# Patient Record
Sex: Male | Born: 1975 | Race: White | Hispanic: Yes | Marital: Married | Smoking: Never smoker
Health system: Southern US, Community
[De-identification: ages and names within clinical notes are randomized; demographics above are authoritative.]

## PROBLEM LIST (undated history)

## (undated) DIAGNOSIS — T8859XA Other complications of anesthesia, initial encounter: Secondary | ICD-10-CM

## (undated) DIAGNOSIS — T4145XA Adverse effect of unspecified anesthetic, initial encounter: Secondary | ICD-10-CM

## (undated) DIAGNOSIS — I10 Essential (primary) hypertension: Secondary | ICD-10-CM

## (undated) DIAGNOSIS — R7303 Prediabetes: Secondary | ICD-10-CM

## (undated) DIAGNOSIS — E785 Hyperlipidemia, unspecified: Secondary | ICD-10-CM

## (undated) DIAGNOSIS — K4090 Unilateral inguinal hernia, without obstruction or gangrene, not specified as recurrent: Secondary | ICD-10-CM

## (undated) HISTORY — PX: HERNIA REPAIR: SHX51

## (undated) HISTORY — DX: Essential (primary) hypertension: I10

---
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
## 2003-11-02 HISTORY — PX: INGUINAL HERNIA REPAIR: SHX194

## 2004-09-28 ENCOUNTER — Ambulatory Visit (HOSPITAL_BASED_OUTPATIENT_CLINIC_OR_DEPARTMENT_OTHER): Admission: RE | Admit: 2004-09-28 | Discharge: 2004-09-28 | Payer: Self-pay | Admitting: Surgery

## 2009-12-22 ENCOUNTER — Ambulatory Visit: Payer: Self-pay | Admitting: Physician Assistant

## 2009-12-22 DIAGNOSIS — B351 Tinea unguium: Secondary | ICD-10-CM | POA: Insufficient documentation

## 2009-12-22 DIAGNOSIS — L290 Pruritus ani: Secondary | ICD-10-CM

## 2010-01-02 ENCOUNTER — Encounter: Payer: Self-pay | Admitting: Physician Assistant

## 2010-01-02 LAB — CONVERTED CEMR LAB
Albumin: 4.5 g/dL (ref 3.5–5.2)
Barbiturate Quant, Ur: NEGATIVE
Basophils Relative: 0 % (ref 0–1)
CO2: 27 meq/L (ref 19–32)
Calcium: 9 mg/dL (ref 8.4–10.5)
Chloride: 102 meq/L (ref 96–112)
Creatinine,U: 92.1 mg/dL
Glucose, Bld: 88 mg/dL (ref 70–99)
Hemoglobin: 15.4 g/dL (ref 13.0–17.0)
Lymphocytes Relative: 37 % (ref 12–46)
MCHC: 33.8 g/dL (ref 30.0–36.0)
Methadone: NEGATIVE
Monocytes Absolute: 0.5 10*3/uL (ref 0.1–1.0)
Monocytes Relative: 7 % (ref 3–12)
Neutro Abs: 3.7 10*3/uL (ref 1.7–7.7)
Potassium: 4.1 meq/L (ref 3.5–5.3)
RBC: 5.22 M/uL (ref 4.22–5.81)
Sodium: 140 meq/L (ref 135–145)
TSH: 1.723 microintl units/mL (ref 0.350–4.500)
Total Protein: 6.9 g/dL (ref 6.0–8.3)

## 2010-02-03 ENCOUNTER — Ambulatory Visit: Payer: Self-pay | Admitting: Physician Assistant

## 2010-02-04 ENCOUNTER — Encounter: Payer: Self-pay | Admitting: Physician Assistant

## 2010-02-04 LAB — CONVERTED CEMR LAB
Cholesterol, target level: 200 mg/dL
Cholesterol: 212 mg/dL — ABNORMAL HIGH (ref 0–200)
HDL goal, serum: 40 mg/dL
LDL Goal: 160 mg/dL
VLDL: 39 mg/dL (ref 0–40)

## 2010-12-01 NOTE — Assessment & Plan Note (Signed)
Summary: NEW PT/ RECTUM PAIN/ GK   Vital Signs:  Patient profile:   35 year old male Height:      64 inches Weight:      172 pounds BMI:     29.63 Temp:     98.5 degrees F oral Pulse rate:   71 / minute Pulse rhythm:   regular Resp:     18 per minute BP sitting:   130 / 72  (left arm) Cuff size:   regular  Vitals Entered By: Arm[place] Shannon (December 22, 2009 4:28 PM) CC: NP...Marland KitchenMarland Kitchen pt is here for rectal pain.... pt says he has not seen any bleeding in his rectum...Marland KitchenMarland Kitchen pt says when he uses the bathroom he feels burning... pt says he has pain for two -three days...Marland Kitchen pt says he feels irration.... Is Patient Diabetic? No Pain Assessment Patient in pain? no       Does patient need assistance? Functional Status Self care Ambulation Normal   CC:  NP...Marland KitchenMarland Kitchen pt is here for rectal pain.... pt says he has not seen any bleeding in his rectum...Marland KitchenMarland Kitchen pt says when he uses the bathroom he feels burning... pt says he has pain for two -three days...Marland Kitchen pt says he feels irration.....  History of Present Illness: New patient. No prior medical care.  Here for anal itching and burning. Has noticed 4 x since Aug.   Did notice a "speck" of blood on his stool and on the tissue once. He denies feeling anything on his rectum.  Notices increased burning when he eats spicy foods.   Denies unintended weight loss. Denies night sweats or fevers. Does note some allergy symptoms.  Describes a cough when he lies down at times and notes increased drainage and sneezing.   Habits & Providers  Alcohol-Tobacco-Diet     Alcohol drinks/day: <1     Alcohol type: beer     Tobacco Status: quit     Year Quit: 2006     Pack years: < 1  Exercise-Depression-Behavior     Drug Use: never  Allergies (verified): No Known Drug Allergies  Past History:  Past Medical History: unremarkable  Past Surgical History: Right Inguinal Hernia repair  Family History: Family History Diabetes 1st degree relative - mom GM -  CAD no colon or prostate cancer  Social History: Occupation: Holiday representative Married 3 kids Former Smoker Occupation:  employed Smoking Status:  quit Drug Use:  never  Review of Systems  The patient denies fever, chest pain, syncope, dyspnea on exertion, prolonged cough, headaches, hemoptysis, melena, severe indigestion/heartburn, and hematuria.         Rest of ROS neg.  Physical Exam  General:  alert, well-developed, and well-nourished.   Head:  normocephalic and atraumatic.   Eyes:  pupils equal, pupils round, and pupils reactive to light.   pterygium noted bilat  Mouth:  pharynx pink and moist.   Neck:  supple and no cervical lymphadenopathy.   Lungs:  normal breath sounds, no crackles, and no wheezes.   Heart:  normal rate and regular rhythm.   Abdomen:  soft, non-tender, normal bowel sounds, and no hepatomegaly.   Rectal:  small hemorrhoid noted at 9 o'clock and 6 o'clock non inflammed no bleeding noted  Genitalia:  uncircumcised, no hydrocele, no varicocele, no scrotal masses, no testicular masses or atrophy, and no urethral discharge.   Prostate:  no gland enlargement, no nodules, and no asymmetry.   Msk:  onychomycosis noted right great toe Extremities:  no edema Neurologic:  alert & oriented X3 and cranial nerves II-XII intact.   Psych:  normally interactive.     Impression & Recommendations:  Problem # 1:  PRURITUS ANI (ICD-698.0)  suspect related to hemorrhoids very small on exam heme neg suggest increased fiber sitz baths as needed proctosol HC supp two times a day as needed   Orders: T-CBC w/Diff (09811-91478)  Problem # 2:  PREVENTIVE HEALTH CARE (ICD-V70.0)  Orders: T-CBC w/Diff ([id number]) T-Comprehensive Metabolic Panel (57846-96295) T-TSH [phone]) T-Drug Screen-Urine, (single) ([id number]) T-HIV Antibody  (Reflex) ([id number]) T-Syphilis Test (RPR) (38756-43329)  Complete Medication List: 1)  Anusol-hc 25 Mg Supp  (Hydrocortisone acetate) .Marland Kitchen.. 1 per rectum up to two times a day as needed  Patient Instructions: 1)  Increase fiber in diet. 2)  See handout. 3)  Use sitz bath as needed for burning or itching (sit in warm water in tub for 10-15 mins). 4)  Use suppositories as needed. 5)  Take antacid (pepcid or zantac) with spicy foods. 6)  Follow up sooner if you see more bleeding. 7)  Please schedule a follow-up appointment in 6 weeks with Scott.  8)  Come fasting to your next appointment for labs. Prescriptions: ANUSOL-HC 25 MG SUPP (HYDROCORTISONE ACETATE) 1 per rectum up to two times a day as needed  #1 box x 3   Entered and Authorized by:   Tereso Newcomer PA-C   Signed by:   Tereso Newcomer PA-C on 12/22/2009   Method used:   Print then Give to Patient   RxID:   [id number]   Prevention & Chronic Care Immunizations   Influenza vaccine: Not documented    Tetanus booster: Not documented    Pneumococcal vaccine: Not documented  Other Screening   Smoking status: quit  (12/22/2009)  Lipids   Total Cholesterol: Not documented   LDL: Not documented   LDL Direct: Not documented   HDL: Not documented   Triglycerides: Not documented

## 2010-12-01 NOTE — Letter (Signed)
Summary: Handout Printed  Printed Handout:  - Diet - High-Fiber 

## 2010-12-01 NOTE — Letter (Signed)
Summary: *HSN Results Follow up  [facility]  [address] [place], [place] [address]   Phone: [phone]  Fax: [phone]      01/02/2010   Austin Rubio [address] [place], [place]  [address]   Dear  Austin Rubio,                            ____S.Drinkard,FNP   ____D. Gore,FNP       ____B. McPherson,MD   ____V. Rankins,MD    ____E. Mulberry,MD    ____N. Daphine Deutscher, FNP  ____D. Reche Dixon, MD    ____K. Philipp Deputy, MD    __x__S. Alben Spittle, PA-C     This letter is to inform you that your recent test(s):  _______Pap Smear    ___x____Lab Test     _______X-ray    ___x____ is within acceptable limits  _______ requires a medication change  _______ requires a follow-up lab visit  _______ requires a follow-up visit with your provider   Comments:       _________________________________________________________ If you have any questions, please contact our office                     Sincerely,  Tereso Newcomer PA-C [facility]

## 2010-12-01 NOTE — Letter (Signed)
Summary: Lipid Letter  [facility]  [address] [place], [place] [address]   Phone: [phone]  Fax: [phone]    02/04/2010  Austin Rubio [address], [place]  [address]  Dear Austin Rubio:  We have carefully reviewed your last lipid profile from 02/03/2010 and the results are noted below with a summary of recommendations for lipid management.    Cholesterol:       212     Goal: <200   HDL "good" Cholesterol:   42     Goal: >40   LDL "bad" Cholesterol:   131     Goal: <160   Triglycerides:       196     Goal: <150    TLC Diet (Therapeutic Lifestyle Change): Saturated Fats & Transfatty acids should be kept < 7% of total calories ***Reduce Saturated Fats Polyunstaurated Fat can be up to 10% of total calories Monounsaturated Fat Fat can be up to 20% of total calories Total Fat should be no greater than 25-35% of total calories Carbohydrates should be 50-60% of total calories Protein should be approximately 15% of total calories Fiber should be at least 20-30 grams a day ***Increased fiber may help lower LDL Total Cholesterol should be < 200mg /day Consider adding plant stanol/sterols to diet (example: Benacol spread) ***A higher intake of unsaturated fat may reduce Triglycerides and Increase HDL    Adjunctive Measures (may lower LIPIDS and reduce risk of Heart Attack) include: Aerobic Exercise (20-30 minutes 3-4 times a week) Limit Alcohol Consumption Weight Reduction Dietary Fiber 20-30 grams a day by mouth    Current Medications: 1)    Anusol-hc 25 Mg Supp (Hydrocortisone acetate) .Marland Kitchen.. 1 per rectum up to two times a day as needed 2)    Lamisil 250 Mg Tabs (Terbinafine hcl) .... Take 1 tablet by mouth once a day for nail fungus (please write in spanish)  Please start taking Fish Oil 1000 mg daily and repeat labs in 6 months.  If you have any questions, please call.   Sincerely,    Tereso Newcomer, PA-C

## 2010-12-01 NOTE — Assessment & Plan Note (Signed)
Summary: 6 week f/u fasting/tmm   Vital Signs:  Patient profile:   35 year old male Height:      64 inches Weight:      173 pounds Temp:     97.7 degrees F oral Pulse rate:   62 / minute Pulse rhythm:   regular Resp:     18 per minute BP sitting:   125 / 78  (left arm) Cuff size:   regular  Vitals Entered By: Arm[place] Shannon (February 03, 2010 9:48 AM) CC: six week f/u.... Is Patient Diabetic? No Pain Assessment Patient in pain? no       Does patient need assistance? Functional Status Self care Ambulation Normal   Primary Care Provider:  Tereso Newcomer, PA-C  CC:  six week f/u.....  History of Present Illness: Here for f/u.  Pruritus ani:  Resolved.  Increased his fiber.  No bleeding.  Has not had to use suppository.  Onychomycosis: Right great toenail.  Interested in taking medicine.  Allergies:  Seasonal.  Taking OTC zyrtec with relief.    Habits & Providers  Alcohol-Tobacco-Diet     Alcohol drinks/day: 4-5 drinks on weekend     Alcohol type: beer  Problems Prior to Update: 1)  Onychomycosis  (ICD-110.1) 2)  Preventive Health Care  (ICD-V70.0) 3)  Pruritus Ani  (ICD-698.0) 4)  Family History Diabetes 1st Degree Relative  (ICD-V18.0)  Allergies (verified): No Known Drug Allergies  Physical Exam  General:  alert, well-developed, and well-nourished.   Head:  normocephalic and atraumatic.   Lungs:  normal breath sounds.   Heart:  normal rate and regular rhythm.   Extremities:  right foot: right great toenail and fifth toenail thickened and yellow c/w onychomycosis Neurologic:  alert & oriented X3 and cranial nerves II-XII intact.   Psych:  normally interactive.     Impression & Recommendations:  Problem # 1:  ONYCHOMYCOSIS (ICD-110.1)  start lamisil check LFTs in 6-8 weeks  His updated medication list for this problem includes:    Lamisil 250 Mg Tabs (Terbinafine hcl) .Marland Kitchen... Take 1 tablet by mouth once a day for nail fungus (please write in  spanish)  Problem # 2:  PRURITUS ANI (ICD-698.0) resolved  Problem # 3:  PREVENTIVE HEALTH CARE (ICD-V70.0)  get FLP today  Orders: T-Lipid Profile ([id number])  Complete Medication List: 1)  Anusol-hc 25 Mg Supp (Hydrocortisone acetate) .Marland Kitchen.. 1 per rectum up to two times a day as needed 2)  Lamisil 250 Mg Tabs (Terbinafine hcl) .... Take 1 tablet by mouth once a day for nail fungus (please write in spanish)  Patient Instructions: 1)  Td shot today. 2)  Return to the lab in 8 weeks for LFTs (Dx 110.1). 3)  Do not drink more than one beer a day while taking this medication.  It is best to not have a drink every day. Prescriptions: LAMISIL 250 MG TABS (TERBINAFINE HCL) Take 1 tablet by mouth once a day for nail fungus (please write in Spanish)  #30 x 2   Entered and Authorized by:   Tereso Newcomer PA-C   Signed by:   Tereso Newcomer PA-C on 02/03/2010   Method used:   Print then Give to Patient   RxID:   [id number]

## 2011-03-19 NOTE — Op Note (Signed)
NAME:  Austin Rubio, Austin Rubio                    ACCOUNT NO.:  [id number]   MEDICAL RECORD NO.:  [id number]          PATIENT TYPE:  AMB   LOCATION:  [facility]                          FACILITY:  [facility]   PHYSICIAN:  Thornton Park. Daphine Deutscher, MD  DATE OF BIRTH:  Sep 06, 1976   DATE OF PROCEDURE:  09/28/2004  DATE OF DISCHARGE:                                 OPERATIVE REPORT   PREOPERATIVE DIAGNOSIS:  Right inguinal hernia.   POSTOPERATIVE DIAGNOSIS:  Right direct inguinal hernia related to lifting  mortar bucket at work.   PROCEDURE:  Right inguinal herniorrhaphy with mesh.   SURGEON:  Thornton Park. Daphine Deutscher, MD   ANESTHESIA:  General.   DESCRIPTION OF PROCEDURE:  This is a 35 year old Hispanic male who sustained  the above-mentioned injury.  Informed consent was obtained was obtained  regarding repair of his inguinal hernia through a translator.  After  prepping the right inguinal region with Betadine and draping sterilely, an  oblique incision was made and carried down to the external oblique.  This  was cleaned down to the ring when opened.  The cord was mobilized with a  Penrose drain placed around it.  A very large direct defect was present,  occupying the entire floor.  This was closed by imbricating the  transversalis fascia with a running 2-0 Prolene.  Once this was completed, a  piece of mesh was cut to fit and sutured along the inguinal ligament with a  running 2-0 Prolene and medially to the internal oblique with running 2-0  Prolene.  It was cut and brought around the cord and anchored to itself with  a single horizontal mattress suture of 2-0 Prolene.  This was tucked beneath  the external oblique, which was then closed with a running 2-0 Vicryl.  The  area was then infiltrated with lidocaine and was closed with 4-0 Vicryl and  with a running subcuticular 5-0 Vicryl.  The patient seemed to tolerate the  procedure well.  He will be given Tylox to take for pain.  He will be  followed up in the office  in about three weeks.      Matt   MBM/MEDQ  D:  09/28/2004  T:  09/28/2004  Job:  [id number]   cc:   Dr. Jill Alexanders, [facility]

## 2011-04-06 ENCOUNTER — Inpatient Hospital Stay (INDEPENDENT_AMBULATORY_CARE_PROVIDER_SITE_OTHER)
Admission: RE | Admit: 2011-04-06 | Discharge: 2011-04-06 | Disposition: A | Discharge status: Home / Self Care | Payer: Self-pay | Source: Ambulatory Visit | Attending: Family Medicine | Admitting: Family Medicine

## 2011-04-06 DIAGNOSIS — F411 Generalized anxiety disorder: Secondary | ICD-10-CM

## 2011-04-06 DIAGNOSIS — F4321 Adjustment disorder with depressed mood: Secondary | ICD-10-CM

## 2011-09-30 ENCOUNTER — Encounter: Payer: Self-pay | Admitting: Emergency Medicine

## 2011-09-30 ENCOUNTER — Emergency Department (INDEPENDENT_AMBULATORY_CARE_PROVIDER_SITE_OTHER)
Admission: EM | Admit: 2011-09-30 | Discharge: 2011-09-30 | Disposition: A | Discharge status: Home / Self Care | Payer: Self-pay | Source: Home / Self Care | Attending: Family Medicine | Admitting: Family Medicine

## 2011-09-30 DIAGNOSIS — F419 Anxiety disorder, unspecified: Secondary | ICD-10-CM

## 2011-09-30 DIAGNOSIS — F411 Generalized anxiety disorder: Secondary | ICD-10-CM

## 2011-09-30 DIAGNOSIS — F329 Major depressive disorder, single episode, unspecified: Secondary | ICD-10-CM

## 2011-09-30 MED ORDER — ALPRAZOLAM 1 MG PO TABS: 1.0000 mg | ORAL_TABLET | Freq: Two times a day (BID) | ORAL | Status: DC | PRN

## 2011-09-30 NOTE — ED Notes (Signed)
Pt here with c/o weakness,fatigue and dizziness and inability to sleep for the past week and dry mouth,sweaty palms and neck pain.pt was seen here for same sx and prescribed xanax but states it helps me sleep but sx restarts again.

## 2011-09-30 NOTE — ED Provider Notes (Signed)
History     CSN: [id number] Arrival date & time: 09/30/2011  6:36 PM   First MD Initiated Contact with Patient 09/30/11 1655      Chief Complaint  Patient presents with  . Dizziness  . Fatigue  . New Evaluation    (Consider location/radiation/quality/duration/timing/severity/associated sxs/prior treatment) HPI Comments: Austin Rubio presents with his family for evaluation of intermittent dizziness, sweaty palms, decreased sleep, fatigue. He reports onset of symptoms in June when he lost his father, was given Xanax, which helped some. His daughter is interpreting. She reports that he also complains of crying spells, especially when the family leaves the house. He is afraid of being alone. He also gets nervous when there are too many people around him.   Patient is a 35 y.o. male presenting with neurologic complaint. The history is provided by the patient and a relative. The history is limited by a language barrier. A language interpreter was used.  Neurologic Problem The primary symptoms include dizziness. The symptoms are unchanged. The symptoms occurred after standing up (depression since the death of his father).  He describes the dizziness as faintness. The dizziness began more than 1 week ago. The dizziness has been unchanged since its onset. The dizziness is associated with emotional stress. Dizziness also occurs with blurred vision and diaphoresis.  Additional symptoms include anxiety and dysphoric mood.    History reviewed. No pertinent past medical history.  History reviewed. No pertinent past surgical history.  History reviewed. No pertinent family history.  History  Substance Use Topics  . Smoking status: Never Smoker   . Smokeless tobacco: Not on file  . Alcohol Use: No      Review of Systems  Constitutional: Positive for diaphoresis.  HENT: Negative.   Eyes: Positive for blurred vision.  Respiratory: Negative.   Cardiovascular: Negative.   Gastrointestinal:  Negative.   Genitourinary: Negative for dysuria, urgency and frequency.  Musculoskeletal: Negative.   Neurological: Positive for dizziness.  Psychiatric/Behavioral: Positive for sleep disturbance, dysphoric mood, decreased concentration and agitation. The patient is nervous/anxious.     Allergies  Review of patient's allergies indicates no known allergies.  Home Medications   Current Outpatient Rx  Name Route Sig Dispense Refill  . ALPRAZOLAM 1 MG PO TABS Oral Take 1 mg by mouth at bedtime as needed.        BP 172/87  Pulse 77  Temp(Src) 98.2 F (36.8 C) (Oral)  Resp 18  SpO2 100%  Physical Exam  Nursing note and vitals reviewed. Constitutional: He is oriented to person, place, and time. He appears well-developed and well-nourished.  HENT:  Head: Normocephalic and atraumatic.  Right Ear: Tympanic membrane and external ear normal.  Left Ear: Tympanic membrane and external ear normal.  Mouth/Throat: Uvula is midline, oropharynx is clear and moist and mucous membranes are normal.  Eyes: EOM are normal. Pupils are equal, round, and reactive to light.  Neck: Normal range of motion.  Cardiovascular: Normal rate and regular rhythm.   Pulmonary/Chest: Effort normal and breath sounds normal.  Abdominal: Soft.  Musculoskeletal: Normal range of motion.  Neurological: He is alert and oriented to person, place, and time. He displays a negative Romberg sign. Gait normal.  Skin: Skin is warm and dry.  Psychiatric: He has a normal mood and affect. His behavior is normal. Judgment and thought content normal.    ED Course  Procedures (including critical care time)   Labs Reviewed  POCT CBG MONITORING   No results found.   No  diagnosis found.    MDM          Richardo Priest, MD 09/30/11 [phone]

## 2012-01-30 ENCOUNTER — Emergency Department (HOSPITAL_COMMUNITY)
Admission: EM | Admit: 2012-01-30 | Discharge: 2012-01-30 | Disposition: A | Discharge status: Home / Self Care | Payer: Self-pay | Source: Home / Self Care

## 2012-01-30 ENCOUNTER — Encounter (HOSPITAL_COMMUNITY): Payer: Self-pay

## 2012-01-30 DIAGNOSIS — G5600 Carpal tunnel syndrome, unspecified upper limb: Secondary | ICD-10-CM

## 2012-01-30 DIAGNOSIS — J309 Allergic rhinitis, unspecified: Secondary | ICD-10-CM

## 2012-01-30 DIAGNOSIS — L723 Sebaceous cyst: Secondary | ICD-10-CM

## 2012-01-30 LAB — POCT RAPID STREP A: Streptococcus, Group A Screen (Direct): NEGATIVE

## 2012-01-30 NOTE — ED Notes (Signed)
Pt has sorethroat for one month with headache.  He also has tender dime size spot on lt chest for 6 months.

## 2012-01-30 NOTE — ED Provider Notes (Signed)
Medical screening examination/treatment/procedure(s) were performed by non-physician practitioner and as supervising physician I was immediately available for consultation/collaboration.  Raynald Blend, MD 01/30/12 2025

## 2012-01-30 NOTE — Discharge Instructions (Signed)
Use warm compresses to chest several times a day to help heal the cyst.    Use over the counter CLARITIN (or LORATIDINE) one pill every day to help relieve your sore throat, seasonal congestion.    Use the wrist splint when working and sleeping and whenever your arm feels numb.

## 2012-01-30 NOTE — ED Provider Notes (Signed)
History     CSN: [id number]  Arrival date & time 01/30/12  1707   None     Chief Complaint  Patient presents with  . Sore Throat    (Consider location/radiation/quality/duration/timing/severity/associated sxs/prior treatment) HPI Comments: Pt with multiple complaints.  First, has sore throat for a month, similar sx have happened in the past, associated with weather season changes.  Worst at night, not bad during day.  Also c/o numbness in L hand/forearm from time to time.  Sometimes when working, sometimes at night when sleeping, sometimes at other times of day.  Feels like arm/hand is "dormido" or asleep.  Also complains of lesion on chest for 6 months, unchanged, doesn't hurt unless it's pushed on.    Patient is a 36 y.o. male presenting with pharyngitis.  Sore Throat This is a new problem. Episode onset: a month ago. The problem occurs daily. The problem has not changed since onset.Pertinent negatives include no chest pain and no shortness of breath. The symptoms are aggravated by nothing. The symptoms are relieved by nothing. He has tried nothing for the symptoms.    History reviewed. No pertinent past medical history.  Past Surgical History  Procedure Date  . Hernia repair     History reviewed. No pertinent family history.  History  Substance Use Topics  . Smoking status: Never Smoker   . Smokeless tobacco: Not on file  . Alcohol Use: No      Review of Systems  Constitutional: Negative for fever, chills and appetite change.  HENT: Positive for congestion, sore throat, rhinorrhea, postnasal drip and sinus pressure. Negative for ear pain.   Respiratory: Negative for cough and shortness of breath.   Cardiovascular: Negative for chest pain.  Musculoskeletal: Negative for myalgias and joint swelling.       No joint pain  Skin: Negative for rash and wound.       Spot on chest  Neurological: Positive for numbness.       Wrist/hand/forearm "asleep feeling" sometimes.       Allergies  Review of patient's allergies indicates no known allergies.  Home Medications   Current Outpatient Rx  Name Route Sig Dispense Refill  . ALPRAZOLAM 1 MG PO TABS Oral Take 1 tablet (1 mg total) by mouth 2 (two) times daily as needed for sleep or anxiety. 30 tablet 0    BP 142/86  Pulse 69  Temp(Src) 98.4 F (36.9 C) (Oral)  Resp 18  SpO2 96%  Physical Exam  Constitutional: He appears well-developed and well-nourished. No distress.  HENT:  Head: Macrocephalic.  Right Ear: Tympanic membrane, external ear and ear canal normal.  Left Ear: Tympanic membrane, external ear and ear canal normal.  Nose: Mucosal edema and rhinorrhea present.  Mouth/Throat: Oropharynx is clear and moist.  Cardiovascular: Normal rate and regular rhythm.   Pulmonary/Chest: Effort normal and breath sounds normal. He exhibits mass. He exhibits no tenderness.       See skin exam  Lymphadenopathy:       Head (right side): No submental, no submandibular and no tonsillar adenopathy present.       Head (left side): No submental, no submandibular and no tonsillar adenopathy present.    He has no cervical adenopathy.  Neurological:       postive tinnel's sign L hand/forearm, c/w carpal tunnel  Skin: Skin is warm and dry.       Small 4-2mm nodule chest wall, nontender, no redness or warmth, firm to palp, c/w sebaceous cyst.  ED Course  Procedures (including critical care time)   Labs Reviewed  POCT RAPID STREP A ([facility] CARE ONLY)   No results found.   1. Carpal tunnel syndrome   2. Sebaceous cyst   3. Allergic rhinitis       MDM          Cathlyn Parsons, NP 01/30/12 2025

## 2013-05-17 ENCOUNTER — Ambulatory Visit: Payer: No Typology Code available for payment source | Attending: Family Medicine | Admitting: Family Medicine

## 2013-05-17 VITALS — BP 184/116 | HR 67 | Temp 98.8°F | Resp 16 | Ht 65.0 in | Wt 197.0 lb

## 2013-05-17 DIAGNOSIS — K5909 Other constipation: Secondary | ICD-10-CM

## 2013-05-17 DIAGNOSIS — K649 Unspecified hemorrhoids: Secondary | ICD-10-CM

## 2013-05-17 DIAGNOSIS — K5900 Constipation, unspecified: Secondary | ICD-10-CM

## 2013-05-17 DIAGNOSIS — I10 Essential (primary) hypertension: Secondary | ICD-10-CM | POA: Insufficient documentation

## 2013-05-17 DIAGNOSIS — K648 Other hemorrhoids: Secondary | ICD-10-CM | POA: Insufficient documentation

## 2013-05-17 HISTORY — DX: Essential (primary) hypertension: I10

## 2013-05-17 MED ORDER — HYDROCORTISONE ACETATE 25 MG RE SUPP: 25.0000 mg | Freq: Two times a day (BID) | RECTAL | Status: DC | PRN

## 2013-05-17 MED ORDER — HYDROCHLOROTHIAZIDE 12.5 MG PO TABS: 12.5000 mg | ORAL_TABLET | Freq: Every day | ORAL | Status: DC

## 2013-05-17 MED ORDER — POLYETHYLENE GLYCOL 3350 17 GM/SCOOP PO POWD: 17.0000 g | Freq: Two times a day (BID) | ORAL | Status: DC | PRN

## 2013-05-17 NOTE — Progress Notes (Signed)
Patient presents to establish care with complaints of hemorrhoidal pain for the past 2 years; pain has gotten worse and patient states he has been taking his anti anxiety medication, Xanax, to cope with the pain. States he was given a suppository when at his last clinic over a year ago but has run out.

## 2013-05-17 NOTE — Patient Instructions (Addendum)
Constipacin - Adulto  (Constipation, Adult)  Constipacin significa que una persona tiene menos de 3 evacuaciones en una semana, hay dificultad para evacuar el intestino, o las heces son secas, duras, o ms grandes que lo normal. A medida que envejecemos el estreimiento es ms comn. Si intenta curar el estreimiento con medicamentos que producen la evacuacin intestinal (laxantes), el problema puede empeorar. El uso prolongado de laxantes puede hacer que los msculos del colon se debiliten. Una dieta baja en fibra, no tomar suficientes lquidos y el uso de ciertos medicamentos pueden Agricultural engineer.  CAUSAS   Ciertos medicamentos, como los antidepresivos, analgsicos, suplementos de hierro, anticidos y diurticos.   Algunas enfermedades, como la diabetes, el sndrome del colon irritable (SII), enfermedad de la tiroides, o depresin.   No beber suficiente agua.   No consumir suficientes alimentos ricos en fibra.   Situaciones de estrs o viajes.  Falta de actividad fsica o de ejercicio.  No ir al bao cuando siente la necesidad.  Ignorar la necesidad sbita de Film/video editor intestino.  Uso en exceso de laxantes. SNTOMAS   Evacuar el intestino menos de 3 veces a la semana.   Dificultad para mover el [place] y duras, o ms grandes que las normales.   Sensacin de estar lleno o distendido.   Dolor en la parte baja del abdomen  No se siente alivio despus de evacuar el intestino. DIAGNSTICO  El mdico le har una historia clnica y le har un examen fsico. Pueden hacerle exmenes adicionales para el estreimiento grave. Algunas pruebas son:   Un radiografa con enema de bario para examinar el recto, el colon y en algunos casos el intestino delgado.  Una sigmoidoscopia para examinar el colon inferior.  Una colonoscopia para examinar todo el colon. TRATAMIENTO  El tratamiento depender de la gravedad de la constipacin y de la  causa. Algunos tratamientos dietticos son beber ms lquidos y comer ms alimentos ricos en fibra. El cambio en el estilo de vida incluye hacer ejercicios de [place] regular. Si estas recomendaciones para Animator dieta y en el estilo de vida no ayudan, el mdico le puede indicar el uso de laxantes de venta libre para Industrial/product designer movimiento intestinal. Los medicamentos con Statistician se pueden prescribir si los medicamentos de venta libre no lo mejoran.  INSTRUCCIONES PARA EL CUIDADO EN EL HOGAR   Aumente el consumo de alimentos con [place], como frutas, verduras, granos enteros y frijoles. Limite los azcares ricos en grasas y procesados   en su dieta, tales como papas fritas, hamburguesas, galletas, dulces y refrescos.   Puede agregar un suplemento de fibra a su dieta si no obtiene lo suficiente de los alimentos.   Debe ingerir gran cantidad de lquido para mantener la orina de tono claro o color amarillo plido.   Haga ejercicios regularmente o segn las indicaciones de su mdico.   Vaya al bao cuando sienta la necesidad de ir. No espere.  Tome slo la medicacin que le indic el profesional.  No tome otros medicamentos para la constipacin sin Teacher, adult education a su mdico. SOLICITE ATENCIN MDICA DE INMEDIATO SI:   Observa sangre brillante en las heces.   La constipacin dura ms de 4 das o empeora.   Siente dolor abdominal o rectal.   Las heces son delgadas como un lpiz.  Pierde peso de [place] inexplicable. ASEGRESE DE QUE:   Comprende estas instrucciones.  Controlar su enfermedad.  Solicitar ayuda de inmediato si  no mejora o empeora. Document Released: 11/07/2007 Document Revised: 04/18/2012 [facility] Patient Information 2014 [place], [place].  Hemorroides  (Hemorrhoids) Las hemorroides son venas inflamadas alrededor del recto o del ano. Hay dos tipos de hemorroides:   Hemorroides internas. Aparecen en las venas del interior del recto. Pueden abultarse hacia  el exterior e irritarse y Cabin crew.  Hemorroides externas. Se producen en las venas externas al ano y pueden sentirse como un bulto o zona hinchada dura y dolorosa cerca del ano. CAUSAS   Embarazo.   Obesidad.   Constipacin o diarrea.   Dificultad para mover el intestino.   Permanecer sentado durante largos perodos en el inodoro.  Levantar pesas u otras actividades que impliquen esfuerzo.  Sexo anal. SNTOMAS   Dolor.   Picazn o irritacin anal.   Sangrado rectal.   Prdida fecal.   Hinchazn anal.   Uno o ms bultos en la zona anal.  DIAGNSTICO  El mdico puede diagnosticar las hemorroides mediante un examen visual. Otros estudios o anlisis que se pueden realizar son:   Examen de la zona rectal con [place]om[place] Dear mano enguantada (examen digital rectal).   Examen de canal anal usando un pequeo tubo (endoscopio).   Anlisis de sangre si ha perdido [place] cantidad significativa de [place].  Estudio para observar el interior del colon (sigmoidoscopa o colonoscopa). TRATAMIENTO  La mayora de las hemorroides pueden tratarse en casa. Sin embargo, si los sntomas no mejoran o tiene Runner, broadcasting/film/video, el mdico puede Education officer, environmental un procedimiento para disminuir las hemorroides o extirparlas completamente. Los tratamientos posibles son:   Colocacin de una banda de goma en la base de la hemorroide para cortar la circulacin (ligadura con Curator).   Inyeccin de una sustancia qumica para reducir la hemorroide (escleroterapia).   Utilizacin de un instrumento para quemar la hemorroide (terapia con luz infrarroja).   Extirpacin quirrgica de las hemorroides (hemorroidectoma).   Colocacin de grapas en la hemorroide para bloquear el flujo de sangre a los tejidos (engrapado de hemorroides).  INSTRUCCIONES PARA EL CUIDADO EN EL HOGAR   Consuma alimentos con fibra, como cereales integrales, legumbres, frutos secos, frutas y verduras. Pregntele a su mdico acerca  de tomar productos con fibra aadida en ellos (suplementos defibra).  Aumente la ingesta de lquidos. Beba gran cantidad de lquido para mantener la orina de tono claro o color amarillo plido.   Haga ejercicios regularmente.   Vaya al bao cuando sienta la necesidad de mover el intestino. No espere.   Evite hacer fuerza al mover el intestino.   Mantenga la zona anal limpia y seca. Use papel higinico hmedo o toallitas humedecidas despus de mover el intestino.   Puede usar o Contractor segn las indicaciones algunas cremas especiales y supositorios.   Tome slo medicamentos de venta libre o recetados, segn las indicaciones del mdico.   Tome baos de asiento tibios durante 15 a 20 minutos, 3 a 4 veces por da para Primary school teacher y las [place].   Coloque una bolsa de hielo sobre las hemorroides si le duelen o se hinchan. Usar las compresas de [employer] baos de asiento puede ser [place].   Ponga el hielo en una bolsa plstica.   Colquese una toalla entre la piel y la bolsa de hielo.   Deje el hielo durante 15 a 20 minutos y aplquelo 3 a 4 veces por da.   No utilice una almohada en forma de aro ni se siente en el inodoro durante perodos prolongados. Esto aumenta la afluencia  de sangre y Chief Technology Officer.  SOLICITE ATENCIN MDICA SI:   Aumenta el dolor y la hinchazn y no puede controlarlo con la medicacin o con Pharmacist, community.  Tiene un sangrado que no puede parar.  No puede mover el intestino.  Siente dolor o tiene inflamacin fuera de la zona de las hemorroides. ASEGRESE DE QUE:   Comprende estas instrucciones.  Controlar su enfermedad.  Solicitar ayuda de inmediato si no mejora o si empeora. Document Released: 10/18/2005 Document Revised: 10/04/2012 [facility] Patient Information 2014 [place], [place].  Bao de Asiento   Un bao de asiento es un bao de agua caliente tomado en posicin de sentado. El agua debe cubrir las caderas y las nalgas.  Puede utilizarse para propsitos de higiene o curacin. Los baos de asiento a menudo se Insurance risk surveyor para Engineer, materials, picazn o espasmos musculares. El agua podr contener [employer]. El calor hmedo lo ayudar a curarse y relajarse.  CUIDADOS EN EL HOGAR  Tome de 3 a 4 baos de Insurance claims handler.  1. Llene la baadera hasta la mitad con agua caliente. 2. Sintese en el agua y abra un poco el desage. 3. [place] agua caliente para mantener la baadera llena hasta la mitad. Deje el agua corriendo de Pandora constante. 4. Sumrjase en el agua por 15 a 20 minutos. 5. Luego del bao de asiento, seque primero la parte afectada. SOLICITE AYUDA DE INMEDIATO SI:  Empeora en lugar de mejorar. Suspenda los baos de asiento si ve que [place].  ASEGRESE DE QUE:   Comprende estas instrucciones.  Controlar su enfermedad.  Solicitar ayuda de inmediato si no mejora o si empeora. Document Released: 03/22/2011 Document Revised: 07/12/2012 [facility] Patient Information 2014 [place], [place].

## 2013-05-17 NOTE — Progress Notes (Signed)
Patient ID: Austin Rubio, male   DOB: May 18, 1976, 37 y.o.   MRN: [id number]  CC: establish  Interpreter used to speak with patient today  HPI: Pt having hemorrhoid problem for several years and constipation.  Occasional blood with a BM.   No Known Allergies Past Medical History  Diagnosis Date  . Unspecified essential hypertension 05/17/2013   Current Outpatient Prescriptions on File Prior to Visit  Medication Sig Dispense Refill  . ALPRAZolam (XANAX) 1 MG tablet Take 1 tablet (1 mg total) by mouth 2 (two) times daily as needed for sleep or anxiety.  30 tablet  0   No current facility-administered medications on file prior to visit.   Family History  Problem Relation Age of Onset  . Diabetes Mother   . Heart disease Father    History   Social History  . Marital Status: Married    Spouse Name: N/A    Number of Children: N/A  . Years of Education: N/A   Occupational History  . Not on file.   Social History Main Topics  . Smoking status: Never Smoker   . Smokeless tobacco: Not on file  . Alcohol Use: No  . Drug Use: No  . Sexually Active: Not on file   Other Topics Concern  . Not on file   Social History Narrative  . No narrative on file    Review of Systems  Constitutional: Negative for fever, chills, diaphoresis, activity change, appetite change and fatigue.  HENT: Negative for ear pain, nosebleeds, congestion, facial swelling, rhinorrhea, neck pain, neck stiffness and ear discharge.   Eyes: Negative for pain, discharge, redness, itching and visual disturbance.  Respiratory: Negative for cough, choking, chest tightness, shortness of breath, wheezing and stridor.   Cardiovascular: Negative for chest pain, palpitations and leg swelling.  Gastrointestinal: Negative for abdominal distention.  Genitourinary: Negative for dysuria, urgency, frequency, hematuria, flank pain, decreased urine volume, difficulty urinating and dyspareunia.  Musculoskeletal: Negative for  back pain, joint swelling, arthralgias and gait problem.  Neurological: Negative for dizziness, tremors, seizures, syncope, facial asymmetry, speech difficulty, weakness, light-headedness, numbness and headaches.  Hematological: Negative for adenopathy. Does not bruise/bleed easily.  Psychiatric/Behavioral: Negative for hallucinations, behavioral problems, confusion, dysphoric mood, decreased concentration and agitation.    Objective:   Filed Vitals:   05/17/13 1609  BP: 182/100  Pulse: 67  Temp: 98.8 F (37.1 C)  Resp: 16    Physical Exam  Constitutional: Appears well-developed and well-nourished. No distress.  HENT: Normocephalic. External right and left ear normal. Oropharynx is clear and moist.  Eyes: Conjunctivae and EOM are normal. PERRLA, no scleral icterus.  Neck: Normal ROM. Neck supple. No JVD. No tracheal deviation. No thyromegaly.  CVS: RRR, S1/S2 +, no murmurs, no gallops, no carotid bruit.  Pulmonary: Effort and breath sounds normal, no stridor, rhonchi, wheezes, rales.  Abdominal: Soft. BS +,  no distension, tenderness, rebound or guarding. Rectal: no external hemorrhoids seen, palpated painful swollen internal hemorrhoid, nonthrombosed  Musculoskeletal: Normal range of motion. No edema and no tenderness.  Lymphadenopathy: No lymphadenopathy noted, cervical, inguinal. Neuro: Alert. Normal reflexes, muscle tone coordination. No cranial nerve deficit. Skin: Skin is warm and dry. No rash noted. Not diaphoretic. No erythema. No pallor.  Psychiatric: Normal mood and affect. Behavior, judgment, thought content normal.   Lab Results  Component Value Date   WBC 7.0 12/22/2009   HGB 15.4 12/22/2009   HCT 45.5 12/22/2009   MCV 87.2 12/22/2009   PLT 276 12/22/2009  Lab Results  Component Value Date   CREATININE 1.13 12/22/2009   BUN 20 12/22/2009   NA 140 12/22/2009   K 4.1 12/22/2009   CL 102 12/22/2009   CO2 27 12/22/2009    No results found for this basename: HGBA1C    Lipid Panel     Component Value Date/Time   CHOL 212* 02/03/2010 2239   TRIG 196* 02/03/2010 2239   HDL 42 02/03/2010 2239   CHOLHDL 5.0 Ratio 02/03/2010 2239   VLDL 39 02/03/2010 2239   LDLCALC 131* 02/03/2010 2239       Assessment and plan:   Patient Active Problem List   Diagnosis Date Noted  . Unspecified essential hypertension 05/17/2013  . Chronic constipation 05/17/2013  . Hemorrhoids 05/17/2013  . ONYCHOMYCOSIS 12/22/2009  . PRURITUS ANI 12/22/2009   Internal hemorrhoid, nonthrombosed Recommend sitz baths, Anusol HC suppositories  Chronic constipation  miralax daily  Unspecified essential hypertension - Plan: COMPLETE METABOLIC PANEL WITH GFR, HgB A1c, Lipid panel  Chronic constipation - Plan: COMPLETE METABOLIC PANEL WITH GFR, HgB A1c, Lipid panel  Hemorrhoids - Plan: COMPLETE METABOLIC PANEL WITH GFR, HgB A1c, Lipid panel  RTC in 1 month  The patient was given clear instructions to go to ER or return to medical center if symptoms don't improve, worsen or new problems develop.  The patient verbalized understanding.  The patient was told to call to get any lab results if not heard anything in the next week.    Rodney Langton, MD, CDE, FAAFP [facility] [place], [place]

## 2013-05-18 ENCOUNTER — Telehealth: Payer: Self-pay | Admitting: *Deleted

## 2013-05-18 LAB — COMPLETE METABOLIC PANEL WITH GFR
ALT: 58 U/L — ABNORMAL HIGH (ref 0–53)
Alkaline Phosphatase: 79 U/L (ref 39–117)
GFR, Est Non African American: >89 mL/min
Sodium: 137 mEq/L (ref 135–145)
Total Bilirubin: 0.5 mg/dL (ref 0.3–1.2)
Total Protein: 7.4 g/dL (ref 6.0–8.3)

## 2013-05-18 LAB — LIPID PANEL
HDL: 39 mg/dL — ABNORMAL LOW (ref >39)
LDL Cholesterol: 116 mg/dL — ABNORMAL HIGH (ref 0–99)
Triglycerides: 275 mg/dL — ABNORMAL HIGH (ref <150)
VLDL: 55 mg/dL — ABNORMAL HIGH (ref 0–40)

## 2013-05-18 NOTE — Progress Notes (Signed)
Quick Note:  Please inform patient that his labs reveal that his cholesterol levels are elevated. I recommend a low fat low cholesterol diet and exercise 5 times per week. Also, his liver enzyme was mildly elevated. REcommend rechecking labs again in 4 months.   Rodney Langton, MD, CDE, FAAFP [facility] [place], [place]   ______

## 2013-05-18 NOTE — Telephone Encounter (Signed)
05/18/13 Patient made aware of lab results cholesterol level elevated. Recommend low fat low cholesterol diet. Patient made aware to rxercise 5 times per week. Liver enzymes mildly elevated. Recommend recheck in 4 months. P.Chanice Brenton,RN BSN [facility]

## 2013-06-22 ENCOUNTER — Ambulatory Visit: Payer: No Typology Code available for payment source | Attending: Family Medicine | Admitting: Internal Medicine

## 2013-06-22 ENCOUNTER — Encounter: Payer: Self-pay | Admitting: Internal Medicine

## 2013-06-22 VITALS — BP 152/79 | HR 76 | Temp 98.2°F | Resp 18 | Ht 67.0 in | Wt 187.0 lb

## 2013-06-22 DIAGNOSIS — I10 Essential (primary) hypertension: Secondary | ICD-10-CM | POA: Insufficient documentation

## 2013-06-22 DIAGNOSIS — E785 Hyperlipidemia, unspecified: Secondary | ICD-10-CM | POA: Insufficient documentation

## 2013-06-22 NOTE — Progress Notes (Signed)
Patient ID: Austin Rubio, male   DOB: Jun 09, 1976, 37 y.o.   MRN: [id number]   CC: Followup  HPI: Patient is 37 years old man here for followup. He also wants to know the results of his lab. Tests. No complaints today. He claims has been doing vigorous exercise, and because of that not taking his medication. He thinks he will use the exercise to control his blood pressure. His lab results were reviewed with him today.  No Known Allergies Past Medical History  Diagnosis Date  . Unspecified essential hypertension 05/17/2013   Current Outpatient Prescriptions on File Prior to Visit  Medication Sig Dispense Refill  . ALPRAZolam (XANAX) 1 MG tablet Take 1 tablet (1 mg total) by mouth 2 (two) times daily as needed for sleep or anxiety.  30 tablet  0  . hydrochlorothiazide (HYDRODIURIL) 12.5 MG tablet Take 1 tablet (12.5 mg total) by mouth daily.  30 tablet  3  . hydrocortisone (ANUSOL-HC) 25 MG suppository Place 1 suppository (25 mg total) rectally 2 (two) times daily as needed for hemorrhoids.  12 suppository  1  . polyethylene glycol powder (GLYCOLAX/MIRALAX) powder Take 17 g by mouth 2 (two) times daily as needed (constipation).  3350 g  1   No current facility-administered medications on file prior to visit.   Family History  Problem Relation Age of Onset  . Diabetes Mother   . Heart disease Father    History   Social History  . Marital Status: Married    Spouse Name: N/A    Number of Children: N/A  . Years of Education: N/A   Occupational History  . Not on file.   Social History Main Topics  . Smoking status: Never Smoker   . Smokeless tobacco: Not on file  . Alcohol Use: No  . Drug Use: No  . Sexual Activity: Not on file   Other Topics Concern  . Not on file   Social History Narrative  . No narrative on file    Review of Systems: Constitutional: Negative for fever, chills, diaphoresis, activity change, appetite change and fatigue. HENT: Negative for ear pain,  nosebleeds, congestion, facial swelling, rhinorrhea, neck pain, neck stiffness and ear discharge.  Eyes: Negative for pain, discharge, redness, itching and visual disturbance. Respiratory: Negative for cough, choking, chest tightness, shortness of breath, wheezing and stridor.  Cardiovascular: Negative for chest pain, palpitations and leg swelling. Gastrointestinal: Negative for abdominal distention. Genitourinary: Negative for dysuria, urgency, frequency, hematuria, flank pain, decreased urine volume, difficulty urinating and dyspareunia.  Musculoskeletal: Negative for back pain, joint swelling, arthralgias and gait problem. Neurological: Negative for dizziness, tremors, seizures, syncope, facial asymmetry, speech difficulty, weakness, light-headedness, numbness and headaches.  Hematological: Negative for adenopathy. Does not bruise/bleed easily. Psychiatric/Behavioral: Negative for hallucinations, behavioral problems, confusion, dysphoric mood, decreased concentration and agitation.    Objective:   Filed Vitals:   06/22/13 1617  BP: 152/79  Pulse: 76  Temp: 98.2 F (36.8 C)  Resp: 18    Physical Exam: Constitutional: Patient appears well-developed and well-nourished. No distress. HENT: Normocephalic, atraumatic, External right and left ear normal. Oropharynx is clear and moist.  Eyes: Conjunctivae and EOM are normal. PERRLA, no scleral icterus. Neck: Normal ROM. Neck supple. No JVD. No tracheal deviation. No thyromegaly. CVS: RRR, S1/S2 +, no murmurs, no gallops, no carotid bruit.  Pulmonary: Effort and breath sounds normal, no stridor, rhonchi, wheezes, rales.  Abdominal: Soft. BS +,  no distension, tenderness, rebound or guarding.  Musculoskeletal: Normal range of  motion. No edema and no tenderness.  Lymphadenopathy: No lymphadenopathy noted, cervical, inguinal or axillary Neuro: Alert. Normal reflexes, muscle tone coordination. No cranial nerve deficit. Skin: Skin is warm and  dry. No rash noted. Not diaphoretic. No erythema. No pallor. Psychiatric: Normal mood and affect. Behavior, judgment, thought content normal.  Lab Results  Component Value Date   WBC 7.0 12/22/2009   HGB 15.4 12/22/2009   HCT 45.5 12/22/2009   MCV 87.2 12/22/2009   PLT 276 12/22/2009   Lab Results  Component Value Date   CREATININE 1.02 05/17/2013   BUN 17 05/17/2013   NA 137 05/17/2013   K 3.9 05/17/2013   CL 100 05/17/2013   CO2 27 05/17/2013    Lab Results  Component Value Date   HGBA1C 5.5% 05/17/2013   Lipid Panel     Component Value Date/Time   CHOL 210* 05/17/2013 1653   TRIG 275* 05/17/2013 1653   HDL 39* 05/17/2013 1653   CHOLHDL 5.4 05/17/2013 1653   VLDL 55* 05/17/2013 1653   LDLCALC 116* 05/17/2013 1653       Assessment and plan:   Patient Active Problem List   Diagnosis Date Noted  . Dyslipidemia 06/22/2013  . Unspecified essential hypertension 05/17/2013  . Chronic constipation 05/17/2013  . Hemorrhoids 05/17/2013  . ONYCHOMYCOSIS 12/22/2009  . PRURITUS ANI 12/22/2009   Encouraged to continue exercise as part of the plan to control blood pressure and cholesterol Was encouraged to start this medication hydrochlorothiazide 12.5 mg tablet daily because blood pressure is still high Labs were reviewed with patient. He verbalized understanding of continue nutritional control and exercise Return to clinic in 4 weeks for blood pressure check  Javontae Marlette was given clear instructions to go to ER or return to the clinic if symptoms don't improve, worsen or new problems develop.  Servando Salina verbalized understanding.  Daisean Brodhead was told to call to get lab results if hasn't heard anything in the next week.        Jeanann Lewandowsky, MD [facility] [place], [place] [phone]   06/22/2013, 4:43 PM

## 2013-06-22 NOTE — Progress Notes (Signed)
HERE TO REVIEW LAB RESULTS-CHOLESTEROL,KIDNEY FUNCTION NOT TAKING PRESCRIBED BP MEDS-STATES HE IS EXERCISING

## 2013-08-22 ENCOUNTER — Ambulatory Visit: Payer: No Typology Code available for payment source

## 2013-09-04 ENCOUNTER — Encounter: Payer: Self-pay | Admitting: Internal Medicine

## 2013-09-04 ENCOUNTER — Telehealth: Payer: Self-pay | Admitting: Internal Medicine

## 2013-09-04 ENCOUNTER — Ambulatory Visit: Payer: No Typology Code available for payment source | Attending: Internal Medicine | Admitting: Internal Medicine

## 2013-09-04 VITALS — BP 165/93 | HR 64 | Temp 97.6°F | Resp 16 | Ht 64.0 in | Wt 177.0 lb

## 2013-09-04 DIAGNOSIS — Z91199 Patient's noncompliance with other medical treatment and regimen due to unspecified reason: Secondary | ICD-10-CM | POA: Insufficient documentation

## 2013-09-04 DIAGNOSIS — K029 Dental caries, unspecified: Secondary | ICD-10-CM

## 2013-09-04 DIAGNOSIS — Z9119 Patient's noncompliance with other medical treatment and regimen: Secondary | ICD-10-CM | POA: Insufficient documentation

## 2013-09-04 DIAGNOSIS — I10 Essential (primary) hypertension: Secondary | ICD-10-CM | POA: Insufficient documentation

## 2013-09-04 MED ORDER — AMLODIPINE BESYLATE 5 MG PO TABS: 5.0000 mg | ORAL_TABLET | Freq: Every day | ORAL | Status: DC

## 2013-09-04 NOTE — Progress Notes (Signed)
Pt is here today for a f/u visit. Pt has no C.C. today

## 2013-09-04 NOTE — Progress Notes (Signed)
Patient Demographics  Austin Rubio, is a 37 y.o. male  ZOX:[id number]  [id number]  DOB - [birth year]  Chief Complaint  Patient presents with  . Follow-up        Subjective:   Austin Rubio today is here for a follow up visit. Patient is a 37 year old Hispanic male with a past medical history of hypertension-he got out of his blood pressure pills on month ago and has not been on any other antihypertensive medications. He claims he was unaware that he needs to take them indefinitely. He has no complaints.  Patient has No headache, No chest pain, No abdominal pain - No Nausea, No new weakness tingling or numbness, No Cough - SOB.   Objective:    Filed Vitals:   09/04/13 1725  BP: 165/93  Pulse: 64  Temp: 97.6 F (36.4 C)  TempSrc: Oral  Resp: 16  Height: 5\' 4"  (1.626 m)  Weight: 177 lb (80.287 kg)  SpO2: 99%     ALLERGIES:  No Known Allergies  PAST MEDICAL HISTORY: Past Medical History  Diagnosis Date  . Unspecified essential hypertension 05/17/2013    MEDICATIONS AT HOME: Prior to Admission medications   Medication Sig Start Date End Date Taking? Authorizing Provider  amLODipine (NORVASC) 5 MG tablet Take 1 tablet (5 mg total) by mouth daily. 09/04/13   Rudie Rikard Levora Dredge, MD     Exam  General appearance :Awake, alert, not in any distress. Speech Clear. Not toxic Looking HEENT: Atraumatic and Normocephalic, pupils equally reactive to light and accomodation Neck: supple, no JVD. No cervical lymphadenopathy.  Chest:Good air entry bilaterally, no added sounds  CVS: S1 S2 regular, no murmurs.  Abdomen: Bowel sounds present, Non tender and not distended with no gaurding, rigidity or rebound. Extremities: B/L Lower Ext shows no edema, both legs are warm to touch Neurology: Awake alert, and oriented X 3, CN II-XII intact, Non focal Skin:No Rash Wounds:N/A    Data Review   CBC No results found for this basename: WBC, HGB, HCT, PLT, MCV, MCH, MCHC,  RDW, NEUTRABS, LYMPHSABS, MONOABS, EOSABS, BASOSABS, BANDABS, BANDSABD,  in the last 168 hours  Chemistries   No results found for this basename: NA, K, CL, CO2, GLUCOSE, BUN, CREATININE, GFRCGP, CALCIUM, MG, AST, ALT, ALKPHOS, BILITOT,  in the last 168 hours ------------------------------------------------------------------------------------------------------------------ No results found for this basename: HGBA1C,  in the last 72 hours ------------------------------------------------------------------------------------------------------------------ No results found for this basename: CHOL, HDL, LDLCALC, TRIG, CHOLHDL, LDLDIRECT,  in the last 72 hours ------------------------------------------------------------------------------------------------------------------ No results found for this basename: TSH, T4TOTAL, FREET3, T3FREE, THYROIDAB,  in the last 72 hours ------------------------------------------------------------------------------------------------------------------ No results found for this basename: VITAMINB12, FOLATE, FERRITIN, TIBC, IRON, RETICCTPCT,  in the last 72 hours  Coagulation profile  No results found for this basename: INR, PROTIME,  in the last 168 hours    Assessment & Plan   Hypertension - Uncontrolled- secondary to noncompliance - Will switch patient over to amlodipine 5 mg daily - Counseled him extensively about the need to be compliant with his medications, I also explained to him that previous to have been provided.  We will see him back in 2 months to reassess antihypertensive control   The patient was given clear instructions to go to ER or return to medical center if symptoms don't improve, worsen or new problems develop. The patient verbalized understanding. The patient was told to call to get lab results if they haven't heard anything in the next week.

## 2013-09-04 NOTE — Telephone Encounter (Signed)
Pt says he told Dr he needed referral to dentist.

## 2013-09-05 NOTE — Telephone Encounter (Signed)
Dental referral placed. Will call pt with update

## 2013-09-05 NOTE — Telephone Encounter (Signed)
Pt given referral information.

## 2013-09-19 ENCOUNTER — Emergency Department (HOSPITAL_COMMUNITY)
Admission: EM | Admit: 2013-09-19 | Discharge: 2013-09-19 | Disposition: A | Discharge status: Home / Self Care | Payer: No Typology Code available for payment source | Source: Home / Self Care | Attending: Family Medicine | Admitting: Family Medicine

## 2013-09-19 ENCOUNTER — Encounter (HOSPITAL_COMMUNITY): Payer: Self-pay | Admitting: Emergency Medicine

## 2013-09-19 DIAGNOSIS — R109 Unspecified abdominal pain: Secondary | ICD-10-CM

## 2013-09-19 LAB — POCT URINALYSIS DIP (DEVICE)
Protein, ur: NEGATIVE mg/dL
Specific Gravity, Urine: 1.02 (ref 1.005–1.030)
Urobilinogen, UA: 0.2 mg/dL (ref 0.0–1.0)

## 2013-09-19 LAB — COMPREHENSIVE METABOLIC PANEL
AST: 22 U/L (ref 0–37)
Albumin: 4 g/dL (ref 3.5–5.2)
Calcium: 9.1 mg/dL (ref 8.4–10.5)
Chloride: 102 mEq/L (ref 96–112)
Creatinine, Ser: 0.73 mg/dL (ref 0.50–1.35)
Total Bilirubin: 0.7 mg/dL (ref 0.3–1.2)
Total Protein: 7.4 g/dL (ref 6.0–8.3)

## 2013-09-19 LAB — CBC
MCHC: 36.1 g/dL — ABNORMAL HIGH (ref 30.0–36.0)
MCV: 84.2 fL (ref 78.0–100.0)
Platelets: 264 10*3/uL (ref 150–400)
RDW: 12.8 % (ref 11.5–15.5)
WBC: 6.3 10*3/uL (ref 4.0–10.5)

## 2013-09-19 MED ORDER — MELOXICAM 15 MG PO TABS: 15.0000 mg | ORAL_TABLET | Freq: Every day | ORAL | Status: DC | PRN

## 2013-09-19 NOTE — ED Provider Notes (Signed)
Austin Rubio is a 37 y.o. male who presents to Urgent Care today for intermittent colicky right lower quadrant abdominal pain since Monday. Patient denies any nausea vomiting or diarrhea. He denies any blood in the stool or urine. He has not tried any medications yet. The pain is moderate. He currently does not have much pain. No fevers or chills trouble breathing chest pains or palpitations.    Past Medical History  Diagnosis Date  . Unspecified essential hypertension 05/17/2013   History  Substance Use Topics  . Smoking status: Never Smoker   . Smokeless tobacco: Not on file  . Alcohol Use: No   ROS as above Medications reviewed. No current facility-administered medications for this encounter.   Current Outpatient Prescriptions  Medication Sig Dispense Refill  . amLODipine (NORVASC) 5 MG tablet Take 1 tablet (5 mg total) by mouth daily.  90 tablet  3  . meloxicam (MOBIC) 15 MG tablet Take 1 tablet (15 mg total) by mouth daily as needed for pain. spanish  30 tablet  0    Exam:  BP 150/88  Pulse 73  Temp(Src) 97.8 F (36.6 C) (Oral)  Resp 20  SpO2 100% Gen: Well NAD HEENT: EOMI,  MMM Lungs: CTABL Nl WOB Heart: RRR no MRG Abd: NABS, soft NT, ND, no rebound or guarding. Patient has pain in the right mid abdominal wall muscles with flexion. His pain is worse with abdominal muscle flexion and better with his abdomen relax. Exts: Non edematous BL  LE, warm and well perfused.   Results for orders placed during the hospital encounter of 09/19/13 (from the past 24 hour(s))  POCT URINALYSIS DIP (DEVICE)     Status: None   Collection Time    09/19/13 10:27 AM      Result Value Range   Glucose, UA NEGATIVE  NEGATIVE mg/dL   Bilirubin Urine NEGATIVE  NEGATIVE   Ketones, ur NEGATIVE  NEGATIVE mg/dL   Specific Gravity, Urine 1.020  1.005 - 1.030   Hgb urine dipstick NEGATIVE  NEGATIVE   pH 6.5  5.0 - 8.0   Protein, ur NEGATIVE  NEGATIVE mg/dL   Urobilinogen, UA 0.2  0.0 - 1.0  mg/dL   Nitrite NEGATIVE  NEGATIVE   Leukocytes, UA NEGATIVE  NEGATIVE   No results found.  Assessment and Plan: 37 y.o. male with abdominal muscle pain. Plan to obtain CBC and CMP. We'll treat with meloxicam. Followup with primary care provider. Discussed warning signs or symptoms. Please see discharge instructions. Patient expresses understanding.      Rodolph Bong, MD 09/19/13 [phone]

## 2013-09-19 NOTE — ED Notes (Signed)
RLQ abdominal pain since Monday, no n/v/d

## 2013-11-12 ENCOUNTER — Encounter: Payer: Self-pay | Admitting: Internal Medicine

## 2013-11-12 ENCOUNTER — Ambulatory Visit: Payer: No Typology Code available for payment source | Attending: Internal Medicine | Admitting: Internal Medicine

## 2013-11-12 VITALS — BP 173/82 | HR 72 | Temp 97.8°F | Resp 16 | Ht 65.0 in | Wt 179.0 lb

## 2013-11-12 DIAGNOSIS — K029 Dental caries, unspecified: Secondary | ICD-10-CM

## 2013-11-12 DIAGNOSIS — E785 Hyperlipidemia, unspecified: Secondary | ICD-10-CM

## 2013-11-12 DIAGNOSIS — I10 Essential (primary) hypertension: Secondary | ICD-10-CM

## 2013-11-12 MED ORDER — HYDROCHLOROTHIAZIDE 25 MG PO TABS: 25.0000 mg | ORAL_TABLET | Freq: Every day | ORAL | Status: DC

## 2013-11-12 NOTE — Progress Notes (Signed)
Pt is here following up on his HTN Pt is requesting lab work today.

## 2013-11-13 LAB — LIPID PANEL
CHOLESTEROL: 183 mg/dL (ref 0–200)
HDL: 42 mg/dL (ref >39)
LDL Cholesterol: 105 mg/dL — ABNORMAL HIGH (ref 0–99)
Total CHOL/HDL Ratio: 4.4 Ratio
Triglycerides: 181 mg/dL — ABNORMAL HIGH (ref <150)
VLDL: 36 mg/dL (ref 0–40)

## 2013-11-14 NOTE — Progress Notes (Signed)
Patient ID: Austin Rubio, male   DOB: 1976/01/08, 38 y.o.   MRN: [id number] Patient Demographics  Austin Rubio, is a 38 y.o. male  [id number]  [id number]  DOB - 05-20-1976  Chief Complaint  Patient presents with  . Follow-up        Subjective:   Austin Rubio is a 38 y.o. male here today for a follow up visit. Patient is here for followup for blood pressure, he is taking amlodipine 5 mg tablet by mouth daily. He has no complaint today except that he needs referral to dentist. Patient has not been taking his blood pressure medication. Patient has No headache, No chest pain, No abdominal pain - No Nausea, No new weakness tingling or numbness, No Cough - SOB.  ALLERGIES: No Known Allergies  PAST MEDICAL HISTORY: Past Medical History  Diagnosis Date  . Unspecified essential hypertension 05/17/2013    MEDICATIONS AT HOME: Prior to Admission medications   Medication Sig Start Date End Date Taking? Authorizing Provider  amLODipine (NORVASC) 5 MG tablet Take 1 tablet (5 mg total) by mouth daily. 09/04/13   Shanker Kristeen Mans, MD  hydrochlorothiazide (HYDRODIURIL) 25 MG tablet Take 1 tablet (25 mg total) by mouth daily. 11/12/13   Angelica Chessman, MD  meloxicam (MOBIC) 15 MG tablet Take 1 tablet (15 mg total) by mouth daily as needed for pain. spanish 09/19/13   Gregor Hams, MD     Objective:   Filed Vitals:   11/12/13 1741  BP: 173/82  Pulse: 72  Temp: 97.8 F (36.6 C)  TempSrc: Oral  Resp: 16  Height: 5\' 5"  (1.651 m)  Weight: 179 lb (81.194 kg)  SpO2: 99%    Exam General appearance : Awake, alert, not in any distress. Speech Clear. Not toxic looking HEENT: Atraumatic and Normocephalic, pupils equally reactive to light and accomodation Neck: supple, no JVD. No cervical lymphadenopathy.  Chest:Good air entry bilaterally, no added sounds  CVS: S1 S2 regular, no murmurs.  Abdomen: Bowel sounds present, Non tender and not distended with no gaurding,  rigidity or rebound. Extremities: B/L Lower Ext shows no edema, both legs are warm to touch Neurology: Awake alert, and oriented X 3, CN II-XII intact, Non focal Skin:No Rash Wounds:N/A   Data Review   CBC No results found for this basename: WBC, HGB, HCT, PLT, MCV, MCH, MCHC, RDW, NEUTRABS, LYMPHSABS, MONOABS, EOSABS, BASOSABS, BANDABS, BANDSABD,  in the last 168 hours  Chemistries   No results found for this basename: NA, K, CL, CO2, GLUCOSE, BUN, CREATININE, GFRCGP, CALCIUM, MG, AST, ALT, ALKPHOS, BILITOT,  in the last 168 hours ------------------------------------------------------------------------------------------------------------------ No results found for this basename: HGBA1C,  in the last 72 hours ------------------------------------------------------------------------------------------------------------------  Recent Labs  11/12/13 1805  CHOL 183  HDL 42  LDLCALC 105*  TRIG 181*  CHOLHDL 4.4   ------------------------------------------------------------------------------------------------------------------ No results found for this basename: TSH, T4TOTAL, FREET3, T3FREE, THYROIDAB,  in the last 72 hours ------------------------------------------------------------------------------------------------------------------ No results found for this basename: VITAMINB12, FOLATE, FERRITIN, TIBC, IRON, RETICCTPCT,  in the last 72 hours  Coagulation profile  No results found for this basename: INR, PROTIME,  in the last 168 hours    Assessment & Plan   1. Dental caries  - Ambulatory referral to Dentistry  2. Unspecified essential hypertension Patient was educated on hypertension, blood pressure goals were discussed Add to amlodipine - hydrochlorothiazide (HYDRODIURIL) 25 MG tablet; Take 1 tablet (25 mg total) by mouth daily.  Dispense: 90 tablet; Refill: 3  3. Dyslipidemia  - Lipid Panel Patient was extensively counseled on nutrition and exercise   Follow up  in 3 months or when necessary  Interpreter was used to communicate directly with patient for the entire encounter including providing detailed patient instructions.   The patient was given clear instructions to go to ER or return to medical center if symptoms don't improve, worsen or new problems develop. The patient verbalized understanding. The patient was told to call to get lab results if they haven't heard anything in the next week.    Angelica Chessman, MD, [place], [place], [place] and [place], [place]   11/14/2013, 4:25 PM

## 2013-11-20 ENCOUNTER — Telehealth: Payer: Self-pay | Admitting: *Deleted

## 2013-11-20 NOTE — Telephone Encounter (Signed)
Message copied by Joan Mayans on Tue Nov 20, 2013  5:29 PM ------      Message from: Angelica Chessman E      Created: Fri Nov 16, 2013  4:34 PM       Please inform patient that his cholesterol level is slightly better than before, will encourage him to continue dietary control and physical exercise ------

## 2013-11-20 NOTE — Telephone Encounter (Signed)
I spoke to the pt and told him about his lab results. I encouraged a great diet and exercise.

## 2013-12-04 ENCOUNTER — Ambulatory Visit: Payer: Self-pay

## 2014-02-06 ENCOUNTER — Emergency Department (HOSPITAL_COMMUNITY)
Admission: EM | Admit: 2014-02-06 | Discharge: 2014-02-06 | Disposition: A | Discharge status: Home / Self Care | Payer: No Typology Code available for payment source | Source: Home / Self Care | Attending: Family Medicine | Admitting: Family Medicine

## 2014-02-06 ENCOUNTER — Encounter (HOSPITAL_COMMUNITY): Payer: Self-pay | Admitting: Emergency Medicine

## 2014-02-06 DIAGNOSIS — B9789 Other viral agents as the cause of diseases classified elsewhere: Secondary | ICD-10-CM

## 2014-02-06 DIAGNOSIS — B349 Viral infection, unspecified: Secondary | ICD-10-CM

## 2014-02-06 LAB — POCT URINALYSIS DIP (DEVICE)
BILIRUBIN URINE: NEGATIVE
GLUCOSE, UA: NEGATIVE mg/dL
HGB URINE DIPSTICK: NEGATIVE
Leukocytes, UA: NEGATIVE
Nitrite: NEGATIVE
Protein, ur: NEGATIVE mg/dL
SPECIFIC GRAVITY, URINE: 1.01 (ref 1.005–1.030)
Urobilinogen, UA: 0.2 mg/dL (ref 0.0–1.0)
pH: 6 (ref 5.0–8.0)

## 2014-02-06 MED ORDER — AMOXICILLIN 500 MG PO CAPS: 500.0000 mg | ORAL_CAPSULE | Freq: Three times a day (TID) | ORAL | Status: DC

## 2014-02-06 NOTE — ED Provider Notes (Signed)
CSN: [id number]     Arrival date & time 02/06/14  1838 History   First MD Initiated Contact with Patient 02/06/14 2004     Chief Complaint  Patient presents with  . URI   (Consider location/radiation/quality/duration/timing/severity/associated sxs/prior Treatment) HPI  History obtained with Shonna Chock as interpreter.  Patient is here for headache and fevers.  He states that this started about 3 days ago.  He reports an intermittent frontal headache that can be severe.  Also with fever and chills.  Reports fatigue and myalgias.  Had some diarrhea on day 1 and some dysuria. No abdominal pain, neck pain/stiffness, cough, rhinorrhea, further diarrhea, nausea, vomiting, rash.  Wife denies any confusion or strange behavior.  He states that a coworker was sick with similar symptoms last week.  Past Medical History  Diagnosis Date  . Unspecified essential hypertension 05/17/2013   Past Surgical History  Procedure Laterality Date  . Hernia repair     Family History  Problem Relation Age of Onset  . Diabetes Mother   . Heart disease Father    History  Substance Use Topics  . Smoking status: Never Smoker   . Smokeless tobacco: Not on file  . Alcohol Use: No    Review of Systems  Constitutional: Positive for fever, chills and diaphoresis. Negative for activity change and appetite change.  Eyes: Negative.   Respiratory: Negative.   Cardiovascular: Negative.   Gastrointestinal: Positive for diarrhea. Negative for nausea, vomiting and abdominal pain.  Genitourinary: Positive for dysuria.  Musculoskeletal: Positive for myalgias. Negative for neck pain and neck stiffness.  Skin: Negative for rash.  Neurological: Positive for headaches. Negative for dizziness and weakness.  Psychiatric/Behavioral: Negative for confusion and agitation.    Allergies  Review of patient's allergies indicates no known allergies.  Home Medications   Current Outpatient Rx  Name  Route  Sig  Dispense  Refill  .  amLODipine (NORVASC) 5 MG tablet   Oral   Take 1 tablet (5 mg total) by mouth daily.   90 tablet   3   . amoxicillin (AMOXIL) 500 MG capsule   Oral   Take 1 capsule (500 mg total) by mouth 3 (three) times daily.   21 capsule   0   . hydrochlorothiazide (HYDRODIURIL) 25 MG tablet   Oral   Take 1 tablet (25 mg total) by mouth daily.   90 tablet   3   . meloxicam (MOBIC) 15 MG tablet   Oral   Take 1 tablet (15 mg total) by mouth daily as needed for pain. spanish   30 tablet   0    BP 142/86  Pulse 109  Temp(Src) 102.3 F (39.1 C) (Oral)  Resp 19  SpO2 97% Physical Exam  Constitutional: He is oriented to person, place, and time. He appears well-developed and well-nourished. No distress.  Well appearing  HENT:  Head: Normocephalic and atraumatic.  Right Ear: External ear normal.  Left Ear: External ear normal.  Mouth/Throat: Oropharynx is clear and moist. No oropharyngeal exudate.  Eyes: Conjunctivae are normal. Right eye exhibits no discharge. Left eye exhibits no discharge.  Neck: Normal range of motion. Neck supple.  No rigidity or pain with movement.  Cardiovascular: Regular rhythm and normal heart sounds.   No murmur heard. Mild tachycardia  Pulmonary/Chest: Effort normal and breath sounds normal. No respiratory distress. He has no wheezes. He has no rales.  Abdominal: Soft. Bowel sounds are normal. He exhibits no distension. There is no tenderness. There  is no rebound and no guarding.  Musculoskeletal: He exhibits no edema.  Lymphadenopathy:    He has no cervical adenopathy.  Neurological: He is alert and oriented to person, place, and time. He exhibits normal muscle tone.  Skin: Skin is warm and dry. No rash noted. He is not diaphoretic.  Psychiatric: His behavior is normal. Judgment and thought content normal.    ED Course  Procedures (including critical care time) Labs Review Labs Reviewed  POCT URINALYSIS DIP (DEVICE) - Abnormal; Notable for the  following:    Ketones, ur TRACE (*)    All other components within normal limits   Imaging Review No results found.   MDM   1. Viral illness    Overall very well appearing. Meningitis is unlikely given lack of neck stiffness, well appearing and duration of illness. UA unlikely given negative UA. Discussed importance of fluids. Tylenol or motrin for fever, body aches, and headache. As he has started some amoxicillin, will continue for 7 days. Strict return precautions given as in AVS.  F/u with PCP on Friday.    Melony Overly, MD 02/06/14 2116

## 2014-02-06 NOTE — ED Provider Notes (Signed)
Medical screening examination/treatment/procedure(s) were performed by resident physician or non-physician practitioner and as supervising physician I was immediately available for consultation/collaboration.   Pauline Good MD.   Billy Fischer, MD 02/06/14 2120

## 2014-02-06 NOTE — Discharge Instructions (Signed)
Creo que tienes una enfermedad similar a la gripe. Por favor, tomar muchos lquidos. Tomar Tylenol y el ibuprofeno, segn sea necesario para la fiebre, dolor de [place] y dolores en el cuerpo.  Si usted desarrolla una erupcin, su cuello se pone muy dura y dolorosa, o su esposa se da cuenta de que usted est actuando extrao, tienes que ir a la sala de Mult[employer] programmer.  Por favor, el seguimiento con su mdico de cabecera el viernes. No ir al [facility] que haya visto a su mdico habitual.  I think you have a flu like illness. Please drink plenty of fluids. Take tylenol and ibuprofen as needed for fever, headache and body aches.  If you develop a rash, your neck gets really stiff and painful, or your wife notices you are acting strange, you need to go to the emergency room.  Please follow up with your regular doctor on Friday. Do not go to work until you have seen your regular doctor.

## 2014-02-06 NOTE — ED Notes (Signed)
Pt c/o cold sxs onset yest  Sxs include diarrhea that has subsided, fevers, HA,BA, dysuria,  Reports he started to take penicillin 500mg ?? From [place] x 2 days Denies v/n Alert w/no sigsn of acute distress.

## 2014-02-11 ENCOUNTER — Encounter: Payer: Self-pay | Admitting: Internal Medicine

## 2014-02-11 ENCOUNTER — Ambulatory Visit: Payer: No Typology Code available for payment source | Attending: Internal Medicine | Admitting: Internal Medicine

## 2014-02-11 VITALS — BP 151/91 | HR 73 | Temp 98.3°F | Resp 16 | Ht 65.0 in | Wt 178.0 lb

## 2014-02-11 DIAGNOSIS — I10 Essential (primary) hypertension: Secondary | ICD-10-CM

## 2014-02-11 DIAGNOSIS — E785 Hyperlipidemia, unspecified: Secondary | ICD-10-CM

## 2014-02-11 MED ORDER — HYDROCHLOROTHIAZIDE 25 MG PO TABS: 25.0000 mg | ORAL_TABLET | Freq: Every day | ORAL | Status: DC

## 2014-02-11 MED ORDER — AMLODIPINE BESYLATE 10 MG PO TABS: 10.0000 mg | ORAL_TABLET | Freq: Every day | ORAL | Status: DC

## 2014-02-11 NOTE — Progress Notes (Signed)
Patient ID: Austin Rubio, male   DOB: 1976/08/02, 38 y.o.   MRN: [id number]   Austin Rubio, is a 38 y.o. male  [id number]  [id number]  DOB - [birth year]  Chief Complaint  Patient presents with  . Follow-up        Subjective:   Austin Rubio is a 38 y.o. male here today for a follow up visit. Patient is known to have hypertension on Norvasc 5 mg tablet by mouth daily and hydrochlorothiazide 25 mg tablet by mouth daily. Blood pressure is not at goal. Patient has been refills on his medications. He has no complaint today. He would like to control cholesterol without medication but with diet and exercise. He does not smoke cigarette, he does not drink alcohol. Patient has No headache, No chest pain, No abdominal pain - No Nausea, No new weakness tingling or numbness, No Cough - SOB.  No problems updated.  ALLERGIES: No Known Allergies  PAST MEDICAL HISTORY: Past Medical History  Diagnosis Date  . Unspecified essential hypertension 05/17/2013    MEDICATIONS AT HOME: Prior to Admission medications   Medication Sig Start Date End Date Taking? Authorizing Provider  amLODipine (NORVASC) 10 MG tablet Take 1 tablet (10 mg total) by mouth daily. 02/11/14  Yes Angelica Chessman, MD  amoxicillin (AMOXIL) 500 MG capsule Take 1 capsule (500 mg total) by mouth 3 (three) times daily. 02/06/14  Yes Melony Overly, MD  hydrochlorothiazide (HYDRODIURIL) 25 MG tablet Take 1 tablet (25 mg total) by mouth daily. 02/11/14  Yes Angelica Chessman, MD  meloxicam (MOBIC) 15 MG tablet Take 1 tablet (15 mg total) by mouth daily as needed for pain. spanish 09/19/13   Gregor Hams, MD     Objective:   Filed Vitals:   02/11/14 1645  BP: 151/91  Pulse: 73  Temp: 98.3 F (36.8 C)  TempSrc: Oral  Resp: 16  Height: 5\' 5"  (1.651 m)  Weight: 178 lb (80.74 kg)  SpO2: 98%    Exam General appearance : Awake, alert, not in any distress. Speech Clear. Not toxic looking HEENT: Atraumatic and  Normocephalic, pupils equally reactive to light and accomodation Neck: supple, no JVD. No cervical lymphadenopathy.  Chest:Good air entry bilaterally, no added sounds  CVS: S1 S2 regular, no murmurs.  Abdomen: Bowel sounds present, Non tender and not distended with no gaurding, rigidity or rebound. Extremities: B/L Lower Ext shows no edema, both legs are warm to touch Neurology: Awake alert, and oriented X 3, CN II-XII intact, Non focal Skin:No Rash Wounds:N/A  Data Review Lab Results  Component Value Date   HGBA1C 5.5% 05/17/2013     Assessment & Plan   1. Unspecified essential hypertension We will increase Norvasc to 10 mg tablet by mouth daily - hydrochlorothiazide (HYDRODIURIL) 25 MG tablet; Take 1 tablet (25 mg total) by mouth daily.  Dispense: 90 tablet; Refill: 3 - amLODipine (NORVASC) 10 MG tablet; Take 1 tablet (10 mg total) by mouth daily.  Dispense: 90 tablet; Refill: 3  2. Dyslipidemia Patient was extensively counseled on nutrition and exercise  We discussed blood pressure goal, patient is to adhere strictly with DASH diet as instructed  Return in about 3 months (around 05/13/2014), or if symptoms worsen or fail to improve, for Follow up HTN.  The patient was given clear instructions to go to ER or return to medical center if symptoms don't improve, worsen or new problems develop. The patient verbalized understanding. The patient was told to call to  get lab results if they haven't heard anything in the next week.   This note has been created with Surveyor, quantity. Any transcriptional errors are unintentional.    Angelica Chessman, MD, [place], [place], [place] and [facility] [place], [place]   02/11/2014, 4:56 PM

## 2014-02-11 NOTE — Progress Notes (Signed)
Pt is here following up on his HTN. Pt has an interpretor.

## 2014-02-11 NOTE — Patient Instructions (Signed)
Hipertensión  (Hypertension)  Cuando el corazón late bombea la sangre a través de las arterias. La fuerza que se origina es la presión arterial. Si la presión es demasiado elevada, se denomina hipertensión. El peligro radica en que puede sufrirla y no saberlo. Hipertensión puede significar que su corazón debe trabajar más intensamente para bombear sangre. Las arterias pueden estar estrechas o rígidas. El trabajo extra aumenta el riesgo de enfermedades cardíacas, ictus y otros problemas.   La presión arterial está formada por dos números: el número mayor sobre el número menor, por ejemplo110/70. Se señala "110/72". Los valores ideales son por debajo de 120 para el número más alto (sistólica) y por debajo de 80 para el más bajo (diastólica). Anote su presión sanguínea hoy.  Debe prestar mucha atención a su presión arterial si sufre alguna otra enfermedad como:  · Insuficiencia cardíaca  · Ataques cardiacos previos  · Diabetes  · Enfermedad renal crónica  · Ictus previo  · Múltiples factores de riesgo para enfermedades cardíacas  Para diagnosticar si usted sufre hipertensión arterial, debe medirse la presión mientras encuentra sentado con el brazo elevado a la altura del nivel del corazón. Debe medirse al menos 2 veces. Una única lectura de presión arterial elevada (especialmente en el servicio de emergencias) no significa que necesita tratamiento. Hay enfermedades en las que la presión arterial es diferente en ambos brazos. Es importante que consulte rápidamente con su médico para un control.  La mayoría de las personas sufren hipertensión esencial, lo que significa que no tiene una causa específica. Este tipo de hipertensión puede bajarse modificando algunos factores en el estilo de vida como:  · Estrés.  · El consumo de cigarrillos.  · La falta de actividad física.  · Peso excesivo  · Consumo de drogas y alcohol.  · Consumiendo menos sal  La mayoría de las personas no tienen síntomas hasta que la hipertensión  ocasiona un daño en el organismo. El tratamiento efectivo puede evitar, demorar o reducir ese daño.  TRATAMIENTO:  El tratamiento para la hipertensión, cuando se ha identificado una causa, está dirigido a la misma Hay un gran número en medicamentos para tratarla. Se agrupan en diferentes categorías y el médico seleccionará los medicamentos indicados para usted. Muchos medicamentos disponibles tienen efectos secundarios. Debe comentar los efectos secundarios con su médico.  Si la presión arterial permanece elevada después de modificar su estilo de vida o comenzar a tomar medicamentos:  · Los medicamentos deben ser reemplazados  · Puede ser necesario evaluar otros problemas.  · Debe estar seguro que comprende las indicaciones, que sabe cómo y cuándo tomar los medicamentos.  · Asegúrese de realizar un control con su médico dentro del tiempo indicado (generalmente dentro de las dos semanas) para volver a evaluar la presión arterial y revisar los medicamentos prescritos.  · Si está tomando más de un medicamento para la presión arterial, asegúrese que sabe cómo y en qué momentos debe tomarlos. Tomar los medicamentos al mismo tiempo puede dar como resultado un gran descenso en la presión arterial.  SOLICITE ATENCIÓN MÉDICA DE INMEDIATO SI PRESENTA:  · Dolor de cabeza intenso, visión borrosa o cambios en la visión, o confusión.  · Debilidad o adormecimientos inusuales o sensación de desmayo.  · Dolor de pecho o abdominal intenso, vómitos o problemas para respirar.  ASEGÚRESE QUE:   · Comprende estas instrucciones.  · Controlará su enfermedad.  · Solicitará ayuda inmediatamente si no mejora o si empeora.  Document Released: 10/18/2005 Document Revised: 01/10/2012  [employer]® 

## 2014-05-13 ENCOUNTER — Ambulatory Visit: Payer: No Typology Code available for payment source | Admitting: Internal Medicine

## 2014-08-09 ENCOUNTER — Ambulatory Visit: Payer: Self-pay | Attending: Internal Medicine

## 2015-10-21 ENCOUNTER — Ambulatory Visit: Payer: No Typology Code available for payment source | Attending: Internal Medicine

## 2016-04-24 ENCOUNTER — Ambulatory Visit (HOSPITAL_COMMUNITY)
Admission: EM | Admit: 2016-04-24 | Discharge: 2016-04-24 | Disposition: A | Discharge status: Home / Self Care | Payer: No Typology Code available for payment source | Attending: Emergency Medicine | Admitting: Emergency Medicine

## 2016-04-24 ENCOUNTER — Encounter (HOSPITAL_COMMUNITY): Payer: Self-pay | Admitting: Emergency Medicine

## 2016-04-24 DIAGNOSIS — L02416 Cutaneous abscess of left lower limb: Secondary | ICD-10-CM

## 2016-04-24 MED ORDER — LIDOCAINE HCL (PF) 2 % IJ SOLN
INTRAMUSCULAR | Status: AC
  Filled 2016-04-24: qty 2

## 2016-04-24 MED ORDER — CEPHALEXIN 250 MG PO CAPS: 250.0000 mg | ORAL_CAPSULE | Freq: Four times a day (QID) | ORAL | Status: DC

## 2016-04-24 MED ORDER — SULFAMETHOXAZOLE-TRIMETHOPRIM 800-160 MG PO TABS: 1.0000 | ORAL_TABLET | Freq: Two times a day (BID) | ORAL | Status: AC

## 2016-04-24 NOTE — ED Provider Notes (Signed)
CSN: [id number]     Arrival date & time 04/24/16  1346 History   First MD Initiated Contact with Patient 04/24/16 1510     Chief Complaint  Patient presents with  . Abscess   (Consider location/radiation/quality/duration/timing/severity/associated sxs/prior Treatment) HPI He is a 40 year old man here for evaluation of left leg abscess. His daughter is present and access interpreter.  He states this started 5 or 6 days ago with a small pimple just above the left knee. It has gradually gotten larger and more painful. He states he was able to get pus out of it yesterday. Pain is worse with flexion and extension of the knee. There is some surrounding redness that has been spreading. No fevers.  Past Medical History  Diagnosis Date  . Unspecified essential hypertension 05/17/2013   Past Surgical History  Procedure Laterality Date  . Hernia repair     Family History  Problem Relation Age of Onset  . Diabetes Mother   . Heart disease Father    Social History  Substance Use Topics  . Smoking status: Never Smoker   . Smokeless tobacco: None  . Alcohol Use: No    Review of Systems As in history of present illness Allergies  Review of patient's allergies indicates no known allergies.  Home Medications   Prior to Admission medications   Medication Sig Start Date End Date Taking? Authorizing Provider  amLODipine (NORVASC) 10 MG tablet Take 1 tablet (10 mg total) by mouth daily. 02/11/14   Tresa Garter, MD  cephALEXin (KEFLEX) 250 MG capsule Take 1 capsule (250 mg total) by mouth 4 (four) times daily. 04/24/16   Melony Overly, MD  hydrochlorothiazide (HYDRODIURIL) 25 MG tablet Take 1 tablet (25 mg total) by mouth daily. 02/11/14   Tresa Garter, MD  meloxicam (MOBIC) 15 MG tablet Take 1 tablet (15 mg total) by mouth daily as needed for pain. spanish 09/19/13   Gregor Hams, MD  sulfamethoxazole-trimethoprim (BACTRIM DS,SEPTRA DS) 800-160 MG tablet Take 1 tablet by mouth 2 (two)  times daily. 04/24/16 05/01/16  Melony Overly, MD   Meds Ordered and Administered this Visit  Medications - No data to display  BP 163/86 mmHg  Pulse 68  Temp(Src) 98.2 F (36.8 C) (Oral)  Resp 16  SpO2 100% No data found.   Physical Exam  Constitutional: He is oriented to person, place, and time. He appears well-developed and well-nourished. No distress.  Cardiovascular: Normal rate.   Pulmonary/Chest: Effort normal.  Neurological: He is alert and oriented to person, place, and time.  Skin:  He has a 2 x 3 cm indurated area just above the left knee with 1-2 cm of surrounding erythema.  There is a central punctum.    ED Course  .Marland KitchenIncision and Drainage Date/Time: 04/24/2016 4:24 PM Performed by: Melony Overly Authorized by: Melony Overly Consent: Verbal consent obtained. Risks and benefits: risks, benefits and alternatives were discussed Consent given by: patient Patient understanding: patient states understanding of the procedure being performed Patient identity confirmed: verbally with patient Time out: Immediately prior to procedure a "time out" was called to verify the correct patient, procedure, equipment, support staff and site/side marked as required. Type: abscess Body area: lower extremity Location details: left leg Anesthesia: local infiltration Local anesthetic: lidocaine 2% without epinephrine Anesthetic total: 2 ml Scalpel size: 11 Incision type: single straight Incision depth: dermal Complexity: simple Drainage: bloody Drainage amount: scant Wound treatment: wound left open Patient tolerance: Patient tolerated the  procedure well with no immediate complications   (including critical care time)  Labs Review Labs Reviewed - No data to display  Imaging Review No results found.   MDM   1. Abscess of leg, left    Unable to express additional pus today for culture. Will treat with Keflex and Bactrim for 1 week. Wound care discussed. Follow-up as  needed.    Melony Overly, MD 04/24/16 [phone]

## 2016-04-24 NOTE — ED Notes (Signed)
Applied bacitracin, 4x4 gauze and 4 in coban per VO from Dr. Bridgett Larsson.

## 2016-04-24 NOTE — Discharge Instructions (Signed)
Absceso (Abscess)  Un absceso (granoo fornculo) es una zona infectada sobre la piel o debajo de la misma. Esta zona se llena de un lquido blanco amarillento (pus) y Psychiatric nurse (residuos).  CUIDADOS EN EL HOGAR  Tome slo la medicacin que le indic el mdico.  Si le han recetado antibiticos, tmelos segn las indicaciones. Finalice el [place], aunque comience a Sports administrator.  Si le aplicaron una gasa, siga las indicaciones del mdico para [place].  Para evitar la propagacin de la infeccin:  Mantenga el absceso cubierto con el vendaje.  Lvese bien las manos.  No comparta artculos de cuidado personal, toallas o jacuzzis con otros.  Evite el contacto con la piel de Producer, television/film/video.  Mantenga la piel y la ropa limpia alrededor del absceso.  Cumpla con los controles mdicos segn las indicaciones. SOLICITE AYUDA DE INMEDIATO SI:  Aumenta el dolor, el enrojecimiento o la Estate agent de la herida.  Observa lquido o sangre que proviene del sitio de la herida.  Tiene fiebre, escalofros o se siente enfermo.  Tiene fiebre. ASEGRESE DE QUE:   Comprende esas instrucciones para el alta mdica.  Controlar su enfermedad.  Solicitar ayuda de inmediato si no mejora o empeora.   Esta informacin no tiene Marine scientist el consejo del mdico. Asegrese de hacerle al mdico cualquier pregunta que tenga.   Document Released: 01/14/2009 Document Revised: 04/18/2012 Elsevier Interactive Patient Education [employer].

## 2016-04-24 NOTE — ED Notes (Signed)
Pt reports abscess to LLE onset 1 week associated w/swelling, redness and tenderness Reports he squeezed it yest and it began to drain pus He is A&O x4... No acute distress.

## 2017-01-24 ENCOUNTER — Ambulatory Visit: Payer: Self-pay | Attending: Internal Medicine

## 2017-03-18 ENCOUNTER — Encounter: Payer: Self-pay | Admitting: Internal Medicine

## 2017-03-18 ENCOUNTER — Ambulatory Visit: Payer: Self-pay | Attending: Internal Medicine | Admitting: Internal Medicine

## 2017-03-18 VITALS — BP 159/76 | HR 67 | Temp 98.1°F | Resp 16 | Ht 65.5 in | Wt 196.4 lb

## 2017-03-18 DIAGNOSIS — I10 Essential (primary) hypertension: Secondary | ICD-10-CM | POA: Insufficient documentation

## 2017-03-18 DIAGNOSIS — E785 Hyperlipidemia, unspecified: Secondary | ICD-10-CM | POA: Insufficient documentation

## 2017-03-18 DIAGNOSIS — R319 Hematuria, unspecified: Secondary | ICD-10-CM | POA: Insufficient documentation

## 2017-03-18 LAB — POCT URINALYSIS DIP (MANUAL ENTRY)
Bilirubin, UA: NEGATIVE
GLUCOSE UA: NEGATIVE mg/dL
Ketones, POC UA: NEGATIVE mg/dL
Leukocytes, UA: NEGATIVE
NITRITE UA: NEGATIVE
PROTEIN UA: NEGATIVE mg/dL
RBC UA: NEGATIVE
Spec Grav, UA: 1.015 (ref 1.010–1.025)
UROBILINOGEN UA: 0.2 U/dL
pH, UA: 7 (ref 5.0–8.0)

## 2017-03-18 MED ORDER — AMLODIPINE BESYLATE 10 MG PO TABS: 10.0000 mg | ORAL_TABLET | Freq: Every day | ORAL | 3 refills | Status: DC

## 2017-03-18 NOTE — Progress Notes (Signed)
Patient ID: Austin Rubio, male    DOB: Apr 19, 1976  MRN: [id number]  CC: Establish Care (Reestablish); Hypertension; and Hematuria (x 1 wk)   Subjective: Hernandez Losasso is a 41 y.o. male who presents for UC visit and to become re-establish His concerns today include:  1.  Hematuria: reports 2 episodes of noticing blood at tip of penis.  1st episode occurred 1 wk ago.  He notice a drop of blood after urinating immediately post intercourse. -2nd episode occur the following day.  Notice a few drops of blood after urinating. -no dysuria or penile dischg.  Sexually active with only his spouse.  -no blood in toilet on either occasion.  -has problem maintaining erection during intercourse x several mths.  2.  HTN:  -he stopped taking meds because they were causing abdominal pain.  Also his orange card had expired  Patient Active Problem List   Diagnosis Date Noted  . Dyslipidemia 06/22/2013  . Unspecified essential hypertension 05/17/2013  . Chronic constipation 05/17/2013  . Hemorrhoids 05/17/2013  . ONYCHOMYCOSIS 12/22/2009  . PRURITUS ANI 12/22/2009     No current outpatient prescriptions on file prior to visit.   No current facility-administered medications on file prior to visit.     No Known Allergies  Social History   Social History  . Marital status: Married    Spouse name: N/A  . Number of children: N/A  . Years of education: N/A   Occupational History  . Not on file.   Social History Main Topics  . Smoking status: Never Smoker  . Smokeless tobacco: Never Used  . Alcohol use No     Comment: socially  . Drug use: No  . Sexual activity: Yes    Partners: Female    Birth control/ protection: None   Other Topics Concern  . Not on file   Social History Narrative  . No narrative on file    Family History  Problem Relation Age of Onset  . Diabetes Mother   . Heart disease Father     Past Surgical History:  Procedure Laterality Date  . HERNIA  REPAIR      ROS: Review of Systems  Cardiovascular: Negative for chest pain.  Gastrointestinal: Negative for abdominal pain.  Genitourinary: Positive for hematuria. Negative for discharge and dysuria.  Neurological: Negative for light-headedness and headaches.    PHYSICAL EXAM: BP (!) 159/76 (BP Location: Left Arm, Patient Position: Sitting, Cuff Size: Large)   Pulse 67   Temp 98.1 F (36.7 C) (Oral)   Resp 16   Ht 5' 5.5" (1.664 m)   Wt 196 lb 6.4 oz (89.1 kg)   SpO2 98%   BMI 32.19 kg/m   Physical Exam  Constitutional: He appears well-developed and well-nourished.  Neck: No thyromegaly present.  Cardiovascular: Normal rate and regular rhythm.   No murmur heard. Pulmonary/Chest: Effort normal and breath sounds normal.  Abdominal: Soft. Bowel sounds are normal. There is no tenderness.  Musculoskeletal: He exhibits no edema.  Lymphadenopathy:    He has no cervical adenopathy.    Results for orders placed or performed in visit on 03/18/17  POCT urinalysis dipstick  Result Value Ref Range   Color, UA light yellow (A) yellow   Clarity, UA clear clear   Glucose, UA negative negative mg/dL   Bilirubin, UA negative negative   Ketones, POC UA negative negative mg/dL   Spec Grav, UA 1.015 1.010 - 1.025   Blood, UA negative negative  pH, UA 7.0 5.0 - 8.0   Protein Ur, POC negative negative mg/dL   Urobilinogen, UA 0.2 0.2 or 1.0 E.U./dL   Nitrite, UA Negative Negative   Leukocytes, UA Negative Negative    ASSESSMENT AND PLAN: 1. Hematuria, unspecified type - POCT urinalysis dipstick - No blood in the urine today and no UTI. - HIV antibody - Ambulatory referral to Urology  2. Essential hypertension -DASH diet discussed. Encouraged compliance with medications. We will restart just the Norvasc at this time. - CBC - Comprehensive metabolic panel - Lipid panel - amLODipine (NORVASC) 10 MG tablet; Take 1 tablet (10 mg total) by mouth daily.  Dispense: 90 tablet; Refill:  3  Patient was given the opportunity to ask questions.  Patient verbalized understanding of the plan and was able to repeat key elements of the plan.  [employer] interpreter used during this encounter.  Orders Placed This Encounter  Procedures  . CBC  . Comprehensive metabolic panel  . Lipid panel  . HIV antibody  . Ambulatory referral to Urology  . POCT urinalysis dipstick     Requested Prescriptions   Signed Prescriptions Disp Refills  . amLODipine (NORVASC) 10 MG tablet 90 tablet 3    Sig: Take 1 tablet (10 mg total) by mouth daily.    Return in about 3 months (around 06/18/2017).  Karle Plumber, MD, FACP

## 2017-03-18 NOTE — Patient Instructions (Signed)
Hematuria - Adultos  (Hematuria, Adult)  La hematuria es la presencia de sangre en la orina. La causa puede ser una infección en la vejiga, en los riñones, en la próstata, cálculos renales o cáncer de las vías urinarias. Normalmente las infecciones pueden tratarse con medicamentos, y los cálculos renales, por lo general, se eliminarán a través de la orina. Si ninguna de estas es la causa de su hematuria, quizás se necesiten más estudios para descubrir el motivo.  Es muy importante que le informe a su médico si ve sangre en la orina, aunque el sangrado se detenga sin tratamiento o no cause dolor. La sangre que aparece en la orina y luego se detiene y vuelve a aparecer nuevamente puede ser un síntoma de una enfermedad muy grave. Además, el dolor no es un síntoma en las etapas iniciales de muchos tipos de cáncer urinarios.  INSTRUCCIONES PARA EL CUIDADO EN EL HOGAR  · Beba mucho líquido, entre 3 a 4 litros por día. Si le han diagnosticado una infección, se recomienda especialmente el jugo de arándanos rojos, además de grandes cantidades de agua.  · Evite la cafeína, el té y las bebidas con gas porque suelen irritar la vejiga.  · Evite el alcohol ya que puede irritar la próstata.  · Tome todos los medicamentos como se lo haya indicado el médico.  · Si le recetaron antibióticos, asegúrese de terminarlos, incluso si comienza a sentirse mejor.  · Si le han diagnosticado cálculos renales, siga las instrucciones de su médico con respecto a filtrar la orina para rescatar el cálculo.  · Vacíe la vejiga con frecuencia. Evite retener la orina durante largos períodos.  · Después de defecar, las mujeres deben higienizarse desde adelante hacia atrás. Use solo un papel por vez.  · Si es una mujer, vacíe la vejiga antes y después de tener relaciones sexuales.    SOLICITE ATENCIÓN MÉDICA SI:  · Siente dolor en la espalda.  · Tiene fiebre.  · Tiene sensación de malestar estomacal (náuseas) o vómitos.   · Los síntomas no mejoran después de 3 días. Si la condición empeora, visite al médico antes de lo previsto.    SOLICITE ATENCIÓN MÉDICA DE INMEDIATO SI:  · Vomita con frecuencia e intensamente y no puede tolerar los medicamentos.  · Siente un dolor intenso en la espalda o el abdomen incluso tomando los medicamentos.  · Elimina coágulos grandes o sangre con la orina.  · Se siente extremadamente débil, se desmaya o pierde el conocimiento.    ASEGÚRESE DE QUE:  · Comprende estas instrucciones.  · Controlará su afección.  · Recibirá ayuda de inmediato si no mejora o si empeora.    Esta información no tiene como fin reemplazar el consejo del médico. Asegúrese de hacerle al médico cualquier pregunta que tenga.  Document Released: 10/18/2005 Document Revised: 11/08/2014 Document Reviewed: 06/18/2013  Elsevier Interactive Patient Education © 2017 Elsevier Inc.

## 2017-03-22 ENCOUNTER — Ambulatory Visit: Payer: Self-pay | Attending: Internal Medicine

## 2017-03-22 DIAGNOSIS — I10 Essential (primary) hypertension: Secondary | ICD-10-CM | POA: Insufficient documentation

## 2017-03-22 NOTE — Progress Notes (Signed)
Patient here for lab visit only 

## 2017-03-23 LAB — COMPREHENSIVE METABOLIC PANEL
ALBUMIN: 4.1 g/dL (ref 3.5–5.5)
ALK PHOS: 80 IU/L (ref 39–117)
ALT: 28 IU/L (ref 0–44)
AST: 23 IU/L (ref 0–40)
Albumin/Globulin Ratio: 1.6 (ref 1.2–2.2)
BILIRUBIN TOTAL: 0.6 mg/dL (ref 0.0–1.2)
BUN / CREAT RATIO: 17 (ref 9–20)
BUN: 16 mg/dL (ref 6–24)
CHLORIDE: 104 mmol/L (ref 96–106)
CO2: 26 mmol/L (ref 18–29)
CREATININE: 0.92 mg/dL (ref 0.76–1.27)
Calcium: 8.8 mg/dL (ref 8.7–10.2)
GFR calc Af Amer: 118 mL/min/{1.73_m2} (ref >59)
GFR calc non Af Amer: 102 mL/min/{1.73_m2} (ref >59)
GLUCOSE: 84 mg/dL (ref 65–99)
Globulin, Total: 2.5 g/dL (ref 1.5–4.5)
Potassium: 4.2 mmol/L (ref 3.5–5.2)
Sodium: 141 mmol/L (ref 134–144)
Total Protein: 6.6 g/dL (ref 6.0–8.5)

## 2017-03-23 LAB — CBC
HEMOGLOBIN: 16.5 g/dL (ref 13.0–17.7)
Hematocrit: 48.9 % (ref 37.5–51.0)
MCH: 29.9 pg (ref 26.6–33.0)
MCHC: 33.7 g/dL (ref 31.5–35.7)
MCV: 89 fL (ref 79–97)
PLATELETS: 253 10*3/uL (ref 150–379)
RBC: 5.51 x10E6/uL (ref 4.14–5.80)
RDW: 13.8 % (ref 12.3–15.4)
WBC: 7 10*3/uL (ref 3.4–10.8)

## 2017-03-23 LAB — LIPID PANEL
CHOLESTEROL TOTAL: 189 mg/dL (ref 100–199)
Chol/HDL Ratio: 4.6 ratio (ref 0.0–5.0)
HDL: 41 mg/dL (ref >39)
LDL CALC: 128 mg/dL — AB (ref 0–99)
TRIGLYCERIDES: 99 mg/dL (ref 0–149)
VLDL CHOLESTEROL CAL: 20 mg/dL (ref 5–40)

## 2017-03-23 LAB — HIV ANTIBODY (ROUTINE TESTING W REFLEX): HIV Screen 4th Generation wRfx: NONREACTIVE

## 2017-06-20 ENCOUNTER — Ambulatory Visit: Payer: Self-pay | Admitting: Internal Medicine

## 2017-06-28 ENCOUNTER — Ambulatory Visit: Payer: Self-pay | Admitting: Urology

## 2017-07-08 ENCOUNTER — Encounter: Payer: Self-pay | Admitting: Internal Medicine

## 2017-07-08 ENCOUNTER — Ambulatory Visit: Payer: Self-pay | Attending: Internal Medicine | Admitting: Internal Medicine

## 2017-07-08 VITALS — BP 149/84 | HR 51 | Temp 98.3°F | Resp 16 | Wt 199.0 lb

## 2017-07-08 DIAGNOSIS — R319 Hematuria, unspecified: Secondary | ICD-10-CM

## 2017-07-08 DIAGNOSIS — E785 Hyperlipidemia, unspecified: Secondary | ICD-10-CM

## 2017-07-08 DIAGNOSIS — Z23 Encounter for immunization: Secondary | ICD-10-CM

## 2017-07-08 DIAGNOSIS — N50811 Right testicular pain: Secondary | ICD-10-CM

## 2017-07-08 DIAGNOSIS — I10 Essential (primary) hypertension: Secondary | ICD-10-CM

## 2017-07-08 DIAGNOSIS — N529 Male erectile dysfunction, unspecified: Secondary | ICD-10-CM

## 2017-07-08 MED ORDER — AMLODIPINE BESYLATE 5 MG PO TABS: 5.0000 mg | ORAL_TABLET | Freq: Every day | ORAL | 3 refills | Status: DC

## 2017-07-08 MED ORDER — SILDENAFIL CITRATE 100 MG PO TABS: ORAL_TABLET | ORAL | 2 refills | Status: DC

## 2017-07-08 MED FILL — AMLODIPINE BESYLATE 5 MG TA: 5 | 30 days supply | Qty: 30 | Fill #0

## 2017-07-08 MED FILL — !VIAGRA 100MG TABLET: 100 | 30 days supply | Qty: 3 | Fill #0

## 2017-07-08 NOTE — Patient Instructions (Signed)
Influenza Virus Vaccine injection (Fluarix) Qu es este medicamento? La VACUNA ANTIGRIPAL ayuda a disminuir el riesgo de contraer la influenza, tambin conocida como la gripe. La vacuna solo ayuda a protegerle contra algunas cepas de influenza. Esta vacuna no ayuda a reducir el riesgo de contraer influenza pandmica H1N1. Este medicamento puede ser utilizado para otros usos; si tiene alguna pregunta consulte con su proveedor de atencin mdica o con su farmacutico. MARCAS COMUNES: Fluarix, Fluzone Qu le debo informar a mi profesional de la salud antes de tomar este medicamento? Necesita saber si usted presenta alguno de los siguientes problemas o situaciones: -trastorno de sangrado como hemofilia -fiebre o infeccin -sndrome de Guillain-Barre u otros problemas neurolgicos -problemas del sistema inmunolgico -infeccin por el virus de la inmunodeficiencia humana (VIH) o SIDA -niveles bajos de plaquetas en la sangre -esclerosis mltiple -una reaccin alrgica o inusual a las vacunas antigripales, a los huevos, protenas de pollo, al ltex, a la gentamicina, a otros medicamentos, alimentos, colorantes o conservantes -si est embarazada o buscando quedar embarazada -si est amamantando a un beb Cmo debo utilizar este medicamento? Esta vacuna se administra mediante inyeccin por va intramuscular. Lo administra un profesional de la salud. Recibir una copia de informacin escrita sobre la vacuna antes de cada vacuna. Asegrese de leer este folleto cada vez cuidadosamente. Este folleto puede cambiar con frecuencia. Hable con su pediatra para informarse acerca del uso de este medicamento en nios. Puede requerir atencin especial. Sobredosis: Pngase en contacto inmediatamente con un centro toxicolgico o una sala de urgencia si usted cree que haya tomado demasiado medicamento. ATENCIN: Este medicamento es solo para usted. No comparta este medicamento con nadie. Qu sucede si me olvido de  una dosis? No se aplica en este caso. Qu puede interactuar con este medicamento? -quimioterapia o radioterapia -medicamentos que suprimen el sistema inmunolgico, tales como etanercept, anakinra, infliximab y adalimumab -medicamentos que tratan o previenen cogulos sanguneos, como warfarina -fenitona -medicamentos esteroideos, como la prednisona o la cortisona -teofilina -vacunas Puede ser que esta lista no menciona todas las posibles interacciones. Informe a su profesional de la salud de todos los productos a base de hierbas, medicamentos de venta libre o suplementos nutritivos que est tomando. Si usted fuma, consume bebidas alcohlicas o si utiliza drogas ilegales, indqueselo tambin a su profesional de la salud. Algunas sustancias pueden interactuar con su medicamento. A qu debo estar atento al usar este medicamento? Informe a su mdico o a su profesional de la salud sobre todos los efectos secundarios que persistan despus de 3 das. Llame a su proveedor de atencin mdica si se presentan sntomas inusuales dentro de las 6 semanas posteriores a la vacunacin. Es posible que todava pueda contraer la gripe, pero la enfermedad no ser tan fuerte como normalmente. No puede contraer la gripe de esta vacuna. La vacuna antigripal no le protege contra resfros u otras enfermedades que pueden causar fiebre. Debe vacunarse cada ao. Qu efectos secundarios puedo tener al utilizar este medicamento? Efectos secundarios que debe informar a su mdico o a su profesional de la salud tan pronto como sea posible: -reacciones alrgicas como erupcin cutnea, picazn o urticarias, hinchazn de la cara, labios o lengua Efectos secundarios que, por lo general, no requieren atencin mdica (debe informarlos a su mdico o a su profesional de la salud si persisten o si son molestos): -fiebre -dolor de cabeza -molestias y dolores musculares -dolor, sensibilidad, enrojecimiento o hinchazn en el lugar de la  inyeccin -cansancio o debilidad Puede ser que esta lista no   menciona todos los posibles efectos secundarios. Comunquese a su mdico por asesoramiento mdico sobre los efectos secundarios. Usted puede informar los efectos secundarios a la FDA por telfono al 1-800-FDA-1088. Dnde debo guardar mi medicina? Esta vacuna se administra solamente en clnicas, farmacias, consultorio mdico u otro consultorio de un profesional de la salud y no necesitar guardarlo en su domicilio. ATENCIN: Este folleto es un resumen. Puede ser que no cubra toda la posible informacin. Si usted tiene preguntas acerca de esta medicina, consulte con su mdico, su farmacutico o su profesional de la salud.  2018 Elsevier/Gold Standard (2010-04-21 15:31:40)  Vacuna Td (contra la difteria y el ttanos): Lo que debe saber (Td Vaccine [Tetanus and Diphtheria]: What You Need to Know) 1. Por qu vacunarse? El ttanos y la difteria son enfermedades muy graves. Son poco frecuentes en los [place] actualmente, pero las personas que se infectan suelen tener complicaciones graves. La vacuna Td se usa para proteger a los adolescentes y a los adultos de ambas enfermedades. Tanto el ttanos como la difteria son infecciones causadas por bacterias. La difteria se transmite de persona a persona a travs de la tos o el estornudo. La bacteria que causa el ttanos entra al cuerpo a travs de cortes, raspones o heridas. El TTANOS (trismo) provoca entumecimiento y contraccin dolorosa de los msculos, por lo general, en todo el cuerpo.  Puede causar el endurecimiento de los msculos de la cabeza y el cuello, de modo que impide abrir la boca, tragar y en algunos casos, respirar. El ttanos es causa de muerte en aproximadamente 1de cada 10personas que contraen la infeccin, incluso despus de que reciben la mejor atencin mdica. La DIFTERIA puede hacer que se forme una membrana gruesa en la parte posterior de la garganta.  Puede causar  problemas respiratorios, parlisis, insuficiencia cardaca e incluso la muerte. Antes de las vacunas, en los [place] se informaban 200000 casos de difteria y cientos de casos de ttanos cada ao. Desde que comenz la vacunacin, los informes de casos de ambas enfermedades se han reducido en un 99%. 2. Vacuna Td La vacuna Td protege a adolescentes y adultos contra el ttanos y la difteria. La vacuna Td habitualmente se aplica como dosis de refuerzo cada 10aos, pero tambin puede administrarse antes si la persona sufre una quemadura o herida sucia y grave. A veces, en lugar de la vacuna Td, se recomienda una vacuna llamada Tdap, que protege contra la tosferina, adems de proteger contra el ttanos y la difteria. El mdico o la persona que le aplique la vacuna puede darle ms informacin al respecto. La Td puede administrarse de manera segura simultneamente con otras vacunas. 3. Algunas personas no deben recibir la vacuna  Una persona que alguna vez ha tenido una reaccin alrgica potencialmente mortal a una dosis anterior de cualquier vacuna contra el ttanos o la difteria, O que tenga una alergia grave a cualquier parte de esta vacuna, no debe recibir la vacuna Td. Informe a la persona que le aplica la vacuna si usted tiene cualquier alergia grave.  Consulte con su mdico si: ? tuvo hinchazn o dolor intenso despus de recibir cualquier vacuna contra la difteria o el ttanos, ? alguna vez ha sufrido el sndrome de Guillain-Barr, ? no se siente bien el da en que se ha programado la vacuna.  4. Riesgos de una reaccin a la vacuna Con cualquier medicamento, incluyendo las vacunas, existe la posibilidad de que aparezcan efectos secundarios. Suelen ser leves y desaparecen por s solos. Tambin   son posibles las reacciones graves, pero en raras ocasiones. La mayora de las personas a las que se les aplica la vacuna Td no tienen ningn problema. Problemas leves despus de la vacuna Td: (No  interfirieron en otras actividades)  Dolor en el lugar donde se aplic la vacuna (alrededor de 8de cada 10personas)  Enrojecimiento o hinchazn en el lugar donde se aplic la vacuna (alrededor de 1de cada 4personas)  Fiebre leve (poco frecuente)  Dolor de cabeza (alrededor de 1de cada 4personas)  Cansancio (alrededor de 1de cada 4personas) Problemas moderados despus de la vacuna Td: (Interfirieron en otras actividades, pero no requirieron atencin mdica)  Fiebre superior a 102F (38,8C) (poco frecuente) Problemas graves despus de la vacuna Td: (Impidieron realizar las actividades habituales; requirieron atencin mdica)  Hinchazn, dolor intenso, sangrado o enrojecimiento en el brazo en que se aplic la vacuna (poco frecuente). Problemas que podran ocurrir despus de cualquier vacuna:  Las personas a veces se desmayan despus de un procedimiento mdico, incluida la vacunacin. Si permanece sentado o recostado durante 15 minutos puede ayudar a evitar los desmayos y las lesiones causadas por las cadas. Informe al mdico si se siente mareado, tiene cambios en la visin o zumbidos en los odos.  Algunas personas sienten un dolor intenso en el hombro y tienen dificultad para mover el brazo donde se coloc la vacuna. Esto sucede con muy poca frecuencia.  Cualquier medicamento puede causar una reaccin alrgica grave. Dichas reacciones son muy poco frecuentes con una vacuna (se calcula que menos de 1en un milln de dosis) y se producen de unos minutos a unas horas despus de la administracin. Al igual que con cualquier medicamento, existe una probabilidad muy remota de que una vacuna cause una lesin grave o la muerte. Se controla permanentemente la seguridad de las vacunas. Para obtener ms informacin, visite: www.cdc.gov/vaccinesafety/. 5. Qu pasa si hay una reaccin grave? A qu signos debo estar atento?  Observe todo lo que le preocupe, como signos de una reaccin  alrgica grave, fiebre muy alta o comportamiento fuera de lo normal. Los signos de una reaccin alrgica grave pueden incluir ronchas, hinchazn de la cara y la garganta, dificultad para respirar, latidos cardacos acelerados, mareos y debilidad. Generalmente, estos comenzaran entre unos pocos minutos y algunas horas despus de la vacunacin. Qu debo hacer?  Si usted piensa que se trata de una reaccin alrgica grave o de otra emergencia que no puede esperar, llame al 911 o dirjase al hospital ms cercano. Sino, llame a su mdico.  Despus, la reaccin debe informarse al Sistema de Informacin sobre Efectos Adversos de las Vacunas (Vaccine Adverse Event Reporting System, VAERS). Su mdico puede presentar este informe, o puede hacerlo usted mismo a travs del sitio web de VAERS, en www.vaers.hhs.gov, o llamando al 1-800-822-7967. VAERS no brinda recomendaciones mdicas. 6. Programa Nacional de Compensacin de Daos por Vacunas El Programa Nacional de Compensacin de Daos por Vacunas (National Vaccine Injury Compensation Program, VICP) es un programa federal que fue creado para compensar a las personas que puedan haber sufrido daos al recibir ciertas vacunas. Aquellas personas que consideren que han sufrido un dao como consecuencia de una vacuna y quieran saber ms acerca del programa y de cmo presentar una denuncia, pueden llamar al 1-800-338-2382 o visitar su sitio web en www.hrsa.gov/vaccinecompensation. Hay un lmite de tiempo para presentar un reclamo de compensacin. 7. Cmo puedo obtener ms informacin?  Consulte a su mdico. Este puede darle el prospecto de la vacuna o recomendarle otras fuentes de   informacin.  Comunquese con el servicio de salud de su localidad o su estado.  Comunquese con los Centros para el Control y la Prevencin de Enfermedades (Centers for Disease Control and Prevention , CDC). ? Llame al 1-800-232-4636 (1-800-CDC-INFO). ? Visite el sitio web de los CDC en  www.cdc.gov/vaccines. Declaracin de informacin sobre la vacuna contra la difteria y el ttanos (Td) de los CDC (02/10/16) Esta informacin no tiene como fin reemplazar el consejo del mdico. Asegrese de hacerle al mdico cualquier pregunta que tenga. Document Released: 02/03/2009 Document Revised: 11/08/2014 Document Reviewed: 02/10/2016 Elsevier Interactive Patient Education  2017 Elsevier Inc.   

## 2017-07-08 NOTE — Progress Notes (Signed)
Patient ID: Austin Rubio, male    DOB: 31-Jul-1976  MRN: [id number]  CC: Follow-up   Subjective: Austin Rubio is a 41 y.o. male who presents for chronic ds management. Last seen 03/2017 His concerns today include:  pt with hx of HTN and hematuria.   1. HTN: prescribed Norvasc on last visit. However, pt states he was not aware -does not check BP -not limiting salt in foods -not getting in much exercise  2. Hematuria:  Missed appt with [facility] 06/28/2017. States he did not get the address info from our referral person. -no further episode of blood at tip of penis -intermittent pain in RT testicle  Pain if he wears something tight and if he was preparing to have intercourse and does not. No dysuria -also has problems getting erection 6-7 mths. Same partner. Relationship is good  Patient Active Problem List   Diagnosis Date Noted  . Erectile dysfunction 07/08/2017  . Hyperlipidemia 07/08/2017  . Hematuria 03/18/2017  . Dyslipidemia 06/22/2013  . Unspecified essential hypertension 05/17/2013  . Chronic constipation 05/17/2013  . Hemorrhoids 05/17/2013     No current outpatient prescriptions on file prior to visit.   No current facility-administered medications on file prior to visit.     No Known Allergies  Social History   Social History  . Marital status: Married    Spouse name: N/A  . Number of children: N/A  . Years of education: N/A   Occupational History  . Not on file.   Social History Main Topics  . Smoking status: Never Smoker  . Smokeless tobacco: Never Used  . Alcohol use No     Comment: socially  . Drug use: No  . Sexual activity: Yes    Partners: Female    Birth control/ protection: None   Other Topics Concern  . Not on file   Social History Narrative  . No narrative on file    Family History  Problem Relation Age of Onset  . Diabetes Mother   . Heart disease Father     Past Surgical History:  Procedure Laterality  Date  . HERNIA REPAIR      ROS: Review of Systems Negative except as stated above PHYSICAL EXAM: BP (!) 149/84   Pulse (!) 51   Temp 98.3 F (36.8 C) (Oral)   Resp 16   Wt 199 lb (90.3 kg)   SpO2 97%   BMI 32.61 kg/m   Physical Exam General appearance - alert, well appearing, and in no distress Mental status - alert, oriented to person, place, and time, normal mood, behavior, speech, dress, motor activity, and thought processes Neck - supple, no significant adenopathy Chest - clear to auscultation, no wheezes, rales or rhonchi, symmetric air entry Heart - normal rate, regular rhythm, normal S1, S2, no murmurs, rubs, clicks or gallops GU 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 +prominent epididymus or varicocele palpated Extremities - no LE edema  Results for orders placed or performed in visit on 03/18/17  CBC  Result Value Ref Range   WBC 7.0 3.4 - 10.8 x10E3/uL   RBC 5.51 4.14 - 5.80 x10E6/uL   Hemoglobin 16.5 13.0 - 17.7 g/dL   Hematocrit 48.9 37.5 - 51.0 %   MCV 89 79 - 97 fL   MCH 29.9 26.6 - 33.0 pg   MCHC 33.7 31.5 - 35.7 g/dL   RDW 13.8 12.3 - 15.4 %   Platelets 253 150 -  379 x10E3/uL  Comprehensive metabolic panel  Result Value Ref Range   Glucose 84 65 - 99 mg/dL   BUN 16 6 - 24 mg/dL   Creatinine, Ser 0.92 0.76 - 1.27 mg/dL   GFR calc non Af Amer 102 >59 mL/min/1.73   GFR calc Af Amer 118 >59 mL/min/1.73   BUN/Creatinine Ratio 17 9 - 20   Sodium 141 134 - 144 mmol/L   Potassium 4.2 3.5 - 5.2 mmol/L   Chloride 104 96 - 106 mmol/L   CO2 26 18 - 29 mmol/L   Calcium 8.8 8.7 - 10.2 mg/dL   Total Protein 6.6 6.0 - 8.5 g/dL   Albumin 4.1 3.5 - 5.5 g/dL   Globulin, Total 2.5 1.5 - 4.5 g/dL   Albumin/Globulin Ratio 1.6 1.2 - 2.2   Bilirubin Total 0.6 0.0 - 1.2 mg/dL   Alkaline Phosphatase 80 39 - 117 IU/L   AST 23 0 - 40 IU/L   ALT 28 0 - 44 IU/L  Lipid panel  Result Value Ref Range   Cholesterol, Total 189 100 - 199 mg/dL    Triglycerides 99 0 - 149 mg/dL   HDL 41 >39 mg/dL   VLDL Cholesterol Cal 20 5 - 40 mg/dL   LDL Calculated 128 (H) 0 - 99 mg/dL   Chol/HDL Ratio 4.6 0.0 - 5.0 ratio  HIV antibody  Result Value Ref Range   HIV Screen 4th Generation wRfx Non Reactive Non Reactive  POCT urinalysis dipstick  Result Value Ref Range   Color, UA light yellow (A) yellow   Clarity, UA clear clear   Glucose, UA negative negative mg/dL   Bilirubin, UA negative negative   Ketones, POC UA negative negative mg/dL   Spec Grav, UA 1.015 1.010 - 1.025   Blood, UA negative negative   pH, UA 7.0 5.0 - 8.0   Protein Ur, POC negative negative mg/dL   Urobilinogen, UA 0.2 0.2 or 1.0 E.U./dL   Nitrite, UA Negative Negative   Leukocytes, UA Negative Negative   ASSESSMENT AND PLAN: 1. Hematuria, unspecified type Even though no further episode, I thin he still needs work up - Ambulatory referral to Urology  2. Erectile dysfunction, unspecified erectile dysfunction type - sildenafil (VIAGRA) 100 MG tablet; 1/2-1  tab 30 mins to 1 hr prior to intercourse. Limit use to 1 tab/24 hrs.  Dispense: 10 tablet; Refill: 2 Pt advised of possible side effects of this medication including prolong erection, flushing, headaches, stuff nose and sudden vision and hearing changes.  Pt told to be seen in ER if he has erection lasting longer than 3-4 hrs, or if he has sudden vision changes or hearing loss.  - [place] SCROTOM DOPPLER; Future  3. Testicular pain, right - US Scrotum; Future - Ambulatory referral to Urology  4. Essential hypertension Stat Norvasc but at 5 mg Recommend limiting salt in foods - amLODipine (NORVASC) 5 MG tablet; Take 1 tablet (5 mg total) by mouth daily.  Dispense: 30 tablet; Refill: 3  5. Need for Tdap vaccination - Tdap vaccine greater than or equal to 7yo IM  6. Need for influenza vaccination  Flu Vaccine QUAD 6+ mos PF IM (Fluarix Quad PF)  7. Hyperlipidemia, unspecified hyperlipidemia type Discussed labs  from last visit Recommend healthy eating habits and regular exercise  Patient was given the opportunity to ask questions.  Patient verbalized understanding of the plan and was able to repeat key elements of the plan.  [employer] interpreter used during this encounter.  Orders Placed This Encounter  Procedures  . US Scrotum  . [place] SCROTOM DOPPLER  . Flu Vaccine QUAD 6+ mos PF IM (Fluarix Quad PF)  . Tdap vaccine greater than or equal to 7yo IM  . Ambulatory referral to Urology     Requested Prescriptions   Signed Prescriptions Disp Refills  . amLODipine (NORVASC) 5 MG tablet 30 tablet 3    Sig: Take 1 tablet (5 mg total) by mouth daily.  . sildenafil (VIAGRA) 100 MG tablet 10 tablet 2    Sig: 1/2-1  tab 30 mins to 1 hr prior to intercourse. Limit use to 1 tab/24 hrs.    No Follow-up on file.  Karle Plumber, MD, FACP

## 2017-07-12 ENCOUNTER — Ambulatory Visit (HOSPITAL_COMMUNITY): Payer: Self-pay

## 2017-09-30 ENCOUNTER — Ambulatory Visit: Payer: Self-pay

## 2017-11-30 MED FILL — AMLODIPINE BESYLATE 10 MG T: 10 | 30 days supply | Qty: 30 | Fill #0

## 2018-01-11 ENCOUNTER — Ambulatory Visit: Payer: Self-pay | Attending: Internal Medicine | Admitting: Physician Assistant

## 2018-01-11 VITALS — BP 137/80 | HR 73 | Temp 98.7°F | Resp 18 | Ht 65.0 in | Wt 199.0 lb

## 2018-01-11 DIAGNOSIS — I10 Essential (primary) hypertension: Secondary | ICD-10-CM

## 2018-01-11 DIAGNOSIS — Z79899 Other long term (current) drug therapy: Secondary | ICD-10-CM | POA: Insufficient documentation

## 2018-01-11 DIAGNOSIS — J301 Allergic rhinitis due to pollen: Secondary | ICD-10-CM

## 2018-01-11 MED ORDER — FLUTICASONE PROPIONATE 50 MCG/ACT NA SUSP
2.0000 | Freq: Every day | NASAL | 6 refills | Status: DC
Start: 2018-01-11 — End: 2019-12-24

## 2018-01-11 MED ORDER — CETIRIZINE HCL 10 MG PO TABS: 10.0000 mg | ORAL_TABLET | Freq: Every day | ORAL | 11 refills | Status: DC

## 2018-01-11 MED FILL — ?CETIRIZINE HCL 10 MG TABLE: 10 | 30 days supply | Qty: 30 | Fill #0

## 2018-01-11 MED FILL — FLUTICASONE PROP 50 MCG SPR: 50 | 30 days supply | Qty: 16 | Fill #0

## 2018-01-11 NOTE — Progress Notes (Signed)
Patient ID: Austin Rubio, male   DOB: May 17, 1976, 42 y.o.   MRN: [id number]      Austin Rubio, is a 42 y.o. male  YPP:[id number]  WPY:[id number]  DOB - 07/09/1976  Subjective:  Chief Complaint and HPI: Austin Rubio is a 42 y.o. male here today for concern with getting allergy symptoms when allergy season starts. Allergic symptoms-itching eyes, sneezing, and PND when he gets allergies.  His wife made the appt prior to his symptoms starting so he could have prescriptions ready.  No complaints currently.  Zyrtec always helps  He has only been taking 1/2 of his 10mg  amlodipine tablets.    [employer] interpreters "[place]" interpreting.   ROS:   Constitutional:  No f/c, No night sweats, No unexplained weight loss. EENT:  No vision changes, No blurry vision, No hearing changes. No mouth, throat, or ear problems.  Respiratory: No cough, No SOB Cardiac: No CP, no palpitations GI:  No abd pain, No N/V/D. GU: No Urinary s/sx Musculoskeletal: No joint pain Neuro: No headache, no dizziness, no motor weakness.  Skin: No rash Endocrine:  No polydipsia. No polyuria.  Psych: Denies SI/HI  Problem  Essential Hypertension    ALLERGIES: No Known Allergies  PAST MEDICAL HISTORY: Past Medical History:  Diagnosis Date  . Unspecified essential hypertension 05/17/2013    MEDICATIONS AT HOME: Prior to Admission medications   Medication Sig Start Date End Date Taking? Authorizing Provider  sildenafil (VIAGRA) 100 MG tablet 1/2-1  tab 30 mins to 1 hr prior to intercourse. Limit use to 1 tab/24 hrs. 07/08/17  Yes Ladell Pier, MD  amLODipine (NORVASC) 10 MG tablet Take 10 mg by mouth 1 day or 1 dose. 11/30/17   [provider]  cetirizine (ZYRTEC) 10 MG tablet Take 1 tablet (10 mg total) by mouth daily. 01/11/18   Argentina Donovan, PA-C  fluticasone (FLONASE) 50 MCG/ACT nasal spray Place 2 sprays into both nostrils daily. 01/11/18   Argentina Donovan, PA-C     Objective:   EXAM:   Vitals:   01/11/18 1625  BP: 137/80  Pulse: 73  Resp: 18  Temp: 98.7 F (37.1 C)  TempSrc: Oral  SpO2: 98%  Weight: 199 lb (90.3 kg)  Height: 5\' 5"  (1.651 m)    General appearance : A&OX3. NAD. Non-toxic-appearing HEENT: Atraumatic and Normocephalic.  PERRLA. EOM intact.  TM clear B. Mouth-MMM, post pharynx WNL w/o erythema, No PND. Neck: supple, no JVD. No cervical lymphadenopathy. No thyromegaly Chest/Lungs:  Breathing-non-labored, Good air entry bilaterally, breath sounds normal without rales, rhonchi, or wheezing  CVS: S1 S2 regular, no murmurs, gallops, rubs  Extremities: Bilateral Lower Ext shows no edema, both legs are warm to touch with = pulse throughout Neurology:  CN II-XII grossly intact, Non focal.   Psych:  TP linear. J/I WNL. Normal speech. Appropriate eye contact and affect.  Skin:  No Rash  Data Review Lab Results  Component Value Date   HGBA1C 5.5% 05/17/2013     Assessment & Plan   1. Allergic rhinitis due to pollen, unspecified seasonality Start when allergy season hits! - cetirizine (ZYRTEC) 10 MG tablet; Take 1 tablet (10 mg total) by mouth daily.  Dispense: 30 tablet; Refill: 11 - fluticasone (FLONASE) 50 MCG/ACT nasal spray; Place 2 sprays into both nostrils daily.  Dispense: 16 g; Refill: 6  2. Essential hypertension Not at goal but not taking as currently prescribed.  He should be taking a 10 mg amlodipine but has  only been taking 1/2.  I have instructed him to take a whole tablet.    Patient have been counseled extensively about nutrition and exercise  Return in about 3 months (around 04/13/2018) for Dr Wynetta Emery; BP.  The patient was given clear instructions to go to ER or return to medical center if symptoms don't improve, worsen or new problems develop. The patient verbalized understanding. The patient was told to call to get lab results if they haven't heard anything in the next week.     Freeman Caldron, PA-C [facility] and [place], [place]   01/11/2018, 4:37 PM

## 2018-02-24 MED FILL — ?CETIRIZINE HCL 10 MG TABLE: 10 | 30 days supply | Qty: 30 | Fill #1

## 2018-02-24 MED FILL — AMLODIPINE BESYLATE 10 MG T: 10 | 30 days supply | Qty: 30 | Fill #1

## 2018-04-13 ENCOUNTER — Encounter: Payer: Self-pay | Admitting: Internal Medicine

## 2018-04-13 ENCOUNTER — Ambulatory Visit: Payer: Self-pay | Attending: Internal Medicine | Admitting: Internal Medicine

## 2018-04-13 VITALS — BP 128/85 | HR 79 | Temp 97.6°F | Resp 16 | Wt 204.2 lb

## 2018-04-13 DIAGNOSIS — E785 Hyperlipidemia, unspecified: Secondary | ICD-10-CM | POA: Insufficient documentation

## 2018-04-13 DIAGNOSIS — Z9889 Other specified postprocedural states: Secondary | ICD-10-CM | POA: Insufficient documentation

## 2018-04-13 DIAGNOSIS — E669 Obesity, unspecified: Secondary | ICD-10-CM | POA: Insufficient documentation

## 2018-04-13 DIAGNOSIS — Z833 Family history of diabetes mellitus: Secondary | ICD-10-CM | POA: Insufficient documentation

## 2018-04-13 DIAGNOSIS — Z6833 Body mass index (BMI) 33.0-33.9, adult: Secondary | ICD-10-CM | POA: Insufficient documentation

## 2018-04-13 DIAGNOSIS — K5909 Other constipation: Secondary | ICD-10-CM | POA: Insufficient documentation

## 2018-04-13 DIAGNOSIS — Z8249 Family history of ischemic heart disease and other diseases of the circulatory system: Secondary | ICD-10-CM | POA: Insufficient documentation

## 2018-04-13 DIAGNOSIS — R319 Hematuria, unspecified: Secondary | ICD-10-CM | POA: Insufficient documentation

## 2018-04-13 DIAGNOSIS — Z79899 Other long term (current) drug therapy: Secondary | ICD-10-CM | POA: Insufficient documentation

## 2018-04-13 DIAGNOSIS — N529 Male erectile dysfunction, unspecified: Secondary | ICD-10-CM | POA: Insufficient documentation

## 2018-04-13 DIAGNOSIS — I10 Essential (primary) hypertension: Secondary | ICD-10-CM | POA: Insufficient documentation

## 2018-04-13 MED ORDER — AMLODIPINE BESYLATE 10 MG PO TABS: 10.0000 mg | ORAL_TABLET | ORAL | 3 refills | Status: DC

## 2018-04-13 NOTE — Progress Notes (Signed)
Patient ID: Austin Rubio, male    DOB: 1976-04-09  MRN: [id number]  CC: Hypertension   Subjective: Austin Rubio is a 42 y.o. male who presents for chronic ds management His concerns today include:  pt with hx of HTN,  and seasonal allergies  HTN:  Compliant with Norvasc and salt restriction. No CP/SOB/LE edema/HA Active at work.  Hangs drywall  Allergies:  Did well with Zyrtec and Flonase Patient Active Problem List   Diagnosis Date Noted  . Erectile dysfunction 07/08/2017  . Hyperlipidemia 07/08/2017  . Hematuria 03/18/2017  . Dyslipidemia 06/22/2013  . Essential hypertension 05/17/2013  . Chronic constipation 05/17/2013  . Hemorrhoids 05/17/2013     Current Outpatient Medications on File Prior to Visit  Medication Sig Dispense Refill  . cetirizine (ZYRTEC) 10 MG tablet Take 1 tablet (10 mg total) by mouth daily. 30 tablet 11  . fluticasone (FLONASE) 50 MCG/ACT nasal spray Place 2 sprays into both nostrils daily. 16 g 6  . sildenafil (VIAGRA) 100 MG tablet 1/2-1  tab 30 mins to 1 hr prior to intercourse. Limit use to 1 tab/24 hrs. 10 tablet 2   No current facility-administered medications on file prior to visit.     No Known Allergies  Social History   Socioeconomic History  . Marital status: Married    Spouse name: Not on file  . Number of children: Not on file  . Years of education: Not on file  . Highest education level: Not on file  Occupational History  . Not on file  Social Needs  . Financial resource strain: Not on file  . Food insecurity:    Worry: Not on file    Inability: Not on file  . Transportation needs:    Medical: Not on file    Non-medical: Not on file  Tobacco Use  . Smoking status: Never Smoker  . Smokeless tobacco: Never Used  Substance and Sexual Activity  . Alcohol use: No    Comment: socially  . Drug use: No  . Sexual activity: Yes    Partners: Female    Birth control/protection: None  Lifestyle  . Physical  activity:    Days per week: Not on file    Minutes per session: Not on file  . Stress: Not on file  Relationships  . Social connections:    Talks on phone: Not on file    Gets together: Not on file    Attends religious service: Not on file    Active member of club or organization: Not on file    Attends meetings of clubs or organizations: Not on file    Relationship status: Not on file  . Intimate partner violence:    Fear of current or ex partner: Not on file    Emotionally abused: Not on file    Physically abused: Not on file    Forced sexual activity: Not on file  Other Topics Concern  . Not on file  Social History Narrative  . Not on file    Family History  Problem Relation Age of Onset  . Diabetes Mother   . Heart disease Father     Past Surgical History:  Procedure Laterality Date  . HERNIA REPAIR      ROS: Review of Systems Neg except as stated above PHYSICAL EXAM: BP 128/85   Pulse 79   Temp 97.6 F (36.4 C) (Oral)   Resp 16   Wt 204 lb 3.2 oz (92.6 kg)  SpO2 96%   BMI 33.98 kg/m   Wt Readings from Last 3 Encounters:  04/13/18 204 lb 3.2 oz (92.6 kg)  01/11/18 199 lb (90.3 kg)  07/08/17 199 lb (90.3 kg)    Physical Exam  General appearance - alert, well appearing, and in no distress Mental status - alert, oriented to person, place, and time, normal mood, behavior, speech, dress, motor activity, and thought processes Chest - clear to auscultation, no wheezes, rales or rhonchi, symmetric air entry Heart - normal rate, regular rhythm, normal S1, S2, no murmurs, rubs, clicks or gallops Extremities - peripheral pulses normal, no pedal edema, no clubbing or cyanosis    Chemistry      Component Value Date/Time   NA 141 03/22/2017 0840   K 4.2 03/22/2017 0840   CL 104 03/22/2017 0840   CO2 26 03/22/2017 0840   BUN 16 03/22/2017 0840   CREATININE 0.92 03/22/2017 0840   CREATININE 1.02 05/17/2013 1653      Component Value Date/Time   CALCIUM 8.8  03/22/2017 0840   ALKPHOS 80 03/22/2017 0840   AST 23 03/22/2017 0840   ALT 28 03/22/2017 0840   BILITOT 0.6 03/22/2017 0840     Lab Results  Component Value Date   CHOL 189 03/22/2017   HDL 41 03/22/2017   LDLCALC 128 (H) 03/22/2017   TRIG 99 03/22/2017   CHOLHDL 4.6 03/22/2017    ASSESSMENT AND PLAN: 1. Essential hypertension Close to goal.  continue Norvasc - CBC; Future - Comprehensive metabolic panel; Future - Lipid panel; Future - amLODipine (NORVASC) 10 MG tablet; Take 1 tablet (10 mg total) by mouth 1 day or 1 dose.  Dispense: 90 tablet; Refill: 3  2. Hyperlipidemia, unspecified hyperlipidemia type -See #3 below.  3. Class 1 obesity without serious comorbidity with body mass index (BMI) of 33.0 to 33.9 in adult, unspecified obesity type Discussed healthy eating habits with him.  Advised to cut back on white carbohydrates and sugary drinks.   Patient was given the opportunity to ask questions.  Patient verbalized understanding of the plan and was able to repeat key elements of the plan.   Orders Placed This Encounter  Procedures  . CBC  . Comprehensive metabolic panel  . Lipid panel     Requested Prescriptions   Signed Prescriptions Disp Refills  . amLODipine (NORVASC) 10 MG tablet 90 tablet 3    Sig: Take 1 tablet (10 mg total) by mouth 1 day or 1 dose.    Return in about 4 months (around 08/13/2018).  Karle Plumber, MD, FACP

## 2018-04-14 MED FILL — AMLODIPINE BESYLATE 10 MG T: 10 | 30 days supply | Qty: 30 | Fill #0

## 2018-04-19 ENCOUNTER — Ambulatory Visit: Payer: Self-pay | Attending: Internal Medicine

## 2018-05-29 MED FILL — AMLODIPINE BESYLATE 10 MG T: 10 | 30 days supply | Qty: 30 | Fill #1

## 2019-07-06 ENCOUNTER — Ambulatory Visit: Payer: Self-pay | Admitting: Family Medicine

## 2019-08-03 ENCOUNTER — Encounter: Payer: Self-pay | Admitting: Family Medicine

## 2019-08-03 ENCOUNTER — Other Ambulatory Visit: Payer: Self-pay

## 2019-08-03 ENCOUNTER — Ambulatory Visit: Payer: Self-pay | Attending: Family Medicine | Admitting: Family Medicine

## 2019-08-03 VITALS — BP 171/101 | HR 73 | Temp 98.2°F | Ht 65.0 in | Wt 211.0 lb

## 2019-08-03 DIAGNOSIS — K439 Ventral hernia without obstruction or gangrene: Secondary | ICD-10-CM

## 2019-08-03 DIAGNOSIS — I10 Essential (primary) hypertension: Secondary | ICD-10-CM

## 2019-08-03 DIAGNOSIS — Z789 Other specified health status: Secondary | ICD-10-CM

## 2019-08-03 DIAGNOSIS — Z758 Other problems related to medical facilities and other health care: Secondary | ICD-10-CM

## 2019-08-03 DIAGNOSIS — Z603 Acculturation difficulty: Secondary | ICD-10-CM

## 2019-08-03 MED ORDER — AMLODIPINE BESYLATE 10 MG PO TABS: ORAL_TABLET | ORAL | 1 refills | Status: DC

## 2019-08-03 MED FILL — AMLODIPINE BESYLATE 10 MG T: 10 | 30 days supply | Qty: 30 | Fill #0

## 2019-08-03 NOTE — Progress Notes (Signed)
Established Patient Office Visit  Subjective:  Patient ID: Austin Rubio, male    DOB: 11-15-1975  Age: 43 y.o. MRN: [id number]  CC:  Chief Complaint  Patient presents with  . Hypertension   -Due to a language barrier, patient is accompanied by a Spanish-speaking interpreter at today's visit  HPI Artemis Carpenito presents in follow-up of hypertension and hyperlipidemia for which she was last seen in the office by his primary care provider Dr. Wynetta Emery on 04/13/2018.  Per chart, his last lipid panel was 03/22/2017 and his LDL was 128 at that time. Patient with normal complete metabolic panel done [id number].      Patient reports that he has been out of his blood pressure medication for about 3 months after his financial assistance program enrollment expired.  He reports that he has made an appointment to reapply for the financial assistance program.  He has had mild dizziness on occasion since being out of blood pressure medication but no headaches.  No episodes of focal numbness or weakness, no facial drooping or speech impairment episodes.  He reports that he tolerated the most recent blood pressure medication well but in the past he was on a blood pressure medication which did cause some stomach upset.       He reports that about 6 years ago he had repair of a right inguinal hernia.  Over the past few months, he has had onset of recurrent episodes of pain in his mid abdomen just above his bellybutton.  He believes that he has a hernia in this area.  He reports that he will have pain and tightness in this area that can last for a few hours at times and then the area will become soft again and nonpainful.  He denies any issues with nausea, constipation or diarrhea.  On further review of systems, he denies any chest pain or palpitations, no shortness of breath or cough, no recent fever or chills and no other medical concerns.        Past Medical History:  Diagnosis Date  . Unspecified essential  hypertension 05/17/2013    Past Surgical History:  Procedure Laterality Date  . HERNIA REPAIR      Family History  Problem Relation Age of Onset  . Diabetes Mother   . Heart disease Father     Social History   Socioeconomic History  . Marital status: Married    Spouse name: Not on file  . Number of children: Not on file  . Years of education: Not on file  . Highest education level: Not on file  Occupational History  . Not on file  Social Needs  . Financial resource strain: Not on file  . Food insecurity    Worry: Not on file    Inability: Not on file  . Transportation needs    Medical: Not on file    Non-medical: Not on file  Tobacco Use  . Smoking status: Never Smoker  . Smokeless tobacco: Never Used  Substance and Sexual Activity  . Alcohol use: No    Comment: socially  . Drug use: No  . Sexual activity: Yes    Partners: Female    Birth control/protection: None  Lifestyle  . Physical activity    Days per week: Not on file    Minutes per session: Not on file  . Stress: Not on file  Relationships  . Social connections    Talks on phone: Not on file  Gets together: Not on file    Attends religious service: Not on file    Active member of club or organization: Not on file    Attends meetings of clubs or organizations: Not on file    Relationship status: Not on file  . Intimate partner violence    Fear of current or ex partner: Not on file    Emotionally abused: Not on file    Physically abused: Not on file    Forced sexual activity: Not on file  Other Topics Concern  . Not on file  Social History Narrative  . Not on file    Outpatient Medications Prior to Visit  Medication Sig Dispense Refill  . amLODipine (NORVASC) 10 MG tablet Take 1 tablet (10 mg total) by mouth 1 day or 1 dose. 90 tablet 3  . cetirizine (ZYRTEC) 10 MG tablet Take 1 tablet (10 mg total) by mouth daily. 30 tablet 11  . fluticasone (FLONASE) 50 MCG/ACT nasal spray Place 2 sprays  into both nostrils daily. 16 g 6  . sildenafil (VIAGRA) 100 MG tablet 1/2-1  tab 30 mins to 1 hr prior to intercourse. Limit use to 1 tab/24 hrs. 10 tablet 2   No facility-administered medications prior to visit.     No Known Allergies  ROS Review of Systems  Constitutional: Negative for chills and fever.  HENT: Negative for sore throat and trouble swallowing.   Respiratory: Negative for cough and shortness of breath.   Cardiovascular: Negative for chest pain, palpitations and leg swelling.  Gastrointestinal: Positive for abdominal pain. Negative for blood in stool, constipation, diarrhea and nausea.  Endocrine: Negative for cold intolerance, heat intolerance, polydipsia, polyphagia and polyuria.  Genitourinary: Negative for dysuria and frequency.  Musculoskeletal: Negative for arthralgias and back pain.  Neurological: Positive for dizziness. Negative for headaches.  Hematological: Negative for adenopathy. Does not bruise/bleed easily.      Objective:    Physical Exam  Constitutional: He is oriented to person, place, and time. He appears well-developed and well-nourished.  Neck: Normal range of motion. Neck supple. No JVD present.  Cardiovascular: Normal rate and regular rhythm.  Pulmonary/Chest: Effort normal and breath sounds normal.  Abdominal: Soft. He exhibits distension. There is abdominal tenderness. There is no rebound and no guarding.  Patient with truncal obesity.  Patient has a area above the umbilicus that has a slight bulge and is slightly tender to palpation but I could not appreciate an actual hernia defect on exam  Musculoskeletal:        General: No tenderness or edema.     Comments: No CVA tenderness  Lymphadenopathy:    He has no cervical adenopathy.  Neurological: He is alert and oriented to person, place, and time.  Skin: Skin is warm and dry.  Psychiatric: He has a normal mood and affect. His behavior is normal.  Nursing note and vitals reviewed.   BP  (!) 171/101   Pulse 73   Temp 98.2 F (36.8 C) (Oral)   Ht 5\' 5"  (1.651 m)   Wt 211 lb (95.7 kg)   SpO2 97%   BMI 35.11 kg/m  Wt Readings from Last 3 Encounters:  08/03/19 211 lb (95.7 kg)  04/13/18 204 lb 3.2 oz (92.6 kg)  01/11/18 199 lb (90.3 kg)     Health Maintenance Due  Topic Date Due  . INFLUENZA VACCINE  06/02/2019   Patient was offered and agrees to have influenza immunization at today's visit and this will be  done as a nurse visit while patient is here in the office   Lab Results  Component Value Date   TSH 1.723 12/22/2009   Lab Results  Component Value Date   WBC 7.0 03/22/2017   HGB 16.5 03/22/2017   HCT 48.9 03/22/2017   MCV 89 03/22/2017   PLT 253 03/22/2017   Lab Results  Component Value Date   NA 141 03/22/2017   K 4.2 03/22/2017   CO2 26 03/22/2017   GLUCOSE 84 03/22/2017   BUN 16 03/22/2017   CREATININE 0.92 03/22/2017   BILITOT 0.6 03/22/2017   ALKPHOS 80 03/22/2017   AST 23 03/22/2017   ALT 28 03/22/2017   PROT 6.6 03/22/2017   ALBUMIN 4.1 03/22/2017   CALCIUM 8.8 03/22/2017   Lab Results  Component Value Date   CHOL 189 03/22/2017   Lab Results  Component Value Date   HDL 41 03/22/2017   Lab Results  Component Value Date   LDLCALC 128 (H) 03/22/2017   Lab Results  Component Value Date   TRIG 99 03/22/2017   Lab Results  Component Value Date   CHOLHDL 4.6 03/22/2017   Lab Results  Component Value Date   HGBA1C 5.5% 05/17/2013      Assessment & Plan:  1. Essential hypertension Discussed the importance of compliance with medication and follow-up appointments with patient at today's visit.  New prescription provided for amlodipine 10 mg which patient took in the past for control of blood pressure.  Educational material given on hypertension in Spanish as part of after visit summary.  Low-sodium diet and regular exercise encouraged. - amLODipine (NORVASC) 10 MG tablet; One pill once per day to lower blood pressure   Dispense: 90 tablet; Refill: 1  2. Ventral hernia without obstruction or gangrene Patient reports some recurrent mid abdominal discomfort/pain above the umbilicus.  Patient with possible ventral hernia.  Discussed with patient that if he has severe pain/tightness in area of hernia that this may represent a medical emergency for which he needs to be seen in the emergency department.  Referral will be made to general surgery for further evaluation and treatment.  Educational material in Spanish on ventral hernias provided to the patient at today's visit.  Patient reports that he had surgery about 6 years ago for repair of right inguinal hernia and he has had no further pain at this site. - Ambulatory referral to General Surgery  3.  Language barrier Patient was accompanied by an interpreter at today's visit due to a language barrier  *Patient was offered and agreed to have influenza immunization at today's visit which will be given as nurse visit  An After Visit Summary was printed and given to the patient.  Follow-up: Return in about 6 weeks (around 09/14/2019) for follow-up hypertension.   Antony Blackbird, MD

## 2019-08-03 NOTE — Patient Instructions (Signed)
Hernia ventral Ventral Hernia  Una hernia ventral es un bulto de tejido del interior del abdomen que empuja y sale a travs de una zona dbil de los msculos que forman la pared abdominal frontal. Los tejidos dentro del abdomen se encuentran en un saco (peritoneo). Estos tejidos incluyen el intestino delgado, el intestino grueso y el tejido graso que recubre el intestino (epipln). Algunas veces, el bulto que forma una hernia contiene intestinos. Otras hernias contienen solo grasa. Las hernias ventrales no desaparecen sin tratamiento quirrgico. Hay varios tipos de hernias ventrales. Es posible que tenga lo siguiente:  Una hernia en el lugar de una incisin de una ciruga abdominal previa (hernia incisional).  Una hernia justo por encima del ombligo (hernia epigstrica), o en el ombligo (hernia umbilical). Estos tipos de hernias pueden desarrollarse por levantar objetos pesados o hacer mucha fuerza.  Una hernia que aparece y desaparece (hernia reducible). Puede ser visible solamente cuando usted levanta algo o hace fuerza. Este tipo de hernia se puede reintroducir en el abdomen (reducir).  Una hernia que aprisiona tejido abdominal (hernia encarcelada). Este tipo de hernia es irreducible.  Una hernia que interrumpe el flujo de sangre a los tejidos en su interior (hernia estrangulada). Si esto ocurre, los tejidos pueden empezar a morir. Se trata de un bulto muy doloroso e irreducible. Una hernia estrangulada es una emergencia mdica. Cules son las causas? Esta afeccin sucede cuando el tejido abdominal presiona una zona dbil de los msculos abdominales. Qu incrementa el riesgo? Los siguientes factores pueden hacer que usted sea ms propenso a tener esta afeccin:  Ser hombre.  Tener 60 aos o ms.  Tener exceso de peso u obesidad.  Haberse sometido a ciruga abdominal en el pasado, especialmente si se produjo una infeccin luego de la ciruga.  Haber sufrido una lesin en la pared  abdominal.  Haber tenido varios embarazos.  Tener una acumulacin de lquido dentro del abdomen (ascitis). Cules son los signos o sntomas? El nico sntoma de una hernia ventral puede ser un bulto indoloro en el abdomen. Una hernia reducible puede ser visible solo al hacer fuerza, toser o levantar objetos. Otros sntomas pueden incluir lo siguiente:  Dolor sordo.  Sensacin de opresin. Los signos y sntomas de una hernia estrangulada pueden incluir los siguientes:  Aumento del dolor.  Nuseas y vmitos.  Dolor al ejercer presin sobre la hernia.  La piel que recubre la hernia se torna roja o violcea.  Estreimiento.  Sangre en la materia fecal (heces). Cmo se diagnostica? Esta afeccin se puede diagnosticar en funcin de lo siguiente:  Sus sntomas.  Sus antecedentes mdicos.  Un examen fsico. Pueden pedirle que tosa o que haga fuerza mientras est de pie. Estas acciones aumentan la presin dentro del abdomen y empujan la hernia a travs de la abertura en los msculos. El mdico puede ejercer presin sobre la hernia para tratar de reducirla.  Estudios de diagnstico por imgenes, como una ecografa o una exploracin por tomografa computarizada (TC). Cmo se trata? Esta afeccin se trata con ciruga. Si la hernia est estrangulada, la ciruga se realiza lo antes posible. Si la hernia es pequea y no est encarcelada, es posible que se le pida que pierda algo de peso antes de la ciruga. Siga estas indicaciones en su casa:  Siga las indicaciones del mdico respecto de las restricciones de comidas o bebidas.  Si tiene sobrepeso, el mdico puede recomendarle que aumente su nivel de actividad y que coma una dieta saludable.  No levante   ningn objeto que pese ms de 10libras (4.5kg).  Reanude sus actividades normales segn lo indicado por el mdico. Pregntele al mdico qu actividades son seguras para usted. Tal vez deba evitar las actividades que aumentan la  presin sobre la hernia.  Tome los medicamentos de venta libre y los recetados solamente como se lo haya indicado el mdico.  Consulting civil engineer a todas las visitas de control como se lo haya indicado el mdico. Esto es importante. Comunquese con un mdico si:  La hernia se agranda.  La hernia le causa dolor. Solicite ayuda de inmediato si:  La hernia duele cada vez ms.  Tiene dolor junto con cualquiera de los siguientes sntomas: ? Cambios en el color de la piel en la zona de la hernia. ? Nuseas. ? Vmitos. ? Fiebre. Resumen  Una hernia ventral es un bulto de tejido del interior del abdomen que empuja y sale a travs de una zona dbil de los msculos que forman la pared abdominal frontal.  Esta afeccin se trata con ciruga, que puede ser de urgencia segn la hernia.  No levante nada que pese ms de 10 libras (4.5 kg), y siga las indicaciones del mdico sobre la [place]. Esta informacin no tiene Marine scientist el consejo del mdico. Asegrese de hacerle al mdico cualquier pregunta que tenga. Document Released: 02/12/2013 Document Revised: 01/30/2018 Document Reviewed: 08/19/2017 Elsevier Patient Education  [place].  Hipertensin en los adultos Hypertension, Adult El trmino hipertensin es otra forma de denominar a la presin arterial elevada. La presin arterial elevada fuerza al corazn a trabajar ms para bombear la sangre. Esto puede causar problemas con el paso del [place]. Una lectura de presin arterial est compuesta por 2 nmeros. Hay un nmero superior (sistlico) sobre un nmero inferior (diastlico). Lo ideal es tener la presin arterial por debajo de 120/80. Las elecciones saludables pueden ayudar a Engineer, materials presin arterial, o tal vez necesite medicamentos para bajarla. Cules son las causas? Se desconoce la causa de esta afeccin. Algunas afecciones pueden estar relacionadas con la presin arterial alta. Qu incrementa el riesgo?  Fumar.  Tener  diabetes mellitus tipo 2, colesterol alto, o ambos.  No hacer la cantidad suficiente de actividad fsica o ejercicio.  Tener sobrepeso.  Consumir mucha grasa, azcar, caloras o sal (sodio) en su dieta.  Beber alcohol en exceso.  Tener una enfermedad renal a largo plazo (crnica).  Tener antecedentes familiares de presin arterial alta.  Edad. Los riesgos aumentan con la edad.  [place]. El riesgo es mayor para las Retail banker.  Sexo. Antes de los 45aos, los hombres corren ms [employer]. Despus de los 65aos, las mujeres corren ms [employer].  Tener apnea obstructiva del sueo.  Estrs. Cules son los signos o los sntomas?  Es posible que la presin arterial alta puede no cause sntomas. La presin arterial muy alta (crisis hipertensiva) puede provocar: ? Dolor de [place]. ? Sensaciones de preocupacin o nerviosismo (ansiedad). ? Falta de aire. ? Hemorragia nasal. ? Sensacin de Engineer, site (nuseas). ? Vmitos. ? Cambios en la forma de ver. ? Dolor muy intenso en el pecho. ? Convulsiones. Cmo se trata?  Esta afeccin se trata haciendo cambios saludables en el estilo de vida, por ejemplo: ? Consumir alimentos saludables. ? Hacer ms ejercicio. ? Beber menos alcohol.  El mdico puede recetarle medicamentos si los cambios en el estilo de vida no son suficientes para Child psychotherapist la presin arterial y si: ? El nmero  de arriba est por encima de 130. ? El nmero de abajo est por encima de 80.  Su presin arterial personal ideal puede variar. Siga estas instrucciones en su casa: Comida y bebida   Si se lo dicen, siga el plan de alimentacin de DASH (Dietary Approaches to Stop Hypertension, Maneras de alimentarse para detener la hipertensin). Para seguir este plan: ? Llene la mitad del plato de cada comida con frutas y verduras. ? Llene un cuarto del plato de cada comida con cereales integrales. Los cereales  integrales incluyen pasta integral, arroz integral y pan integral. ? Coma y beba productos lcteos con bajo contenido de grasa, como leche descremada o yogur bajo en grasas. ? Llene un cuarto del plato de cada comida con protenas bajas en grasa (magras). Las protenas bajas en grasa incluyen pescado, pollo sin piel, huevos, frijoles y tofu. ? Evite consumir carne grasa, carne curada y procesada, o pollo con piel. ? Evite consumir alimentos prehechos o procesados.  Consuma menos de 1500 mg de sal por da.  No beba alcohol si: ? El mdico le indica que no lo haga. ? Est embarazada, puede estar embarazada o est tratando de quedar embarazada.  Si bebe alcohol: ? Limite la cantidad que bebe a lo siguiente:  De 0 a 1 medida por da para las mujeres.  De 0 a 2 medidas por da para los hombres. ? Est atento a la cantidad de alcohol que hay en las bebidas que toma. En los [place], una medida equivale a una botella de cerveza de 12oz (393ml), un vaso de vino de 5oz (136ml) o un vaso de una bebida alcohlica de alta graduacin de 1oz (44ml). Estilo de vida   Trabaje con su mdico para mantenerse en un peso saludable o para perder peso. Pregntele a su mdico cul es el peso recomendable para usted.  Haga al menos 17minutos de ejercicio la [employer] de la [place]. Estos pueden incluir caminar, nadar o andar en bicicleta.  Realice al menos 30 minutos de ejercicio que fortalezca sus msculos (ejercicios de resistencia) al menos 3 das a la [place]. Estos pueden incluir levantar pesas o hacer [place].  No consuma ningn producto que contenga nicotina o tabaco, como cigarrillos, cigarrillos electrnicos y tabaco de Higher education careers adviser. Si necesita ayuda para dejar de fumar, consulte al [employer].  Controle su presin arterial en su casa tal como le indic el mdico.  Concurra a todas las visitas de seguimiento como se lo haya indicado el mdico. Esto es importante. Medicamentos  [employer] de venta libre y los recetados solamente como se lo haya indicado el mdico. Siga cuidadosamente las indicaciones.  No omita las dosis de medicamentos para la presin arterial. Los medicamentos pierden eficacia si omite dosis. El hecho de omitir las dosis tambin [place] el riesgo de otros problemas.  Pregntele a su mdico a qu efectos secundarios o reacciones a los Careers information officer. Comunquese con un mdico si:  Piensa que tiene [place] reaccin a los medicamentos que est tomando.  Tiene dolores de cabeza frecuentes (recurrentes).  Se siente mareado.  Tiene hinchazn en los tobillos.  Tiene problemas de visin. Solicite ayuda inmediatamente si:  Siente un dolor de cabeza muy intenso.  Empieza a sentirse desorientado (confundido).  Se siente dbil o adormecido.  Siente que va a desmayarse.  Tiene un dolor muy intenso en las siguientes zonas: ? Pecho. ? Vientre (abdomen).  Vomita ms de una vez.  Tiene dificultad para respirar.  Resumen  El trmino hipertensin es otra forma de denominar a la presin arterial elevada.  La presin arterial elevada fuerza al corazn a trabajar ms para bombear la sangre.  Para la [employer], una presin arterial normal es menor que 120/80.  Las decisiones saludables pueden ayudarle a disminuir su presin arterial. Si no puede bajar su presin arterial mediante decisiones saludables, es posible que deba tomar medicamentos. Esta informacin no tiene Marine scientist el consejo del mdico. Asegrese de hacerle al mdico cualquier pregunta que tenga. Document Released: 04/07/2010 Document Revised: 08/03/2018 Document Reviewed: 08/03/2018 Elsevier Patient Education  2020 [employer].

## 2019-08-13 ENCOUNTER — Ambulatory Visit: Payer: Self-pay | Attending: Family Medicine

## 2019-08-13 ENCOUNTER — Other Ambulatory Visit: Payer: Self-pay

## 2019-08-18 ENCOUNTER — Encounter (HOSPITAL_COMMUNITY): Payer: Self-pay | Admitting: Emergency Medicine

## 2019-08-18 ENCOUNTER — Other Ambulatory Visit: Payer: Self-pay

## 2019-08-18 ENCOUNTER — Emergency Department (HOSPITAL_COMMUNITY)
Admission: EM | Admit: 2019-08-18 | Discharge: 2019-08-19 | Disposition: A | Discharge status: Home / Self Care | Payer: Self-pay | Attending: Emergency Medicine | Admitting: Emergency Medicine

## 2019-08-18 DIAGNOSIS — Z5321 Procedure and treatment not carried out due to patient leaving prior to being seen by health care provider: Secondary | ICD-10-CM | POA: Insufficient documentation

## 2019-08-18 DIAGNOSIS — R109 Unspecified abdominal pain: Secondary | ICD-10-CM | POA: Insufficient documentation

## 2019-08-18 LAB — CBC
HCT: 47.7 % (ref 39.0–52.0)
Hemoglobin: 16.9 g/dL (ref 13.0–17.0)
MCH: 30.5 pg (ref 26.0–34.0)
MCHC: 35.4 g/dL (ref 30.0–36.0)
MCV: 86.1 fL (ref 80.0–100.0)
Platelets: 300 10*3/uL (ref 150–400)
RBC: 5.54 MIL/uL (ref 4.22–5.81)
RDW: 12.3 % (ref 11.5–15.5)
WBC: 8.6 10*3/uL (ref 4.0–10.5)
nRBC: 0 % (ref 0.0–0.2)

## 2019-08-18 LAB — COMPREHENSIVE METABOLIC PANEL
ALT: 70 U/L — ABNORMAL HIGH (ref 0–44)
AST: 40 U/L (ref 15–41)
Albumin: 4.5 g/dL (ref 3.5–5.0)
Alkaline Phosphatase: 79 U/L (ref 38–126)
Anion gap: 10 (ref 5–15)
BUN: 15 mg/dL (ref 6–20)
CO2: 24 mmol/L (ref 22–32)
Calcium: 9 mg/dL (ref 8.9–10.3)
Chloride: 103 mmol/L (ref 98–111)
Creatinine, Ser: 0.89 mg/dL (ref 0.61–1.24)
GFR calc Af Amer: >60 mL/min (ref >60)
GFR calc non Af Amer: >60 mL/min (ref >60)
Glucose, Bld: 154 mg/dL — ABNORMAL HIGH (ref 70–99)
Potassium: 3.5 mmol/L (ref 3.5–5.1)
Sodium: 137 mmol/L (ref 135–145)
Total Bilirubin: 0.5 mg/dL (ref 0.3–1.2)
Total Protein: 7.6 g/dL (ref 6.5–8.1)

## 2019-08-18 LAB — LIPASE, BLOOD: Lipase: 40 U/L (ref 11–51)

## 2019-08-18 LAB — URINALYSIS, ROUTINE W REFLEX MICROSCOPIC
Bilirubin Urine: NEGATIVE
Glucose, UA: 50 mg/dL — AB
Hgb urine dipstick: NEGATIVE
Ketones, ur: NEGATIVE mg/dL
Leukocytes,Ua: NEGATIVE
Nitrite: NEGATIVE
Protein, ur: NEGATIVE mg/dL
Specific Gravity, Urine: 1.014 (ref 1.005–1.030)
pH: 7 (ref 5.0–8.0)

## 2019-08-18 NOTE — ED Triage Notes (Signed)
Patient reports persistent generalized abdominal pain onset this afternoon , denies emesis or diarrhea , no fever or chills .

## 2019-08-19 NOTE — ED Notes (Signed)
Pt stated that he was leaving.  Pt says that his pain has subsided.

## 2019-09-07 MED FILL — AMLODIPINE BESYLATE 10 MG T: 10 | 30 days supply | Qty: 30 | Fill #1

## 2019-09-13 ENCOUNTER — Telehealth: Payer: Self-pay | Admitting: Family Medicine

## 2019-09-13 NOTE — Telephone Encounter (Signed)
Patient called requesting to know the status of his CAFA. Please f/u

## 2019-09-13 NOTE — Telephone Encounter (Signed)
Pt was informed that still under review the CAFA still waiting for a repond

## 2019-09-14 ENCOUNTER — Ambulatory Visit: Payer: Self-pay | Admitting: Family Medicine

## 2019-09-14 ENCOUNTER — Ambulatory Visit: Payer: Self-pay | Admitting: Internal Medicine

## 2019-10-12 ENCOUNTER — Encounter: Payer: Self-pay | Admitting: Family Medicine

## 2019-10-12 ENCOUNTER — Ambulatory Visit: Payer: Self-pay | Attending: Family Medicine | Admitting: Family Medicine

## 2019-10-12 ENCOUNTER — Other Ambulatory Visit: Payer: Self-pay

## 2019-10-12 VITALS — BP 133/79 | HR 78 | Temp 98.0°F | Ht 65.0 in | Wt 211.2 lb

## 2019-10-12 DIAGNOSIS — Z789 Other specified health status: Secondary | ICD-10-CM

## 2019-10-12 DIAGNOSIS — Z758 Other problems related to medical facilities and other health care: Secondary | ICD-10-CM

## 2019-10-12 DIAGNOSIS — R739 Hyperglycemia, unspecified: Secondary | ICD-10-CM

## 2019-10-12 DIAGNOSIS — R7303 Prediabetes: Secondary | ICD-10-CM

## 2019-10-12 DIAGNOSIS — Z603 Acculturation difficulty: Secondary | ICD-10-CM

## 2019-10-12 DIAGNOSIS — I10 Essential (primary) hypertension: Secondary | ICD-10-CM

## 2019-10-12 DIAGNOSIS — K439 Ventral hernia without obstruction or gangrene: Secondary | ICD-10-CM

## 2019-10-12 LAB — POCT GLYCOSYLATED HEMOGLOBIN (HGB A1C): HbA1c, POC (controlled diabetic range): 6 % (ref 0.0–7.0)

## 2019-10-12 MED ORDER — METFORMIN HCL 500 MG PO TABS: ORAL_TABLET | ORAL | 1 refills | Status: DC

## 2019-10-12 NOTE — Patient Instructions (Signed)
Hernia, en adultos Hernia, Adult     Una hernia ocurre cuando tejido dentro del cuerpo protruye a travs de un punto debilitado de los msculos del vientre (pared abdominal). Esto provoca una protuberancia redondeada (bulto). Esta protuberancia puede estar:  En una cicatriz de una ciruga realizada en su vientre (hernia incisional).  Cerca de la parte inferior del vientre (hernia umbilical).  En la ingle (hernia inguinal). La ingle es TEFL teacher en el que se unen las piernas con la parte inferior del vientre (abdomen). Este tipo de hernia tambin puede aparecer en: ? En el escroto, si es varn. ? En los pliegues de la piel alrededor de la vagina, si es [place].  En la parte superior del muslo (hernia crural).  Dentro del vientre (hernia de hiato). Se produce cuando el estmago se desliza por arriba del msculo entre el vientre y el trax (diafragma). Si la hernia es pequea y no causa dolor, tal vez no sea necesario Arts administrator. Si la hernia es grande y Nurse, mental health, tal vez necesite realizarse [place]. Siga estas indicaciones en su casa: Actividad  Retail banker o Tourist information centre manager (esforzar) los msculos que estn cerca de la hernia. El esfuerzo puede ocurrir cuando: ? Levanta algo pesado. ? Defeca (hace deposiciones).  No levante ningn objeto que pese ms de 10libras (4,5kg), o el lmite que su mdico le diga, hasta que le comunique que es seguro.  Para levantar objetos pesados, use la fuerza de las piernas. No use solo los msculos de la espalda para levantarlos. Instrucciones generales  Haga estas cosas si se lo indica su mdico para no tener dificultad para defecar (estreimiento): ? Beba suficiente lquido para Theatre manager el pis (orina) de color amarillo plido. ? Coma alimentos ricos en fibra. Entre ellos, frutas y verduras frescas, cereales integrales y frijoles. ? Limite los alimentos con alto contenido de grasas y azcares procesados. Estos incluyen alimentos  fritos o dulces. ? Tome medicamentos para la dificultad para defecar.  Cuando tosa, hgalo con suavidad.  Puede empujar su hernia hacia adentro presionando muy suavemente sobre esta cuando est acostado. No intente forzar el bulto hacia adentro nuevamente si este no entra fcilmente.  Si tiene sobrepeso, trabaje con el mdico para adelgazar sin riegos.  No consuma ningn producto que contenga nicotina o tabaco. Esto incluye cigarrillos y cigarrillos electrnicos. Si necesita ayuda para dejar de fumar, consulte al [employer].  Si le programan una ciruga (reparacin de una hernia), controle la hernia para detectar cualquier cambio en la forma, tamao o color. Informe al mdico si observa algn cambio.  Tome los medicamentos de venta libre y los recetados solamente como se lo haya indicado el mdico.  Consulting civil engineer a todas las visitas de control como se lo haya indicado el mdico. Comunquese con un mdico si:  Tiene un dolor nuevo, hinchazn o enrojecimiento cerca de la hernia.  Defeca menos veces por semana que lo normal.  Tiene dificultad para defecar.  La materia fecal (heces) es ms seca que lo normal.  Tiene heces que son ms duras o grandes que lo normal. Solicite ayuda de inmediato si:  Tiene fiebre.  Siente un dolor en la zona baja del vientre que empeora.  Siente malestar estomacal (nuseas).  Vomita.  No puede empujar su hernia hacia adentro al presionar muy suavemente sobre esta cuando est [place]. No intente forzar el bulto hacia adentro nuevamente si este no entra fcilmente.  La hernia: ? Cambia de forma o de tamao. ? Cambia de  color. ? Se siente dura o le duele cuando la toca. Estos sntomas pueden representar un problema grave que constituye Engineer, maintenance (IT). No espere hasta que los sntomas desaparezcan. Solicite atencin mdica de inmediato. Comunquese con el servicio de emergencias de su localidad (911 en los [place]). Resumen  Una hernia ocurre cuando  tejido dentro del cuerpo protruye a travs de un punto debilitado de los msculos del vientre. Esto crea un bulto.  Si la hernia es pequea y no causa dolor, tal vez no sea necesario Arts administrator. Si la hernia es grande y Nurse, mental health, tal vez necesite realizarse [place].  Si le programan una ciruga, controle la hernia para detectar cualquier cambio en la forma, tamao o color. Informe al mdico si hay algn cambio. Esta informacin no tiene Marine scientist el consejo del mdico. Asegrese de hacerle al mdico cualquier pregunta que tenga. Document Released: 08/08/2013 Document Revised: 01/13/2018 Document Reviewed: 10/05/2017 Elsevier Patient Education  2020 [place] de la diabetes mellitus tipo 2 Preventing Type 2 Diabetes Mellitus La diabetes tipo 2 (diabetes mellitus tipo 2) es una enfermedad a Production designer, theatre/television/film plazo (crnica) que afecta los niveles de azcar en la sangre (glucosa). Normalmente, una hormona denominada insulina permite el ingreso de la glucosa en las clulas del cuerpo. Las clulas usan la glucosa para Dealer. En la diabetes tipo 2, puede presentarse uno de los siguientes problemas, o ambos:  El cuerpo no produce la cantidad suficiente de insulina.  El cuerpo no responde de [place] a la insulina que produce (resistencia a la insulina). La resistencia a la insulina o la falta de insulina hacen que el exceso de glucosa se acumule en la sangre, en lugar de ir a las clulas. Como resultado, se produce un nivel alto de glucemia (hiperglucemia), lo cual puede causar muchas complicaciones. Al tener sobreseo o ser obeso y tener un estilo de vida inactivo (sedentario) puede aumentar su riesgo de padecer diabetes. La diabetes tipo 2 se puede retrasar o prevenir al realizar determinados cambios en la alimentacin y el estilo de vida. Qu cambios se pueden Engineer, structural?  Consuma comidas y refrigerios saludables con regularidad.  Tenga a mano un refrigerio saludable cuando sienta [employer], tal como una fruta o un puado de nueces.  Como carnes [place] y protenas con bajo contenido de grasas saturadas como pollo, pescado, claras de huevo y frijoles. Evite las carnes procesadas.  Coma muchas frutas y verduras, y muchos granos no procesados (granos enteros). Se recomienda que coma: ? 1 a 2 tazas de [employer]. ? 2 a 3 tazas de [employer]. ? 6 a 8 onzas de granos enteros todos los das, como avena, [place], trigo [place], arroz integral, quinoa y mijo.  Coma productos lcteos de bajo contenido graso, como [place], yogur y [place].  Coma alimentos que contengan grasas saludables, como frutos secos, aguacate, aceite de [place] y aceite de canola.  Beba agua [place]. Evite los lquidos que contengan azcar agregada como gaseosas o t [place].  Siga las indicaciones de su mdico respecto de las restricciones especficas para las comidas o las bebidas.  Controle la cantidad de alimentos que come por vez (tamao de la porcin). ? Verifique las etiquetas de los alimentos para verificar los tamaos de las porciones de los alimentos. ? Use una balanza de cocina para pesar las porciones de alimentos.  Saltee o hierva al vapor los Building surveyor de  frerlos. Cocine con agua o caldo en lugar de aceites o manteca.  Limite su consumo de: ? Sal (sodio). No consuma ms de 1 cucharadita (2,400mg ) de Electrical engineer. Si tiene enfermedad cardaca o presin arterial alta, debe consumir menos de  a  cucharadita (1,500mg ) de sodio por da. ? Grasas saturadas. Se trata de grasa slida a temperatura [employer] o la grasa de la carne. Qu cambios en el estilo de vida se pueden realizar?  Actividad  Haga actividad fsica de intensidad moderada durante al menos 1minutos como mnimo 5das por semana o con la frecuencia que le indique su mdico.  Pregntele al mdico qu  actividades son seguras para usted. Una combinacin de actividades puede ser la mejor opcin, por ejemplo, caminar, practicar natacin, andar en bicicleta y hacer entrenamiento de fuerza.  Trate de agregar actividad fsica a su jornada. Por ejemplo: ? Estacione en lugares ms alejados de lo habitual para tener que caminar ms. Por ejemplo, estacione en una equina alejada del estacionamiento cuando vaya a la oficina o a la tienda de comestibles. ? Vaya a caminar durante su hora de almuerzo. ? Utilice las Clinical cytogeneticist de ascensores o escaleras mecnicas. Bajar de peso  [place] de peso segn se le indique. El mdico puede determinar cuntos kilos tiene que bajar y [place] a que adelgace de Geographical information systems officer segura.  Si tiene sobrepeso o es obeso, es posible que se le indique que pierda al menos entre el 5 y el 7% de su Engineer, site. Alcohol y tabaco   Limite el consumo de alcohol a no ms de 1 medida por da si es mujer y no est Music therapist y a 2 medidas por da si es hombre. Una medida equivale a 12onzas de cerveza, 5onzas de vino o 1onzas de bebidas alcohlicas de alta graduacin.  No consuma ningn producto que contenga tabaco, lo que incluye cigarrillos, tabaco de Higher education careers adviser y Psychologist, sport and exercise. Si necesita ayuda para dejar de fumar, consulte al mdico. Trabaje con su mdico  Contrlese la glucemia de [place] regular, segn lo indicado por su mdico.  Analice sus factores de riesgo y cmo puede reducir su riesgo de padecer diabetes.  Somtase a pruebas de Programme researcher, broadcasting/film/video segn lo indicado por su mdico. Puede realizarse pruebas de deteccin con regularidad, especialmente si tiene ciertos factores de riesgo de padecer diabetes tipo 2.  Haga una cita con un especialista en dietas y nutricin (nutricionista matriculado). Un nutricionista matriculado puede ayudarlo a crear un plan de alimentacin saludable y a comprender los tamaos de las porciones y las etiquetas de los alimentos. Por qu son  importantes estos cambios?  Es posible prevenir o Nurse, children's diabetes tipo 2 y los problemas de salud relacionados al realizar cambios en el estilo de vida y Youth worker.  Puede ser difcil reconocer los signos de la diabetes tipo 2. La mejor manera de evitar posibles daos en el cuerpo es tomar medidas para prevenir la enfermedad antes que usted desarrolle los sntomas. Qu puede suceder si no se realizan cambios?  Sus niveles de glucemia pueden continuar aumentando. Tener la glucemia alta durante mucho tiempo es peligroso. Demasiada glucosa en la sangre puede daar los vasos sanguneos, el corazn, los riones, los nervios y los ojos.  Puede desarrollar prediabetes o diabetes tipo 2. La diabetes tipo 2 puede ocasionar muchos problemas de salud crnicos y complicaciones, tales como: ? Enfermedad cardaca. ? Accidente cerebrovascular. ? Ceguera. ? Enfermedad renal. ? Depresin. ? Circulacin deficiente en los pies y  las piernas, lo cual podra ocasionar una extirpacin quirrgica (amputacin) en casos graves. Dnde encontrar 35  Pida a su mdico que le recomiende un nutricionista Public relations account executive, Radio broadcast assistant en diabetes o un programa para Sports coach de [place].  Busque grupos de apoyo para bajar de peso a nivel local o en lnea.  Asista a un gimnasio, club de acondicionamiento fsico o nase a un grupo que realice actividad fsica al aire libre como un club de caminata. Dnde encontrar ms informacin Para obtener ms informacin sobre la diabetes y la prevencin de la diabetes, visite:  Asociacin Americana de la Diabetes (American Diabetes Association, ADA): www.diabetes.[place] Diabetes y las Enfermedades Digestivas y [facility] of Diabetes and Digestive and Kidney Diseases): FindSpin.nl Para obtener ms informacin sobre [employer], visite:  Departamento de Agricultura de los EE.UU., Mi plato (U.S.  Department of Agriculture, Engineer, structural, Choose My Plate): http://wiley-williams.com/  Oficina para la Prevencin de Enfermedades y Promocin de Passenger transport manager, Games developer de Designer, multimedia (Office of Disease Prevention and Health Promotion, ODPHP, Dietary Guidelines): SurferLive.at Resumen  Puede reducir su riesgo de padecer diabetes tipo 2 al aumentar su actividad fsica, comer alimentos saludables y Sports coach de peso segn se lo indiquen.  Hable con su mdico sobre su riesgo de padecer diabetes tipo 2. Pregunte [employer]  de sangre o pruebas de deteccin que debe Dispensing optician. Esta informacin no tiene Marine scientist el consejo del mdico. Asegrese de hacerle al mdico cualquier pregunta que tenga. Document Released: 12/09/2015 Document Revised: 02/07/2017 Document Reviewed: 12/09/2015 Elsevier Patient Education  2020 [employer].

## 2019-10-12 NOTE — Progress Notes (Signed)
Established Patient Office Visit  Subjective:  Patient ID: Austin Rubio, male    DOB: [birth year]  Age: 43 y.o. MRN: [id number]  CC:  Chief Complaint  Patient presents with  . Hypertension   Due to language barrier, Stratus video interpretation system used at today's visit  HPI Austin Rubio, 43 year old male who is being seen in follow-up of hypertension and patient also with elevated blood sugar on blood work done at his last visit on 08/18/2019.  Blood sugar was 154.  He also reports recent episode of increased abdominal pain which lasted for several hours after he had bent over.  He was referred for surgical evaluation regarding his ventral hernia at his last visit but he reports that he never heard back from anyone regarding surgical appointment.  He has had no further episodes of severe abdominal pain but would like to be referred again to surgery regarding repair of his hernia.  He is taking his blood pressure medication daily and denies any headaches or dizziness related to his blood pressure.  He denies any current increased thirst or urinary frequency related to his abnormal blood work with elevated blood sugar.  Past Medical History:  Diagnosis Date  . Unspecified essential hypertension 05/17/2013    Past Surgical History:  Procedure Laterality Date  . HERNIA REPAIR      Family History  Problem Relation Age of Onset  . Diabetes Mother   . Heart disease Father     Social History   Socioeconomic History  . Marital status: Married    Spouse name: Not on file  . Number of children: Not on file  . Years of education: Not on file  . Highest education level: Not on file  Occupational History  . Not on file  Tobacco Use  . Smoking status: Never Smoker  . Smokeless tobacco: Never Used  Substance and Sexual Activity  . Alcohol use: No    Comment: socially  . Drug use: No  . Sexual activity: Yes    Partners: Female    Birth control/protection: None  Other  Topics Concern  . Not on file  Social History Narrative  . Not on file   Social Determinants of Health   Financial Resource Strain:   . Difficulty of Paying Living Expenses: Not on file  Food Insecurity:   . Worried About Charity fundraiser in the Last Year: Not on file  . Ran Out of Food in the Last Year: Not on file  Transportation Needs:   . Lack of Transportation (Medical): Not on file  . Lack of Transportation (Non-Medical): Not on file  Physical Activity:   . Days of Exercise per Week: Not on file  . Minutes of Exercise per Session: Not on file  Stress:   . Feeling of Stress : Not on file  Social Connections:   . Frequency of Communication with Friends and Family: Not on file  . Frequency of Social Gatherings with Friends and Family: Not on file  . Attends Religious Services: Not on file  . Active Member of Clubs or Organizations: Not on file  . Attends Archivist Meetings: Not on file  . Marital Status: Not on file  Intimate Partner Violence:   . Fear of Current or Ex-Partner: Not on file  . Emotionally Abused: Not on file  . Physically Abused: Not on file  . Sexually Abused: Not on file    Outpatient Medications Prior to Visit  Medication Sig  Dispense Refill  . amLODipine (NORVASC) 10 MG tablet One pill once per day to lower blood pressure 90 tablet 1  . cetirizine (ZYRTEC) 10 MG tablet Take 1 tablet (10 mg total) by mouth daily. 30 tablet 11  . fluticasone (FLONASE) 50 MCG/ACT nasal spray Place 2 sprays into both nostrils daily. 16 g 6  . sildenafil (VIAGRA) 100 MG tablet 1/2-1  tab 30 mins to 1 hr prior to intercourse. Limit use to 1 tab/24 hrs. 10 tablet 2   No facility-administered medications prior to visit.    No Known Allergies  ROS Review of Systems  Constitutional: Positive for fatigue. Negative for chills and fever.  HENT: Negative for sore throat and trouble swallowing.   Eyes: Negative for photophobia and visual disturbance.    Respiratory: Negative for cough and shortness of breath.   Cardiovascular: Negative for chest pain, palpitations and leg swelling.  Gastrointestinal: Negative for abdominal pain, blood in stool, constipation, diarrhea and nausea.       No current abdominal pain but did have episode of pain related to his hernia  Endocrine: Negative for polydipsia, polyphagia and polyuria.  Musculoskeletal: Positive for back pain (Occasional). Negative for arthralgias.  Neurological: Negative for dizziness and headaches.  Hematological: Negative for adenopathy. Does not bruise/bleed easily.      Objective:    Physical Exam  Constitutional: He is oriented to person, place, and time. He appears well-developed and well-nourished.  Neck: No JVD present.  Cardiovascular: Normal rate and regular rhythm.  Pulmonary/Chest: Effort normal and breath sounds normal.  Abdominal: Soft. There is no abdominal tenderness. There is no rebound and no guarding.  Presence of ventral hernia  Musculoskeletal:        General: No tenderness or edema.     Cervical back: Normal range of motion and neck supple.  Lymphadenopathy:    He has no cervical adenopathy.  Neurological: He is alert and oriented to person, place, and time.  Skin: Skin is warm and dry. No erythema.  Psychiatric: He has a normal mood and affect. His behavior is normal.  Nursing note and vitals reviewed.   BP 133/79   Pulse 78   Temp 98 F (36.7 C) (Oral)   Ht 5\' 5"  (1.651 m)   Wt 211 lb 3.2 oz (95.8 kg)   BMI 35.15 kg/m  Wt Readings from Last 3 Encounters:  10/12/19 211 lb 3.2 oz (95.8 kg)  08/03/19 211 lb (95.7 kg)  04/13/18 204 lb 3.2 oz (92.6 kg)    Lab Results  Component Value Date   TSH 1.723 12/22/2009   Lab Results  Component Value Date   WBC 8.6 08/18/2019   HGB 16.9 08/18/2019   HCT 47.7 08/18/2019   MCV 86.1 08/18/2019   PLT 300 08/18/2019   Lab Results  Component Value Date   NA 137 08/18/2019   K 3.5 08/18/2019   CO2  24 08/18/2019   GLUCOSE 154 (H) 08/18/2019   BUN 15 08/18/2019   CREATININE 0.89 08/18/2019   BILITOT 0.5 08/18/2019   ALKPHOS 79 08/18/2019   AST 40 08/18/2019   ALT 70 (H) 08/18/2019   PROT 7.6 08/18/2019   ALBUMIN 4.5 08/18/2019   CALCIUM 9.0 08/18/2019   ANIONGAP 10 08/18/2019   Lab Results  Component Value Date   CHOL 189 03/22/2017   Lab Results  Component Value Date   HDL 41 03/22/2017   Lab Results  Component Value Date   LDLCALC 128 (H) 03/22/2017  Lab Results  Component Value Date   TRIG 99 03/22/2017   Lab Results  Component Value Date   CHOLHDL 4.6 03/22/2017   Lab Results  Component Value Date   HGBA1C 5.5% 05/17/2013      Assessment & Plan:  1. Essential hypertension Blood pressure is well controlled on his current amlodipine which patient is to continue along with a low-sodium diet and exercise as tolerated.  2. Elevated blood sugar level Patient with elevated blood sugar level of 154 on blood work done at his October visit patient will have hemoglobin A1c at today's visit to see if he may be prediabetic or diabetic. - HgB A1c  3. Ventral hernia without obstruction or gangrene Patient reports that he has had recent episode of increased pain in his mid abdomen at the site of an abdominal hernia.  He reports past surgical repair of a right inguinal hernia.  New referral being placed to surgery for evaluation and treatment of ventral hernia.  He has been advised to avoid heavy lifting and to seek follow-up at the emergency department if he he has another episode of severe pain. - Ambulatory referral to General Surgery  4. Prediabetes Patient's hemoglobin A1c at today's visit was elevated to 6.0 consistent with prediabetes.  Patient agrees to start Metformin, 2 pills once daily after his evening meal to help with insulin resistance and control of blood sugars.  Educational material in Spanish given to the patient regarding prevention of type 2 diabetes.   Discussed low carbohydrate/no concentrated sweets diet. - metFORMIN (GLUCOPHAGE) 500 MG tablet; Take 2 pills after the evening meal (Patient taking differently: Take 1,000 mg by mouth daily with supper. )  Dispense: 180 tablet; Refill: 1  5.  Language barrier Stratus video interpretation system used at today's visit to help with language barrier  An After Visit Summary was printed and given to the patient.   Follow-up: Return in about 4 months (around 02/10/2020) for HTN/chronic issues; sooner if needed.   Antony Blackbird, MD

## 2019-10-15 MED FILL — AMLODIPINE BESYLATE 10 MG T: 10 | 30 days supply | Qty: 30 | Fill #2

## 2019-10-15 MED FILL — metFORMIN HCL 500 MG TABS: 500 | 60 days supply | Qty: 60 | Fill #0

## 2019-11-15 ENCOUNTER — Telehealth: Payer: Self-pay | Admitting: Family Medicine

## 2019-11-15 NOTE — Telephone Encounter (Signed)
Pt needs his CAFA letter to schedule his General Surgery. please follow up as application was submitted 08/28/2019 and his referral was put on hold because of this.

## 2019-11-15 NOTE — Telephone Encounter (Signed)
Pt was called back about his request, he was informed that [facility] financial review his application and  He's going to need to provide 90 days of statements or transaction history for this account, Pt was call an informed of the documents we need and the time he has to bring it

## 2019-12-12 MED FILL — metFORMIN HCL 500 MG TABS: 500 | 60 days supply | Qty: 60 | Fill #1

## 2019-12-12 MED FILL — AMLODIPINE BESYLATE 10 MG T: 10 | 30 days supply | Qty: 30 | Fill #3

## 2019-12-13 ENCOUNTER — Telehealth: Payer: Self-pay | Admitting: Family Medicine

## 2019-12-13 NOTE — Telephone Encounter (Signed)
Noted   Sent referral to [facility] ([place])   .They will contact patient to schedule an appointment .

## 2019-12-13 NOTE — Telephone Encounter (Signed)
Patients wife came in to inform referral coordinator that pt now has CAFA and would like his referral for general surgery to be scheduled. Please follow up.

## 2019-12-24 ENCOUNTER — Ambulatory Visit (INDEPENDENT_AMBULATORY_CARE_PROVIDER_SITE_OTHER): Payer: Self-pay | Admitting: Surgery

## 2019-12-24 ENCOUNTER — Other Ambulatory Visit: Payer: Self-pay

## 2019-12-24 ENCOUNTER — Encounter: Payer: Self-pay | Admitting: Surgery

## 2019-12-24 VITALS — BP 170/99 | HR 78 | Temp 97.5°F | Resp 12 | Ht 65.0 in | Wt 204.8 lb

## 2019-12-24 DIAGNOSIS — K439 Ventral hernia without obstruction or gangrene: Secondary | ICD-10-CM

## 2019-12-24 NOTE — Patient Instructions (Addendum)
Our surgery scheduler will call you within 24-48 hours to schedule your surgery. Please have the [place] surgery sheet available when speaking with her.    Reparacin laparoscpica de una hernia ventral, cuidados posteriores Laparoscopic Ventral Hernia Repair, Care After Lea esta informacin sobre cmo cuidarse despus del procedimiento. Su mdico tambin podr darle indicaciones ms especficas. Comunquese con su mdico si tiene problemas o preguntas. Qu puedo esperar despus del procedimiento? Despus del procedimiento, es comn [employer] siguientes sntomas:  Dolor, molestias o sensibilidad. Siga estas indicaciones en su casa: Cuidados de la incisin   Siga las indicaciones del mdico acerca del cuidado de la incisin. Haga lo siguiente: ? Lvese las manos con agua y jabn antes de Quarry manager las vendas (vendajes) o antes de tocarse el abdomen. Use desinfectante para manos si no dispone de [place]. ? Cambie los vendajes como se lo haya indicado el mdico. ? No retire los puntos (suturas), la goma para cerrar la piel o las tiras [place]. Es posible que estos cierres cutneos deban quedar puestos en la piel durante 2semanas o ms tiempo. Si los bordes de las tiras adhesivas empiezan a despegarse y Therapist, sports, puede recortar los que estn sueltos. No retire las tiras [facility] por completo a menos que el mdico se lo indique.  [place] zona de la incisin para detectar signos de infeccin. Est atento a los siguientes signos: ? Dolor, hinchazn o enrojecimiento. ? Lquido o sangre. ? Calor. ? Pus o mal olor. Baarse   No tome baos de inmersin, no nade ni use el jacuzzi hasta que el mdico lo autorice. Pregntele al mdico si puede ducharse. Thurston Pounds solo le permitan tomar baos de [place].  Mantenga la venda (vendaje) seca hasta que el mdico le diga que se la puede quitar. Actividad  No levante ningn objeto que pese ms de 10libras (4,5kg) hasta que el  mdico lo autorice.  No conduzca ni use maquinaria pesada mientras toma analgsicos recetados. Pregntele al mdico cundo es seguro volver a Forensic psychologist o usar maquinaria pesada.  No conduzca durante 24horas si recibi un medicamento para ayudarlo a relajarse (sedante) durante el procedimiento.  Haga reposo como se lo haya indicado el mdico. Pregntele al mdico cundo puede retomar sus actividades normales. Instrucciones generales  [employer] de venta libre y los recetados solamente como se lo haya indicado el mdico.  A fin de prevenir o tratar el estreimiento mientras toma analgsicos recetados, el mdico puede recomendarle lo siguiente: ? Tome medicamentos recetados o de venta libre. ? Consumir alimentos ricos en fibra, como frutas y verduras frescas, cereales integrales y frijoles. ? Limite el consumo de alimentos ricos en grasa y azcares procesados, como alimentos fritos o dulces.  Beba suficiente lquido para Consulting civil engineer orina clara o de color amarillo plido.  Sostenga una [employer] abdomen cuando tosa o estornude. Esto ayuda a Best boy.  Concurra a todas las visitas de control como se lo haya indicado el mdico. Esto es importante. Comunquese con un mdico si:  Tiene los siguientes sntomas: ? Cristy Hilts o escalofros. ? Enrojecimiento, hinchazn o dolor alrededor de la incisin. ? Lquido o [employer] de la incisin. ? Pus o mal olor en TEFL teacher de la incisin. ? Un dolor que empeora o que no mejora con los medicamentos. ? Nuseas o vmitos. ? Tos. ? Falta de aire.  La incisin est caliente al tacto.  No ha defecado durante tresdas.  No puede orinar. Solicite  ayuda de inmediato si:  Siente un dolor intenso en el abdomen.  Tiene nuseas o vmitos persistentes.  Tiene enrojecimiento, calor o dolor en la pierna.  Siente dolor en el pecho.  Tiene dificultad para respirar. Resumen  Despus de este procedimiento, es comn tener  dolor, [place] o sensibilidad.  Siga las indicaciones del mdico acerca del cuidado de la incisin.  [place] zona de la incisin para detectar signos de infeccin. Avsele al mdico sobre cualquier signo de infeccin.  Concurra a todas las visitas de control como se lo haya indicado el mdico. Esto es importante. Esta informacin no tiene Marine scientist el consejo del mdico. Asegrese de hacerle al mdico cualquier pregunta que tenga. Document Revised: 09/07/2017 Document Reviewed: 08/09/2012 Elsevier Patient Education  [place].     Ventral Hernia  A ventral hernia is a bulge of tissue from inside the abdomen that pushes through a weak area of the muscles that form the front wall of the abdomen. The tissues inside the abdomen are inside a sac (peritoneum). These tissues include the small intestine, large intestine, and the fatty tissue that covers the intestines (omentum). Sometimes, the bulge that forms a hernia contains intestines. Other hernias contain only fat. Ventral hernias do not go away without surgical treatment. There are several types of ventral hernias. You may have:  A hernia at an incision site from previous abdominal surgery (incisional hernia).  A hernia just above the belly button (epigastric hernia), or at the belly button (umbilical hernia). These types of hernias can develop from heavy lifting or straining.  A hernia that comes and goes (reducible hernia). It may be visible only when you lift or strain. This type of hernia can be pushed back into the abdomen (reduced).  A hernia that traps abdominal tissue inside the hernia (incarcerated hernia). This type of hernia does not reduce.  A hernia that cuts off blood flow to the tissues inside the hernia (strangulated hernia). The tissues can start to die if this happens. This is a very painful bulge that cannot be reduced. A strangulated hernia is a medical emergency. What are the  causes? This condition is caused by abdominal tissue putting pressure on an area of weakness in the abdominal muscles. What increases the risk? The following factors may make you more likely to develop this condition:  Being male.  Being 16 or older.  Being overweight or obese.  Having had previous abdominal surgery, especially if there was an infection after surgery.  Having had an injury to the abdominal wall.  Having had several pregnancies.  Having a buildup of fluid inside the abdomen (ascites). What are the signs or symptoms? The only symptom of a ventral hernia may be a painless bulge in the abdomen. A reducible hernia may be visible only when you strain, cough, or lift. Other symptoms may include:  Dull pain.  A feeling of pressure. Signs and symptoms of a strangulated hernia may include:  Increasing pain.  Nausea and vomiting.  Pain when pressing on the hernia.  The skin over the hernia turning red or purple.  Constipation.  Blood in the stool (feces). How is this diagnosed? This condition may be diagnosed based on:  Your symptoms.  Your medical history.  A physical exam. You may be asked to cough or strain while standing. These actions increase the pressure inside your abdomen and force the hernia through the opening in your muscles. Your health care provider may try  to reduce the hernia by pressing on it.  Imaging studies, such as an ultrasound or CT scan. How is this treated? This condition is treated with surgery. If you have a strangulated hernia, surgery is done as soon as possible. If your hernia is small and not incarcerated, you may be asked to lose some weight before surgery. Follow these instructions at home:  Follow instructions from your health care provider about eating or drinking restrictions.  If you are overweight, your health care provider may recommend that you increase your activity level and eat a healthier diet.  Do not lift  anything that is heavier than 10 lb (4.5 kg).  Return to your normal activities as told by your health care provider. Ask your health care provider what activities are safe for you. You may need to avoid activities that increase pressure on your hernia.  Take over-the-counter and prescription medicines only as told by your health care provider.  Keep all follow-up visits as told by your health care provider. This is important. Contact a health care provider if:  Your hernia gets larger.  Your hernia becomes painful. Get help right away if:  Your hernia becomes increasingly painful.  You have pain along with any of the following: ? Changes in skin color in the area of the hernia. ? Nausea. ? Vomiting. ? Fever. Summary  A ventral hernia is a bulge of tissue from inside the abdomen that pushes through a weak area of the muscles that form the front wall of the abdomen.  This condition is treated with surgery, which may be urgent depending on your hernia.  Do not lift anything that is heavier than 10 lb (4.5 kg), and follow activity instructions from your health care provider. This information is not intended to replace advice given to you by your health care provider. Make sure you discuss any questions you have with your health care provider. Document Revised: 11/30/2017 Document Reviewed: 05/09/2017 Elsevier Patient Education  [place].

## 2019-12-24 NOTE — Progress Notes (Signed)
Patient ID: Austin Rubio, male   DOB: [birth year], 44 y.o.   MRN: [id number]  HPI Kennyth Pearson is a 44 y.o. male seen in consultation at the request of Dr. Chapman Fitch for a ventral hernia.  He reports that he has had a bulge for the last few months on he does endorse some intermittent pain whenever he does exercise or does any Valsalva maneuver.  The pain is intermittent sharp in nature and moderate in intensity.  Of note he did have open inguinal hernia repair several years ago.  He works in [employer].  He is able to perform more than 4 METS of activity without any shortness of breath or chest pain.  There is no imaging studies.  I have review his CBC and CMP that were normal and a hemoglobin 1 AC of 6. He is hypertensive but apparently he did not take his blood pressure medicine today.  HPI  Past Medical History:  Diagnosis Date  . Unspecified essential hypertension 05/17/2013    Past Surgical History:  Procedure Laterality Date  . HERNIA REPAIR      Family History  Problem Relation Age of Onset  . Diabetes Mother   . Heart disease Father     Social History Social History   Tobacco Use  . Smoking status: Never Smoker  . Smokeless tobacco: Never Used  Substance Use Topics  . Alcohol use: No    Comment: socially  . Drug use: No    No Known Allergies  Current Outpatient Medications  Medication Sig Dispense Refill  . amLODipine (NORVASC) 10 MG tablet One pill once per day to lower blood pressure 90 tablet 1  . metFORMIN (GLUCOPHAGE) 500 MG tablet Take 2 pills after the evening meal 180 tablet 1   No current facility-administered medications for this visit.     Review of Systems Full ROS  was asked and was negative except for the information on the HPI  Physical Exam Blood pressure (!) 170/99, pulse 78, temperature (!) 97.5 F (36.4 C), temperature source Temporal, resp. rate 12, height 5\' 5"  (1.651 m), weight 204 lb 12.8 oz (92.9 kg), SpO2 97  %. CONSTITUTIONAL: NAD EYES: Pupils are equal, round, and reactive to light, Sclera are non-icteric. EARS, NOSE, MOUTH AND THROAT: Wearing a mask. Hearing is intact to voice. LYMPH NODES:  Lymph nodes in the neck are normal. RESPIRATORY:  Lungs are clear. There is normal respiratory effort, with equal breath sounds bilaterally, and without pathologic use of accessory muscles. CARDIOVASCULAR: Heart is regular without murmurs, gallops, or rubs. GI: The abdomen is  soft, nontender, and nondistended. Reducible ventral hernia on epigastrium measures aprx 3 cms. There are no palpable masses. There is no hepatosplenomegaly. There are normal bowel sounds in all quadrants. GU: Rectal deferred.   MUSCULOSKELETAL: Normal muscle strength and tone. No cyanosis or edema.   SKIN: Turgor is good and there are no pathologic skin lesions or ulcers. NEUROLOGIC: Motor and sensation is grossly normal. Cranial nerves are grossly intact. PSYCH:  Oriented to person, place and time. Affect is normal.  Data Reviewed  I have personally reviewed the patient's imaging, laboratory findings and medical records.    Assessment/Plan 44 year old male with symptomatic ventral hernia.  I do think that he is a good candidate for robotic approach.  He is hypertensive and I discussed with him the importance of compliance.  Right there is no contraindication to repair.  Procedure discussed with the patient in detail.  Risk,  benefits and possible complications including but not limited to: Bleeding, infection, injury to adjacent structures recurrence and pain.  He understands and wishes to proceed.  Caroleen Hamman, MD FACS General Surgeon 12/24/2019, 3:12 PM

## 2019-12-27 ENCOUNTER — Telehealth: Payer: Self-pay | Admitting: Surgery

## 2019-12-27 NOTE — Telephone Encounter (Signed)
Outbound spanish speaking call made by Cleta Alberts, CMA to provide the pt's spouse, Brandt Loosen, the following surgery info:  Surgery Date: 01/08/20 Preadmission Testing Date: 01/02/20 (8a-1p) Covid Testing Date: 01/04/20 - patient advised to go to the [facility] ([place])  Patient has been made aware to call [phone], between 1-3:00pm the day before surgery, to find out what time to arrive.

## 2020-01-02 ENCOUNTER — Encounter
Admission: RE | Admit: 2020-01-02 | Discharge: 2020-01-02 | Disposition: A | Discharge status: Home / Self Care | Payer: Self-pay | Source: Ambulatory Visit | Attending: Surgery | Admitting: Surgery

## 2020-01-02 HISTORY — DX: Prediabetes: R73.03

## 2020-01-02 NOTE — Patient Instructions (Signed)
Your procedure is scheduled on: Tuesday 01/08/20 Su procedimiento est programado para: martes el 9 de marzo Report to [facility] 2nd floor [place]: [facility] Candise Che To find out your arrival time please call [phone] between 1PM - 3PM on Monday 01/07/20. Para saber su hora de llegada por favor llame al [phone] entre la 1PM - 3PM el da: lunes el 8 de [place]   Remember: Instructions that are not followed completely may result in serious medical risk, up to and including death,  or upon the discretion of your surgeon and anesthesiologist your surgery may need to be rescheduled.  Recuerde: Las instrucciones que no se siguen completamente Heritage manager en un riesgo de salud grave, incluyendo hasta  la [place] o a discrecin de su cirujano y Environmental health practitioner, su ciruga se puede posponer.  _X_ 1. Do not eat food after midnight the night before your procedure. No gum chewing or hard candies. You may drink water up to 2 hours before you are scheduled to arrive for your surgery- DO not drink anything within 2 hours of the start of your surgery.    No coma nada despus de la medianoche de la noche anterior a su procedimiento. No coma chicles ni caramelos duros. Puede tomar agua hasta 2 horas antes de su hora programada de llegada al hospital para su procedimiento. No tome nada durante el transcurso de las 2 horas de su llegada programada al hospital para su procedimiento, ya que esto puede llevar a que su procedimiento se retrase o tenga que volver a Health and safety inspector.   _X_ 2.  Do Not Smoke or use e-cigarettes For 24 Hours Prior to Your Surgery.  Do not use any chewable tobacco products for at least 6 hours prior to surgery.   No fume ni use cigarrillos electrnicos durante las 24 horas previas a su [place].  No use ningn producto de tabaco masticable durante al menos 6 horas antes de la [place].    _X_ 3. No alcohol for 24 hours before or after  surgery.   No tome alcohol durante las 24 horas antes ni despus de la [place].  _X_ 4. Notify your doctor if there is any change in your medical condition (cold,fever, infections).   Informe a su mdico si hay algn cambio en su condicin mdica (resfriado, fiebre, infecciones).   Do not wear jewelry, lotions, powders, or perfumes.  No use joyas, lociones, polvos o perfumes.   Do not shave 48 hours prior to surgery. Men may shave face and neck.  No se afeite 48 horas antes de la [place].  Los hombres pueden [employer] cara y el cuello.   Do not bring valuables to the hospital.   No lleve objetos [place] is not responsible for any belongings or valuables.  [place] no se hace responsable de ningn tipo de pertenencias u objetos de Geographical information systems officer.               Contacts, dentures or bridgework may not be worn into surgery.  Los lentes de [place], las dentaduras postizas o puentes no se pueden usar en la [place].   For patients admitted to the hospital, discharge time is determined by your  treatment team.  Para los pacientes que sean ingresados al hospital, el tiempo en el cual se le  dar de alta es determinado por su equipo de [place].   Patients discharged the day of surgery will not be  allowed to drive home. A los pacientes que se les da de alta el mismo da de la ciruga no se les permitir conducir a Holiday representative.   Please read over the following fact sheets that you were given: Preparing For Surgery Por favor [place] hojas de informacin que le dieron: Preparandose para la [place]   _X_ Take these medicines the morning of surgery with A SIP OF WATER:          [employer] estas medicinas la maana de la ciruga con UN SORBO DE AGUA:  1. Amlodipine (Norvasc)  _X_ Use CHG Soap as directed          Utilice el jabn de CHG segn lo indicado  _X_ Stop metformin 2 days prior to surgery (Saturday 01/05/20)          Deje de tomar el metformin 2 das antes de  la ciruga (sabado el 6 de [place])   ___ Stop Anti-inflammatories (Aspirin, Ibuprofen, Motrin, Advil, Naproxen, Aleve) today           Deje de tomar antiinflamatorios hoy   ____ Stop supplements until after surgery            Deje de tomar suplementos hasta despus de la [place]

## 2020-01-04 ENCOUNTER — Other Ambulatory Visit: Payer: Self-pay

## 2020-01-04 ENCOUNTER — Encounter
Admission: RE | Admit: 2020-01-04 | Discharge: 2020-01-04 | Disposition: A | Discharge status: Home / Self Care | Payer: Self-pay | Source: Ambulatory Visit | Attending: Surgery | Admitting: Surgery

## 2020-01-04 ENCOUNTER — Encounter (HOSPITAL_COMMUNITY): Payer: Self-pay | Admitting: Anesthesiology

## 2020-01-04 DIAGNOSIS — I10 Essential (primary) hypertension: Secondary | ICD-10-CM | POA: Insufficient documentation

## 2020-01-04 DIAGNOSIS — Z01818 Encounter for other preprocedural examination: Secondary | ICD-10-CM | POA: Insufficient documentation

## 2020-01-04 DIAGNOSIS — Z20822 Contact with and (suspected) exposure to covid-19: Secondary | ICD-10-CM | POA: Insufficient documentation

## 2020-01-04 LAB — BASIC METABOLIC PANEL
Anion gap: 7 (ref 5–15)
BUN: 15 mg/dL (ref 6–20)
CO2: 26 mmol/L (ref 22–32)
Calcium: 8.8 mg/dL — ABNORMAL LOW (ref 8.9–10.3)
Chloride: 103 mmol/L (ref 98–111)
Creatinine, Ser: 0.83 mg/dL (ref 0.61–1.24)
GFR calc Af Amer: >60 mL/min (ref >60)
GFR calc non Af Amer: >60 mL/min (ref >60)
Glucose, Bld: 108 mg/dL — ABNORMAL HIGH (ref 70–99)
Potassium: 3.7 mmol/L (ref 3.5–5.1)
Sodium: 136 mmol/L (ref 135–145)

## 2020-01-05 LAB — SARS CORONAVIRUS 2 (TAT 6-24 HRS): SARS Coronavirus 2: NEGATIVE

## 2020-01-07 ENCOUNTER — Telehealth: Payer: Self-pay

## 2020-01-07 ENCOUNTER — Other Ambulatory Visit: Payer: Self-pay | Admitting: Family Medicine

## 2020-01-07 DIAGNOSIS — R9431 Abnormal electrocardiogram [ECG] [EKG]: Secondary | ICD-10-CM

## 2020-01-07 DIAGNOSIS — Z01818 Encounter for other preprocedural examination: Secondary | ICD-10-CM

## 2020-01-07 NOTE — Telephone Encounter (Signed)
Request received from pre admit testing for medical clearance due to abnormal EKG on Friday. The patient is scheduled for surgery tomorrow, 01/08/20 with Dr Dahlia Byes. I faxed over the request to the patient's primary care provider, Dr Antony Blackbird, to see if they could clear the patient for surgery tomorrow.  The provider states that due to the patient's family history and abnormal EKG that she wants the patient to be seen and cleared by Cardiology first before he has surgery. They have sent in a referral for the patient to be seen by Cardiology.   I called and spoke with the patient's spouse and informed her of such and that we would need to post pone the patient's surgery until he is seen and cleared by Cardiology.  Lyndal Pulley surgery scheduler has been notified.

## 2020-01-07 NOTE — Progress Notes (Signed)
Patient ID: Austin Rubio, male   DOB: 11-09-1975, 44 y.o.   MRN: [id number]   Call received from [facility] surgical [place], [phone], that patient is scheduled for surgery tomorrow but his preoperative clearance on 01/04/2020 which was not received by the surgical office until today, showed EKG with abnormality of possible prior infarct.  On review of patient's chart, he does have family history significant for father with heart disease.  Patient will need cardiac preoperative clearance and stat referral is being placed to cardiology for preoperative clearance.

## 2020-01-08 ENCOUNTER — Ambulatory Visit: Admission: RE | Admit: 2020-01-08 | Payer: Self-pay | Source: Home / Self Care | Admitting: Surgery

## 2020-01-08 ENCOUNTER — Encounter: Admission: RE | Payer: Self-pay | Source: Home / Self Care

## 2020-01-08 SURGERY — REPAIR, HERNIA, VENTRAL, ROBOT-ASSISTED
Anesthesia: General

## 2020-01-10 ENCOUNTER — Encounter: Payer: Self-pay | Admitting: Cardiology

## 2020-01-10 ENCOUNTER — Ambulatory Visit (INDEPENDENT_AMBULATORY_CARE_PROVIDER_SITE_OTHER): Payer: Self-pay | Admitting: Cardiology

## 2020-01-10 ENCOUNTER — Other Ambulatory Visit: Payer: Self-pay

## 2020-01-10 VITALS — BP 124/62 | HR 66 | Temp 98.1°F | Ht 65.0 in | Wt 201.8 lb

## 2020-01-10 DIAGNOSIS — R55 Syncope and collapse: Secondary | ICD-10-CM

## 2020-01-10 DIAGNOSIS — Z01818 Encounter for other preprocedural examination: Secondary | ICD-10-CM

## 2020-01-10 NOTE — Patient Instructions (Signed)
Medication Instructions:  Your physician recommends that you continue on your current medications as directed. Please refer to the Current Medication list given to you today.  Lab Work: NONE  Testing/Procedures: Your physician has requested that you have an echocardiogram. Echocardiography is a painless test that uses sound waves to create images of your heart. It provides your doctor with information about the size and shape of your heart and how well your heart's chambers and valves are working. This procedure takes approximately one hour. There are no restrictions for this procedure. This will be done at our [facility] location:  [place]: At [employer], you and your health needs are our priority.  As part of our continuing mission to provide you with exceptional heart care, we have created designated Provider Care Teams.  These Care Teams include your primary Cardiologist (physician) and Advanced Practice Providers (APPs -  Physician Assistants and Nurse Practitioners) who all work together to provide you with the care you need, when you need it.  We recommend signing up for the patient portal called "MyChart".  Sign up information is provided on this After Visit Summary.  MyChart is used to connect with patients for Virtual Visits (Telemedicine).  Patients are able to view lab/test results, encounter notes, upcoming appointments, etc.  Non-urgent messages can be sent to your provider as well.   To learn more about what you can do with MyChart, go to [email or web address].    Your next appointment:   3 month(s)  The format for your next appointment:   Either In Person or Virtual  Provider:   Oswaldo Milian, MD

## 2020-01-10 NOTE — Progress Notes (Signed)
Cardiology Office Note:    Date:  01/10/2020   ID:  Austin Rubio, DOB 1976-04-09, MRN [id number]  PCP:  Antony Blackbird, MD  Cardiologist:  No primary care provider on file.  Electrophysiologist:  None   Referring MD: Antony Blackbird, MD   Chief Complaint  Patient presents with  . Pre-op Exam   History of Present Illness:    Austin Rubio is a 44 y.o. male with a hx of prediabetes, hypertension who is referred by Dr. Chapman Fitch for preop evaluation prior to hernia repair.  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  He denies any exertional chest pain or dyspnea.  Reports that he can walk up 2 flights of stairs without stopping and without symptoms.  Does report a history of syncope after hernia surgery in 2005.  States that he passed out shortly after getting home from the procedure.  He does not remember the details.  No known history of heart disease.  Reports BP has been under good control on amlodipine.  No smoking history.  Reports father died of MI at age 68.   Past Medical History:  Diagnosis Date  . Pre-diabetes   . Unspecified essential hypertension 05/17/2013    Past Surgical History:  Procedure Laterality Date  . HERNIA REPAIR      Current Medications: Current Meds  Medication Sig  . amLODipine (NORVASC) 10 MG tablet One pill once per day to lower blood pressure  . metFORMIN (GLUCOPHAGE) 500 MG tablet Take 2 pills after the evening meal     Allergies:   Patient has no known allergies.   Social History   Socioeconomic History  . Marital status: Married    Spouse name: Not on file  . Number of children: Not on file  . Years of education: Not on file  . Highest education level: Not on file  Occupational History  . Not on file  Tobacco Use  . Smoking status: Never Smoker  . Smokeless tobacco: Never Used  Substance and Sexual Activity  . Alcohol use: No    Comment: socially  . Drug use: No    . Sexual activity: Yes    Partners: Female    Birth control/protection: None  Other Topics Concern  . Not on file  Social History Narrative  . Not on file   Social Determinants of Health   Financial Resource Strain:   . Difficulty of Paying Living Expenses:   Food Insecurity:   . Worried About Charity fundraiser in the Last Year:   . Arboriculturist in the Last Year:   Transportation Needs:   . Film/video editor (Medical):   Marland Kitchen Lack of Transportation (Non-Medical):   Physical Activity:   . Days of Exercise per Week:   . Minutes of Exercise per Session:   Stress:   . Feeling of Stress :   Social Connections:   . Frequency of Communication with Friends and Family:   . Frequency of Social Gatherings with Friends and Family:   . Attends Religious Services:   . Active Member of Clubs or Organizations:   . Attends Archivist Meetings:   Marland Kitchen Marital Status:      Family History: The patient's family history includes Diabetes in his mother; Heart disease in his father.  ROS:   Please see the history of present illness.     All other systems reviewed and  are negative.  EKGs/Labs/Other Studies Reviewed:    The following studies were reviewed today:   EKG:  EKG is  ordered today.  The ekg ordered today demonstrates normal sinus rhythm, rate 66, no ST/T abnormalities  Recent Labs: 08/18/2019: ALT 70; Hemoglobin 16.9; Platelets 300 01/04/2020: BUN 15; Creatinine, Ser 0.83; Potassium 3.7; Sodium 136  Recent Lipid Panel    Component Value Date/Time   CHOL 189 03/22/2017 0840   TRIG 99 03/22/2017 0840   HDL 41 03/22/2017 0840   CHOLHDL 4.6 03/22/2017 0840   CHOLHDL 4.4 11/12/2013 1805   VLDL 36 11/12/2013 1805   LDLCALC 128 (H) 03/22/2017 0840    Physical Exam:    VS:  BP 124/62   Pulse 66   Temp 98.1 F (36.7 C)   Ht 5\' 5"  (1.651 m)   Wt 201 lb 12.8 oz (91.5 kg)   SpO2 99%   BMI 33.58 kg/m     Wt Readings from Last 3 Encounters:  01/10/20 201 lb  12.8 oz (91.5 kg)  01/02/20 204 lb 12.8 oz (92.9 kg)  12/24/19 204 lb 12.8 oz (92.9 kg)     GEN:  Well nourished, well developed in no acute distress HEENT: Normal NECK: No JVD; No carotid bruits LYMPHATICS: No lymphadenopathy CARDIAC: RRR, no murmurs, rubs, gallops RESPIRATORY:  Clear to auscultation without rales, wheezing or rhonchi  ABDOMEN: Soft, non-tender, non-distended MUSCULOSKELETAL:  No edema; No deformity  SKIN: Warm and dry NEUROLOGIC:  Alert and oriented x 3 PSYCHIATRIC:  Normal affect   ASSESSMENT:    1. Pre-op evaluation   2. Syncope, unspecified syncope type    PLAN:    Preop evaluation: No symptoms to suggest active cardiac condition.  Good functional capacity, greater than 4 METS.  RCRI score 0.  Overall would classify as low risk of major adverse cardiac event.  Suspect his prior syncopal episode after hernia surgery was not related to anesthesia, but will check TTE to ensure no structural heart disease prior to his procedure.  If TTE unremarkable, no further cardiac work-up recommended prior to procedure  Syncope: Occurred after last syncopal episode in 2005.  Suspect related to anesthesia or dehydration from being n.p.o. for his procedure.  Will check TTE to rule out structural heart disease.  RTC in 3 months   Medication Adjustments/Labs and Tests Ordered: Current medicines are reviewed at length with the patient today.  Concerns regarding medicines are outlined above.  Orders Placed This Encounter  Procedures  . EKG 12-Lead  . ECHOCARDIOGRAM COMPLETE   No orders of the defined types were placed in this encounter.   Patient Instructions  Medication Instructions:  Your physician recommends that you continue on your current medications as directed. Please refer to the Current Medication list given to you today.  Lab Work: NONE  Testing/Procedures: Your physician has requested that you have an echocardiogram. Echocardiography is a painless test  that uses sound waves to create images of your heart. It provides your doctor with information about the size and shape of your heart and how well your heart's chambers and valves are working. This procedure takes approximately one hour. There are no restrictions for this procedure. This will be done at our [facility] location:  [place]: At [facility], you and your health needs are our priority.  As part of our continuing mission to provide you with exceptional heart care, we have created designated Provider Care Teams.  These Care Teams  include your primary Cardiologist (physician) and Advanced Practice Providers (APPs -  Physician Assistants and Nurse Practitioners) who all work together to provide you with the care you need, when you need it.  We recommend signing up for the patient portal called "MyChart".  Sign up information is provided on this After Visit Summary.  MyChart is used to connect with patients for Virtual Visits (Telemedicine).  Patients are able to view lab/test results, encounter notes, upcoming appointments, etc.  Non-urgent messages can be sent to your provider as well.   To learn more about what you can do with MyChart, go to [email or web address].    Your next appointment:   3 month(s)  The format for your next appointment:   Either In Person or Virtual  Provider:   Oswaldo Milian, MD      Signed, Donato Heinz, MD  01/10/2020 5:33 PM    [place]

## 2020-01-25 MED FILL — AMLODIPINE BESYLATE 10 MG T: 10 | 30 days supply | Qty: 30 | Fill #4

## 2020-01-25 MED FILL — ?METFORMIN HCL 500MG TABLET: 500 | 30 days supply | Qty: 60 | Fill #2

## 2020-01-28 ENCOUNTER — Ambulatory Visit (HOSPITAL_COMMUNITY): Payer: Self-pay | Attending: Cardiology

## 2020-01-28 ENCOUNTER — Other Ambulatory Visit: Payer: Self-pay

## 2020-01-28 DIAGNOSIS — R55 Syncope and collapse: Secondary | ICD-10-CM | POA: Insufficient documentation

## 2020-01-30 NOTE — Progress Notes (Signed)
Patient has been cleared by Cardiology, Dr Gardiner Rhyme at [facility] care at [place]. His Echocardiogram was negative. Notes are in Epic.

## 2020-02-07 ENCOUNTER — Telehealth: Payer: Self-pay | Admitting: Surgery

## 2020-02-07 NOTE — Telephone Encounter (Signed)
Outgoing call is made from [place], she was able to speak with patient's wife, they are now informed of all dates regarding his surgery.

## 2020-02-07 NOTE — Telephone Encounter (Signed)
Outbound call, left message, please inform patient of the following:    Surgery Date: 02/26/20 Preadmission Testing Date: 02/18/20 (phone8a-1p) Covid Testing Date: 02/22/20 - patient advised to go to the [place] ([place]) between 8a-1p   Also please have patient to call [phone], between 1-3:00pm the day before surgery, to find out what time to arrive for surgery.

## 2020-02-08 NOTE — Telephone Encounter (Signed)
Austin Rubio spoke with patient's wife on 02/07/20 and they are informed of all dates related to his surgery

## 2020-02-18 ENCOUNTER — Telehealth: Payer: Self-pay | Admitting: Surgery

## 2020-02-18 ENCOUNTER — Encounter
Admission: RE | Admit: 2020-02-18 | Discharge: 2020-02-18 | Disposition: A | Discharge status: Home / Self Care | Payer: Self-pay | Source: Ambulatory Visit | Attending: Surgery | Admitting: Surgery

## 2020-02-18 ENCOUNTER — Other Ambulatory Visit: Payer: Self-pay

## 2020-02-18 HISTORY — DX: Other complications of anesthesia, initial encounter: T88.59XA

## 2020-02-18 HISTORY — DX: Hyperlipidemia, unspecified: E78.5

## 2020-02-18 NOTE — Telephone Encounter (Signed)
Spoke with Isa Rankin (daughter) who indicated the patient's orange card ([place] financial assistance) expired 02/11/20 and has an appointment 02/25/20 in an attempt to reinstate.  She will notify the office ASAP about keeping surgery for 02/26/20 or even the potential to move up to 02/21/20.

## 2020-02-18 NOTE — Patient Instructions (Signed)
Your procedure is scheduled on: Tuesday February 26, 2020 Su procedimiento est programado para: Martes 27 de Abril del 2021 Report to Day Surgery inside [facility]. Presntese a: Publishing rights manager. To find out your arrival time please call [phone] between 1PM - 3PM on Monday February 25, 2020. Para saber su hora de llegada por favor llame al [phone] entre la 1PM - 3PM el da: Lunes 26 de Abril del 2021   Remember: Instructions that are not followed completely may result in serious medical risk, up to and including death,  or upon the discretion of your surgeon and anesthesiologist your surgery may need to be rescheduled.  Recuerde: Las instrucciones que no se siguen completamente Heritage manager en un riesgo de salud grave, incluyendo hasta  la [place] o a discrecin de su cirujano y Environmental health practitioner, su ciruga se puede posponer.   __X_ 1.Do not eat food after midnight the night before your procedure. No    gum chewing or hard candies. You may drink clear liquids up to 2 hours     before you are scheduled to arrive for your surgery- DO not drink clear     Liquids within 2 hours of the start of your surgery.     Clear Liquids include:    water, apple juice without pulp, clear carbohydrate drink such as    Clearfast of Gartorade, Black Coffee or Tea (Do not add anything to coffee or tea).      No coma nada despus de la medianoche de la noche anterior a su    procedimiento. No coma chicles ni caramelos duros. Puede tomar    lquidos claros hasta 2 horas antes de su hora programada de llegada al     hospital para su procedimiento. No tome lquidos claros durante el     transcurso de las 2 horas de su llegada programada al hospital para su     procedimiento, ya que esto puede llevar a que su procedimiento se    retrase o tenga que volver a Health and safety inspector.  Los lquidos claros incluyen:          - Agua o jugo de [place] sin pulpa          - Bebidas claras con  carbohidratos como ClearFast o Gatorade          - Caf negro o t claro (sin leche, sin cremas, no agregue nada al caf ni al t)  No tome nada que no est en esta lista.  Los pacientes con diabetes tipo 1 y tipo 2 solo deben Agricultural engineer.  Llame a la clnica de PreCare o a la unidad de Same Day Surgery si  tiene alguna pregunta sobre estas instrucciones.              _X__ 2.Do Not Smoke or use e-cigarettes For 24 Hours Prior to Your Surgery.    Do not use any chewable tobacco products for at least 6   hours prior to surgery.    No fume ni use cigarrillos electrnicos durante las 24 horas previas    a su [place].  No use ningn producto de tabaco masticable durante   al menos 6 horas antes de la [place].     __X_ 3. No alcohol for 24 hours before or after surgery.    No tome alcohol durante las 24 horas antes ni despus de la [place].   __X__ 4. Notify your doctor if there is any change in your medical  condition (cold,fever, infections).    Informe a su mdico si hay algn cambio en su condicin mdica  (resfriado, fiebre, infecciones).   Do not wear jewelry, make-up, hairpins, clips or nail polish.  No use joyas, maquillajes, pinzas/ganchos para el cabello ni esmalte de uas.  Do not wear lotions, powders, or perfumes. You may wear deodorant.  No use lociones, polvos o perfumes.  Puede usar desodorante.    Do not shave 48 hours prior to surgery. Men may shave face and neck.  No se afeite 48 horas antes de la [place].  Los hombres pueden [employer] cara  y el cuello.   Do not bring valuables to the hospital.   No lleve objetos [facility] is not responsible for any belongings or valuables.  [place] no se hace responsable de ningn tipo de pertenencias u objetos de Geographical information systems officer.               Contacts, dentures or bridgework may not be worn into surgery.  Los lentes de [employer], las dentaduras postizas o puentes no se pueden usar en la [place].   Leave your  suitcase in the car. After surgery it may be brought to your room.  Deje su maleta en el auto.  Despus de la ciruga podr traerla a su habitacin.   For patients admitted to the hospital, discharge time is determined by your  treatment team.  Para los pacientes que sean ingresados al hospital, el tiempo en el cual se le  dar de alta es determinado por su equipo de [place].   Patients discharged the day of surgery will not be allowed to drive home. A los pacientes que se les da de alta el mismo da de la ciruga no se les permitir conducir a Holiday representative.    __X__ Take these medicines the morning of surgery with A SIP OF WATER:          Tome estas medicinas la maana de la ciruga con UN SORBO DE AGUA:  1. amLODipine (NORVASC) 10 MG      __X__ Use CHG Soap as directed          Utilice el jabn de CHG segn lo indicado  __X__ Stop metformin 2 days prior to surgery (last dose Saturday February 23, 2020)          Deje de tomar el metformin 2 das antes de la ciruga (ulitma dosis Sabado 24 de Abril)   __X__ Stop Anti-inflammatories such as ibuprofen, Aleve, naproxen, aspirin and or BC powders.           Deje de tomar antiinflamatorios como ibuprofen, Aleven, naproxen, aspirinas o polvos de BC powders.    __X__ Stop supplements until after surgery            Deje de tomar suplementos hasta despus de la ciruga  __X__ Do not start any herbal supplements before your surgery.          No empieze a tomar suplementos de hierbas antes de su cirugia.

## 2020-02-18 NOTE — Telephone Encounter (Signed)
Spoke with Isa Rankin (daughter) she has been advised of Pre-Admission date/time, COVID Testing date and Surgery date for her father.    Surgery Date: 03/06/20 Preadmission Testing Date: 02/18/20, spoke with daughter, Isa Rankin who confirms this has already been done.    Covid Testing Date: 03/04/20 - patient advised to go to the [place] ([place]) between 8a-1p   Also patient and Isa Rankin (daughter)  has been made aware to call [phone], between 1-3:00pm the day before surgery, to find out what time to arrive for surgery.

## 2020-02-18 NOTE — Telephone Encounter (Signed)
Daughter called back, asking if we would go ahead and reschedule surgery for 1st week of May 2021

## 2020-02-22 ENCOUNTER — Other Ambulatory Visit: Payer: Self-pay

## 2020-02-25 ENCOUNTER — Other Ambulatory Visit: Payer: Self-pay

## 2020-02-25 ENCOUNTER — Ambulatory Visit: Payer: Self-pay

## 2020-02-25 MED FILL — AMLODIPINE BESYLATE 10 MG T: 10 | 30 days supply | Qty: 30 | Fill #5

## 2020-02-27 ENCOUNTER — Ambulatory Visit: Payer: Self-pay | Admitting: Family Medicine

## 2020-03-04 ENCOUNTER — Other Ambulatory Visit: Payer: Self-pay

## 2020-03-04 ENCOUNTER — Other Ambulatory Visit
Admission: RE | Admit: 2020-03-04 | Discharge: 2020-03-04 | Disposition: A | Discharge status: Home / Self Care | Payer: HRSA Program | Source: Ambulatory Visit | Attending: Surgery | Admitting: Surgery

## 2020-03-04 DIAGNOSIS — Z20822 Contact with and (suspected) exposure to covid-19: Secondary | ICD-10-CM | POA: Diagnosis not present

## 2020-03-04 DIAGNOSIS — Z01812 Encounter for preprocedural laboratory examination: Secondary | ICD-10-CM | POA: Insufficient documentation

## 2020-03-05 LAB — SARS CORONAVIRUS 2 (TAT 6-24 HRS): SARS Coronavirus 2: NEGATIVE

## 2020-03-06 ENCOUNTER — Ambulatory Visit
Admission: RE | Admit: 2020-03-06 | Discharge: 2020-03-06 | Disposition: A | Discharge status: Home / Self Care | Payer: Self-pay | Attending: Surgery | Admitting: Surgery

## 2020-03-06 ENCOUNTER — Other Ambulatory Visit: Payer: Self-pay

## 2020-03-06 ENCOUNTER — Ambulatory Visit: Payer: Self-pay | Admitting: Anesthesiology

## 2020-03-06 ENCOUNTER — Encounter: Payer: Self-pay | Admitting: Surgery

## 2020-03-06 ENCOUNTER — Encounter
Admission: RE | Disposition: A | Discharge status: Home / Self Care | Payer: Self-pay | Source: Home / Self Care | Attending: Surgery

## 2020-03-06 DIAGNOSIS — Z79899 Other long term (current) drug therapy: Secondary | ICD-10-CM | POA: Insufficient documentation

## 2020-03-06 DIAGNOSIS — E785 Hyperlipidemia, unspecified: Secondary | ICD-10-CM | POA: Insufficient documentation

## 2020-03-06 DIAGNOSIS — K4090 Unilateral inguinal hernia, without obstruction or gangrene, not specified as recurrent: Secondary | ICD-10-CM | POA: Insufficient documentation

## 2020-03-06 DIAGNOSIS — Z7984 Long term (current) use of oral hypoglycemic drugs: Secondary | ICD-10-CM | POA: Insufficient documentation

## 2020-03-06 DIAGNOSIS — I10 Essential (primary) hypertension: Secondary | ICD-10-CM | POA: Insufficient documentation

## 2020-03-06 DIAGNOSIS — R7303 Prediabetes: Secondary | ICD-10-CM | POA: Insufficient documentation

## 2020-03-06 DIAGNOSIS — K439 Ventral hernia without obstruction or gangrene: Secondary | ICD-10-CM | POA: Insufficient documentation

## 2020-03-06 HISTORY — PX: XI ROBOTIC ASSISTED VENTRAL HERNIA: SHX6789

## 2020-03-06 LAB — GLUCOSE, CAPILLARY
Glucose-Capillary: 97 mg/dL (ref 70–99)
Glucose-Capillary: 147 mg/dL — ABNORMAL HIGH (ref 70–99)

## 2020-03-06 SURGERY — REPAIR, HERNIA, VENTRAL, ROBOT-ASSISTED
Anesthesia: General

## 2020-03-06 MED ORDER — BUPIVACAINE LIPOSOME 1.3 % IJ SUSP
INTRAMUSCULAR | Status: DC | PRN
  Administered 2020-03-06: 20 mL

## 2020-03-06 MED ORDER — ROCURONIUM BROMIDE 100 MG/10ML IV SOLN
INTRAVENOUS | Status: DC | PRN
  Administered 2020-03-06: 50 mg via INTRAVENOUS
  Administered 2020-03-06: 20 mg via INTRAVENOUS

## 2020-03-06 MED ORDER — LIDOCAINE HCL URETHRAL/MUCOSAL 2 % EX GEL
CUTANEOUS | Status: AC
  Filled 2020-03-06: qty 5

## 2020-03-06 MED ORDER — FAMOTIDINE 20 MG PO TABS: 20.0000 mg | ORAL_TABLET | Freq: Once | ORAL | Status: AC

## 2020-03-06 MED ORDER — CELECOXIB 200 MG PO CAPS
ORAL_CAPSULE | ORAL | Status: AC
  Administered 2020-03-06: 200 mg via ORAL
  Filled 2020-03-06: qty 1

## 2020-03-06 MED ORDER — OXYCODONE HCL 5 MG PO TABS: 5.0000 mg | ORAL_TABLET | Freq: Once | ORAL | Status: DC | PRN

## 2020-03-06 MED ORDER — ROCURONIUM BROMIDE 10 MG/ML (PF) SYRINGE
PREFILLED_SYRINGE | INTRAVENOUS | Status: AC
  Filled 2020-03-06: qty 10

## 2020-03-06 MED ORDER — BUPIVACAINE HCL (PF) 0.25 % IJ SOLN
INTRAMUSCULAR | Status: AC
  Filled 2020-03-06: qty 30

## 2020-03-06 MED ORDER — MIDAZOLAM HCL 2 MG/2ML IJ SOLN
INTRAMUSCULAR | Status: DC | PRN
  Administered 2020-03-06: 2 mg via INTRAVENOUS

## 2020-03-06 MED ORDER — LIDOCAINE HCL (CARDIAC) PF 100 MG/5ML IV SOSY
PREFILLED_SYRINGE | INTRAVENOUS | Status: DC | PRN
  Administered 2020-03-06: 100 mg via INTRAVENOUS

## 2020-03-06 MED ORDER — OXYCODONE HCL 5 MG/5ML PO SOLN: 5.0000 mg | Freq: Once | ORAL | Status: DC | PRN

## 2020-03-06 MED ORDER — FENTANYL CITRATE (PF) 100 MCG/2ML IJ SOLN
INTRAMUSCULAR | Status: DC | PRN
  Administered 2020-03-06 (×4): 50 ug via INTRAVENOUS

## 2020-03-06 MED ORDER — SODIUM CHLORIDE (PF) 0.9 % IJ SOLN
INTRAMUSCULAR | Status: AC
  Filled 2020-03-06: qty 50

## 2020-03-06 MED ORDER — CHLORHEXIDINE GLUCONATE CLOTH 2 % EX PADS
6.0000 | MEDICATED_PAD | Freq: Once | CUTANEOUS | Status: AC
  Administered 2020-03-06: 6 via TOPICAL

## 2020-03-06 MED ORDER — MIDAZOLAM HCL 2 MG/2ML IJ SOLN
INTRAMUSCULAR | Status: AC
  Filled 2020-03-06: qty 2

## 2020-03-06 MED ORDER — HYDROCODONE-ACETAMINOPHEN 5-325 MG PO TABS: 1.0000 | ORAL_TABLET | Freq: Four times a day (QID) | ORAL | 0 refills | Status: DC | PRN

## 2020-03-06 MED ORDER — DEXAMETHASONE SODIUM PHOSPHATE 10 MG/ML IJ SOLN
INTRAMUSCULAR | Status: DC | PRN
  Administered 2020-03-06: 10 mg via INTRAVENOUS

## 2020-03-06 MED ORDER — EPINEPHRINE PF 1 MG/ML IJ SOLN
INTRAMUSCULAR | Status: AC
  Filled 2020-03-06: qty 1

## 2020-03-06 MED ORDER — GABAPENTIN 300 MG PO CAPS: 300.0000 mg | ORAL_CAPSULE | ORAL | Status: AC

## 2020-03-06 MED ORDER — FENTANYL CITRATE (PF) 100 MCG/2ML IJ SOLN
INTRAMUSCULAR | Status: AC
  Filled 2020-03-06: qty 2

## 2020-03-06 MED ORDER — ONDANSETRON HCL 4 MG/2ML IJ SOLN: 4.0000 mg | Freq: Once | INTRAMUSCULAR | Status: DC | PRN

## 2020-03-06 MED ORDER — CEFAZOLIN SODIUM-DEXTROSE 2-4 GM/100ML-% IV SOLN
INTRAVENOUS | Status: AC
  Filled 2020-03-06: qty 100

## 2020-03-06 MED ORDER — BUPIVACAINE-EPINEPHRINE 0.25% -1:200000 IJ SOLN
INTRAMUSCULAR | Status: DC | PRN
  Administered 2020-03-06: 30 mL

## 2020-03-06 MED ORDER — LIDOCAINE HCL (PF) 2 % IJ SOLN
INTRAMUSCULAR | Status: AC
  Filled 2020-03-06: qty 5

## 2020-03-06 MED ORDER — PROPOFOL 10 MG/ML IV BOLUS
INTRAVENOUS | Status: DC | PRN
  Administered 2020-03-06: 150 mg via INTRAVENOUS

## 2020-03-06 MED ORDER — ACETAMINOPHEN 500 MG PO TABS: 1000.0000 mg | ORAL_TABLET | ORAL | Status: AC

## 2020-03-06 MED ORDER — ONDANSETRON HCL 4 MG/2ML IJ SOLN
INTRAMUSCULAR | Status: DC | PRN
  Administered 2020-03-06 (×2): 4 mg via INTRAVENOUS

## 2020-03-06 MED ORDER — FAMOTIDINE 20 MG PO TABS
ORAL_TABLET | ORAL | Status: AC
  Administered 2020-03-06: 20 mg via ORAL
  Filled 2020-03-06: qty 1

## 2020-03-06 MED ORDER — GABAPENTIN 300 MG PO CAPS
ORAL_CAPSULE | ORAL | Status: AC
  Administered 2020-03-06: 300 mg via ORAL
  Filled 2020-03-06: qty 1

## 2020-03-06 MED ORDER — ACETAMINOPHEN 500 MG PO TABS
ORAL_TABLET | ORAL | Status: AC
  Administered 2020-03-06: 1000 mg via ORAL
  Filled 2020-03-06: qty 2

## 2020-03-06 MED ORDER — GLYCOPYRROLATE 0.2 MG/ML IJ SOLN
INTRAMUSCULAR | Status: DC | PRN
  Administered 2020-03-06: .2 mg via INTRAVENOUS

## 2020-03-06 MED ORDER — CELECOXIB 200 MG PO CAPS: 200.0000 mg | ORAL_CAPSULE | ORAL | Status: AC

## 2020-03-06 MED ORDER — FENTANYL CITRATE (PF) 100 MCG/2ML IJ SOLN
INTRAMUSCULAR | Status: AC
  Administered 2020-03-06: 25 ug via INTRAVENOUS
  Filled 2020-03-06: qty 2

## 2020-03-06 MED ORDER — LACTATED RINGERS IV SOLN: INTRAVENOUS | Status: DC | PRN

## 2020-03-06 MED ORDER — SODIUM CHLORIDE 0.9 % IV SOLN
INTRAVENOUS | Status: DC
  Administered 2020-03-06: 50 mL/h via INTRAVENOUS

## 2020-03-06 MED ORDER — KETOROLAC TROMETHAMINE 30 MG/ML IJ SOLN
INTRAMUSCULAR | Status: DC | PRN
  Administered 2020-03-06: 30 mg via INTRAVENOUS

## 2020-03-06 MED ORDER — BUPIVACAINE LIPOSOME 1.3 % IJ SUSP
INTRAMUSCULAR | Status: AC
  Filled 2020-03-06: qty 20

## 2020-03-06 MED ORDER — FENTANYL CITRATE (PF) 100 MCG/2ML IJ SOLN
25.0000 ug | INTRAMUSCULAR | Status: DC | PRN
  Administered 2020-03-06 (×2): 25 ug via INTRAVENOUS

## 2020-03-06 MED ORDER — SUGAMMADEX SODIUM 500 MG/5ML IV SOLN
INTRAVENOUS | Status: DC | PRN
  Administered 2020-03-06: 450 mg via INTRAVENOUS

## 2020-03-06 MED ORDER — CEFAZOLIN SODIUM-DEXTROSE 2-4 GM/100ML-% IV SOLN
2.0000 g | INTRAVENOUS | Status: AC
  Administered 2020-03-06: 2 g via INTRAVENOUS

## 2020-03-06 MED ORDER — ESMOLOL HCL 100 MG/10ML IV SOLN
INTRAVENOUS | Status: DC | PRN
  Administered 2020-03-06: 25 mg via INTRAVENOUS

## 2020-03-06 SURGICAL SUPPLY — 50 items
ADH SKN CLS APL DERMABOND .7 (GAUZE/BANDAGES/DRESSINGS) ×1
APL PRP STRL LF DISP 70% ISPRP (MISCELLANEOUS) ×1
BLADE SURG SZ11 CARB STEEL (BLADE) ×3 IMPLANT
CANISTER SUCT 1200ML W/VALVE (MISCELLANEOUS) ×3 IMPLANT
CHLORAPREP W/TINT 26 (MISCELLANEOUS) ×3 IMPLANT
COVER TIP SHEARS 8 DVNC (MISCELLANEOUS) ×1 IMPLANT
COVER TIP SHEARS 8MM DA VINCI (MISCELLANEOUS) ×3
COVER WAND RF STERILE (DRAPES) ×3 IMPLANT
DEFOGGER SCOPE WARMER CLEARIFY (MISCELLANEOUS) ×3 IMPLANT
DERMABOND ADVANCED (GAUZE/BANDAGES/DRESSINGS) ×2
DERMABOND ADVANCED .7 DNX12 (GAUZE/BANDAGES/DRESSINGS) ×1 IMPLANT
DRAPE 3/4 80X56 (DRAPES) ×3 IMPLANT
DRAPE ARM DVNC X/XI (DISPOSABLE) ×4 IMPLANT
DRAPE COLUMN DVNC XI (DISPOSABLE) ×1 IMPLANT
DRAPE DA VINCI XI ARM (DISPOSABLE) ×12
DRAPE DA VINCI XI COLUMN (DISPOSABLE) ×3
ELECT CAUTERY BLADE 6.4 (BLADE) ×3 IMPLANT
ELECT REM PT RETURN 9FT ADLT (ELECTROSURGICAL) ×3
ELECTRODE REM PT RTRN 9FT ADLT (ELECTROSURGICAL) ×1 IMPLANT
GLOVE BIO SURGEON STRL SZ7 (GLOVE) ×6 IMPLANT
GOWN STRL REUS W/ TWL LRG LVL3 (GOWN DISPOSABLE) ×3 IMPLANT
GOWN STRL REUS W/TWL LRG LVL3 (GOWN DISPOSABLE) ×9
GRASPER SUT TROCAR 14GX15 (MISCELLANEOUS) ×3 IMPLANT
IRRIGATION STRYKERFLOW (MISCELLANEOUS) IMPLANT
IRRIGATOR STRYKERFLOW (MISCELLANEOUS)
IV NS 1000ML (IV SOLUTION)
IV NS 1000ML BAXH (IV SOLUTION) IMPLANT
KIT PINK PAD W/HEAD ARE REST (MISCELLANEOUS) ×3
KIT PINK PAD W/HEAD ARM REST (MISCELLANEOUS) ×1 IMPLANT
LABEL OR SOLS (LABEL) ×3 IMPLANT
MESH VENT LT ST 11.4CM CRL (Mesh General) ×3 IMPLANT
NEEDLE HYPO 22GX1.5 SAFETY (NEEDLE) ×3 IMPLANT
OBTURATOR OPTICAL STANDARD 8MM (TROCAR) ×3
OBTURATOR OPTICAL STND 8 DVNC (TROCAR) ×1
OBTURATOR OPTICALSTD 8 DVNC (TROCAR) ×1 IMPLANT
PACK LAP CHOLECYSTECTOMY (MISCELLANEOUS) ×3 IMPLANT
PENCIL ELECTRO HAND CTR (MISCELLANEOUS) ×3 IMPLANT
SEAL CANN UNIV 5-8 DVNC XI (MISCELLANEOUS) ×3 IMPLANT
SEAL XI 5MM-8MM UNIVERSAL (MISCELLANEOUS) ×9
SET TUBE SMOKE EVAC HIGH FLOW (TUBING) ×3 IMPLANT
SOLUTION ELECTROLUBE (MISCELLANEOUS) ×3 IMPLANT
SPONGE LAP 18X18 RF (DISPOSABLE) ×3 IMPLANT
SUT MNCRL 4-0 (SUTURE) ×3
SUT MNCRL 4-0 27XMFL (SUTURE) ×1
SUT STRATAFIX PDS 30 CT-1 (SUTURE) ×3 IMPLANT
SUT VICRYL 0 AB UR-6 (SUTURE) ×6 IMPLANT
SUT VLOC 90 2/L VL 12 GS22 (SUTURE) ×6 IMPLANT
SUTURE MNCRL 4-0 27XMF (SUTURE) ×1 IMPLANT
TAPE TRANSPORE STRL 2 31045 (GAUZE/BANDAGES/DRESSINGS) ×3 IMPLANT
TROCAR 130MM GELPORT  DAV (MISCELLANEOUS) ×3 IMPLANT

## 2020-03-06 NOTE — Discharge Instructions (Addendum)

## 2020-03-06 NOTE — Anesthesia Preprocedure Evaluation (Addendum)
Anesthesia Evaluation  Patient identified by MRN, date of birth, ID band Patient awake    Reviewed: Allergy & Precautions, H&P , NPO status , Patient's Chart, lab work & pertinent test results  History of Anesthesia Complications (+) history of anesthetic complications (reports syncopal event at home after anesthesia)  Airway Mallampati: II  TM Distance: >3 FB Neck ROM: full    Dental  (+) Teeth Intact   Pulmonary neg pulmonary ROS, neg COPD, Not current smoker,    breath sounds clear to auscultation       Cardiovascular hypertension, (-) angina(-) Past MI (-) dysrhythmias  Rhythm:regular Rate:Normal     Neuro/Psych negative neurological ROS  negative psych ROS   GI/Hepatic negative GI ROS, Neg liver ROS,   Endo/Other  negative endocrine ROS  Renal/GU      Musculoskeletal   Abdominal   Peds  Hematology negative hematology ROS (+)   Anesthesia Other Findings Past Medical History: No date: Complication of anesthesia     Comment:  syncope after bgetting home from surgery.  No date: Hyperlipidemia No date: Pre-diabetes 05/17/2013: Unspecified essential hypertension  Past Surgical History: No date: HERNIA REPAIR     Reproductive/Obstetrics negative OB ROS                            Anesthesia Physical Anesthesia Plan  ASA: II  Anesthesia Plan: General ETT   Post-op Pain Management:    Induction:   PONV Risk Score and Plan: Ondansetron, Dexamethasone, Midazolam and Treatment may vary due to age or medical condition  Airway Management Planned:   Additional Equipment:   Intra-op Plan:   Post-operative Plan:   Informed Consent: I have reviewed the patients History and Physical, chart, labs and discussed the procedure including the risks, benefits and alternatives for the proposed anesthesia with the patient or authorized representative who has indicated his/her understanding  and acceptance.     Dental Advisory Given  Plan Discussed with: Anesthesiologist  Anesthesia Plan Comments:         Anesthesia Quick Evaluation

## 2020-03-06 NOTE — Op Note (Addendum)
Robotic assisted laparoscopic ventral hernia Repair IPOM using  Round 11.4cm ventralight BARD mesh   Pre-operative Diagnosis: ventral hernia   Post-operative Diagnosis: same   Surgeon: Caroleen Hamman, MD FACS   Anesthesia: Gen. with endotracheal tube     Findings: 3 cm ventral hernia Left indirect Inguinal hernia   Estimated Blood Loss: 5cc         Complications: none             Procedure Details  The patient was seen again in the Holding Room. The benefits, complications, treatment options, and expected outcomes were discussed with the patient. The risks of bleeding, infection, recurrence of symptoms, failure to resolve symptoms, bowel injury, mesh placement, mesh infection, any of which could require further surgery were reviewed with the patient. The likelihood of improving the patient's symptoms with return to their baseline status is good.  The patient and/or family concurred with the proposed plan, giving informed consent.  The patient was taken to Operating Room, identified and the procedure verified.  A Time Out was held and the above information confirmed.   Prior to the induction of general anesthesia, antibiotic prophylaxis was administered. VTE prophylaxis was in place. General endotracheal anesthesia was then administered and tolerated well. After the induction, the abdomen was prepped with Chloraprep and draped in the sterile fashion. The patient was positioned in the supine position.   We used a left upper quadrant subcostal incision and using a cutdown technique were able to identify the fascia both anteriorly and posteriorly elevated incised and 2 stay sutures were placed.  Hassan trocar inserted and pneumoperitoneum obtained.  No hemodynamic compromise.   2 additional 8 mm ports were placed under direct visualization.  I visualized the hernia and there was an epigastric ventral hernia measuring approximately 3 cm. At this time I went ahead and inserted the  Round 11.4cm  mesh with an echo location.   The robot was brought to the surgical field and docked in the standard fashion.  We made sure that all instrumentation was kept under direct vision at all times and there was no collision between the arms.  I scrubbed out and went to the console.   Confirm and measured that the defect was 3 cm.  .  Using a 0V lock suture we closed the ventral defect primarily.  The PMI was used to pierce the defect in the center.  Using the mesh and the echo location device we were able to bring the mesh towards the abdominal wall. Falciform was taken down with electrocautery to allow adequate mesh placement.   The mesh was secured circumferentially to the abdominal wall using 2 OV lock in the standard fashion.   The mesh layed really nicely against the  abdominal wall. A second look laparoscopy revealed no evidence of intra-abdominal injury.    All the needles and foreign objects were removed under direct visualization.  The instruments were removed and the robot was undocked.  I scrubbed back in, The laparoscopic ports were removed under direct visualization and the pneumoperitoneum was deflated.   Incisions were closed with  4-0 Monocryl  And the fascial sutures approximated in the standard fashion Dermabond was used to coat the skin. liposomal Marcaine  was used to inject all the incision sites. Patient tolerated procedure well and there were no immediate complications. Needle and laparotomy counts were correct    Caroleen Hamman, MD, FACS

## 2020-03-06 NOTE — H&P (Signed)
HPI Austin Rubio is a 44 y.o. male seen in consultation at the request of Dr. Chapman Fitch for a ventral hernia.  He reports that he has had a bulge for the last few months on he does endorse some intermittent pain whenever he does exercise or does any Valsalva maneuver.  The pain is intermittent sharp in nature and moderate in intensity.  Of note he did have open inguinal hernia repair several years ago.  He works in Architect and Dealer.  He is able to perform more than 4 METS of activity without any shortness of breath or chest pain.  There is no imaging studies.  I have review his CBC and CMP that were normal and a hemoglobin 1 AC of 6. He is hypertensive but apparently he did not take his blood pressure medicine today.  HPI      Past Medical History:  Diagnosis Date  . Unspecified essential hypertension 05/17/2013         Past Surgical History:  Procedure Laterality Date  . HERNIA REPAIR           Family History  Problem Relation Age of Onset  . Diabetes Mother   . Heart disease Father     Social History Social History        Tobacco Use  . Smoking status: Never Smoker  . Smokeless tobacco: Never Used  Substance Use Topics  . Alcohol use: No    Comment: socially  . Drug use: No    No Known Allergies        Current Outpatient Medications  Medication Sig Dispense Refill  . amLODipine (NORVASC) 10 MG tablet One pill once per day to lower blood pressure 90 tablet 1  . metFORMIN (GLUCOPHAGE) 500 MG tablet Take 2 pills after the evening meal 180 tablet 1   No current facility-administered medications for this visit.     Review of Systems Full ROS  was asked and was negative except for the information on the HPI  Physical ExamCONSTITUTIONAL: NAD EYES: Pupils are equal, round, and reactive to light, Sclera are non-icteric. EARS, NOSE, MOUTH AND THROAT: Wearing a mask. Hearing is intact to voice. LYMPH NODES:  Lymph nodes in the neck  are normal. RESPIRATORY:  Lungs are clear. There is normal respiratory effort, with equal breath sounds bilaterally, and without pathologic use of accessory muscles. CARDIOVASCULAR: Heart is regular without murmurs, gallops, or rubs. GI: The abdomen is  soft, nontender, and nondistended. Reducible ventral hernia on epigastrium measures aprx 3 cms. There are no palpable masses. There is no hepatosplenomegaly. There are normal bowel sounds in all quadrants. GU: Rectal deferred.   MUSCULOSKELETAL: Normal muscle strength and tone. No cyanosis or edema.   SKIN: Turgor is good and there are no pathologic skin lesions or ulcers. NEUROLOGIC: Motor and sensation is grossly normal. Cranial nerves are grossly intact. PSYCH:  Oriented to person, place and time. Affect is normal.  Data Reviewed  I have personally reviewed the patient's imaging, laboratory findings and medical records.    Assessment/Plan 44 year old male with symptomatic ventral hernia.  I do think that he is a good candidate for robotic approach.  Procedure discussed with the patient in detail.  Risk, benefits and possible complications including but not limited to: Bleeding, infection, injury to adjacent structures recurrence and pain.  He understands and wishes to proceed.  Caroleen Hamman, MD [facility] General Surgeon

## 2020-03-06 NOTE — Anesthesia Procedure Notes (Addendum)
Procedure Name: Intubation Performed by: Kelton Pillar, CRNA Pre-anesthesia Checklist: Patient identified, Emergency Drugs available, Suction available and Patient being monitored Patient Re-evaluated:Patient Re-evaluated prior to induction Oxygen Delivery Method: Circle system utilized Preoxygenation: Pre-oxygenation with 100% oxygen Induction Type: IV induction Ventilation: Mask ventilation without difficulty Laryngoscope Size: McGraph and 3 Grade View: Grade I Tube type: Oral Tube size: 7.0 mm Number of attempts: 1 Airway Equipment and Method: Stylet,  Oral airway and Video-laryngoscopy Placement Confirmation: ETT inserted through vocal cords under direct vision,  positive ETCO2 and breath sounds checked- equal and bilateral Secured at: 21 cm Tube secured with: Tape Dental Injury: Teeth and Oropharynx as per pre-operative assessment

## 2020-03-06 NOTE — Transfer of Care (Signed)
Immediate Anesthesia Transfer of Care Note  Patient: Austin Rubio  Procedure(s) Performed: XI ROBOTIC ASSISTED VENTRAL HERNIA (N/A )  Patient Location: PACU  Anesthesia Type:General  Level of Consciousness: awake, drowsy and patient cooperative  Airway & Oxygen Therapy: Patient Spontanous Breathing and Patient connected to face mask oxygen  Post-op Assessment: Report given to RN and Post -op Vital signs reviewed and stable  Post vital signs: Reviewed and stable  Last Vitals:  Vitals Value Taken Time  BP 156/97 03/06/20 1357  Temp    Pulse 86 03/06/20 1400  Resp 17 03/06/20 1400  SpO2 99 % 03/06/20 1400  Vitals shown include unvalidated device data.  Last Pain:  Vitals:   03/06/20 1054  TempSrc: Tympanic  PainSc: 0-No pain         Complications: No apparent anesthesia complications

## 2020-03-07 NOTE — Anesthesia Postprocedure Evaluation (Signed)
Anesthesia Post Note  Patient: Austin Rubio  Procedure(s) Performed: XI ROBOTIC ASSISTED VENTRAL HERNIA (N/A )  Patient location during evaluation: PACU Anesthesia Type: General Level of consciousness: awake and alert Pain management: pain level controlled Vital Signs Assessment: post-procedure vital signs reviewed and stable Respiratory status: spontaneous breathing, nonlabored ventilation and respiratory function stable Cardiovascular status: blood pressure returned to baseline and stable Postop Assessment: no apparent nausea or vomiting Anesthetic complications: no     Last Vitals:  Vitals:   03/06/20 1556 03/06/20 1601  BP: 127/80 135/82  Pulse: 76 73  Resp: 20 17  Temp: (!) 36.4 C 36.6 C  SpO2: 95% 98%    Last Pain:  Vitals:   03/07/20 0854  TempSrc:   PainSc: [name]

## 2020-03-15 ENCOUNTER — Telehealth: Payer: Self-pay | Admitting: General Surgery

## 2020-03-15 NOTE — Telephone Encounter (Signed)
Patient called the answering service reporting that he still have pain 10 out of 10 and he is out of the pain medications.  He has been trying to take Tylenol or ibuprofen without improvement.  My recommendations were that since he has been a week out of surgery with a minimally invasive approach and he is still having persistent pain 10 out of 10 he should come to the ED for further evaluation.

## 2020-03-17 ENCOUNTER — Telehealth: Payer: Self-pay | Admitting: Emergency Medicine

## 2020-03-17 NOTE — Telephone Encounter (Signed)
Called patient in regards to his pain he was having over the weekend. Pt states his pain has gotten better. Taking Tylenol and Ibuprofen. No F, N, V, D. Appetite is good. Per patient Incision site looks good, no drainage no redness.  Offered pt an appt today to be seen. Pt refused and stated he is feeling better and wants to keep 03/26/20 appt.  Advised pt to call if pain comes back and wants to be seen sooner. Pt voiced understanding and has no further concerns.

## 2020-03-25 ENCOUNTER — Other Ambulatory Visit: Payer: Self-pay | Admitting: Family Medicine

## 2020-03-25 DIAGNOSIS — I10 Essential (primary) hypertension: Secondary | ICD-10-CM

## 2020-03-25 MED FILL — ?AMLODIPINE BESYL 10MG TABL: 10 | 30 days supply | Qty: 30 | Fill #0

## 2020-03-26 ENCOUNTER — Other Ambulatory Visit: Payer: Self-pay

## 2020-03-26 ENCOUNTER — Ambulatory Visit (INDEPENDENT_AMBULATORY_CARE_PROVIDER_SITE_OTHER): Payer: Self-pay | Admitting: Surgery

## 2020-03-26 ENCOUNTER — Encounter: Payer: Self-pay | Admitting: Surgery

## 2020-03-26 VITALS — BP 138/85 | HR 61 | Temp 97.7°F | Resp 12 | Ht 65.0 in | Wt 195.4 lb

## 2020-03-26 DIAGNOSIS — Z09 Encounter for follow-up examination after completed treatment for conditions other than malignant neoplasm: Secondary | ICD-10-CM

## 2020-03-26 NOTE — Patient Instructions (Signed)
follow up as needed, call the office if you have any questions or concerns.   haga un seguimiento segn sea necesario, llame a la oficina si tiene alguna pregunta o inquietud.  Reparacin laparoscpica de una hernia ventral, cuidados posteriores Laparoscopic Ventral Hernia Repair, Care After Lea esta informacin sobre cmo cuidarse despus del procedimiento. Su mdico tambin podr darle indicaciones ms especficas. Comunquese con su mdico si tiene problemas o preguntas. Qu puedo esperar despus del procedimiento? Despus del procedimiento, es comn [employer] siguientes sntomas:  Dolor, molestias o sensibilidad. Siga estas indicaciones en su casa: Cuidados de la incisin   Siga las indicaciones del mdico acerca del cuidado de la incisin. Haga lo siguiente: ? Lvese las manos con agua y jabn antes de Quarry manager las vendas (vendajes) o antes de tocarse el abdomen. Use desinfectante para manos si no dispone de [place]. ? Cambie los vendajes como se lo haya indicado el mdico. ? No retire los puntos (suturas), la goma para cerrar la piel o las tiras Secretary. Es posible que estos cierres cutneos deban quedar puestos en la piel durante 2semanas o ms tiempo. Si los bordes de las tiras adhesivas empiezan a despegarse y Therapist, sports, puede recortar los que estn sueltos. No retire las tiras [facility] por completo a menos que el mdico se lo indique.  [place] zona de la incisin para detectar signos de infeccin. Est atento a los siguientes signos: ? Dolor, hinchazn o enrojecimiento. ? Lquido o sangre. ? Calor. ? Pus o mal olor. Baarse   No tome baos de inmersin, no nade ni use el jacuzzi hasta que el mdico lo autorice. Pregntele al mdico si puede ducharse. Thurston Pounds solo le permitan tomar baos de [place].  Mantenga la venda (vendaje) seca hasta que el mdico le diga que se la puede quitar. Actividad  No levante ningn objeto que pese ms de 10libras  (4,5kg) hasta que el mdico lo autorice.  No conduzca ni use maquinaria pesada mientras toma analgsicos recetados. Pregntele al mdico cundo es seguro volver a Forensic psychologist o usar maquinaria pesada.  No conduzca durante 24horas si recibi un medicamento para ayudarlo a relajarse (sedante) durante el procedimiento.  Haga reposo como se lo haya indicado el mdico. Pregntele al mdico cundo puede retomar sus actividades normales. Instrucciones generales  [employer] de venta libre y los recetados solamente como se lo haya indicado el mdico.  A fin de prevenir o tratar el estreimiento mientras toma analgsicos recetados, el mdico puede recomendarle lo siguiente: ? Tome medicamentos recetados o de venta libre. ? Consumir alimentos ricos en fibra, como frutas y verduras frescas, cereales integrales y frijoles. ? Limite el consumo de alimentos ricos en grasa y azcares procesados, como alimentos fritos o dulces.  Beba suficiente lquido para Consulting civil engineer orina clara o de color amarillo plido.  Sostenga una [employer] abdomen cuando tosa o estornude. Esto ayuda a Best boy.  Concurra a todas las visitas de control como se lo haya indicado el mdico. Esto es importante. Comunquese con un mdico si:  Tiene los siguientes sntomas: ? Cristy Hilts o escalofros. ? Enrojecimiento, hinchazn o dolor alrededor de la incisin. ? Lquido o [employer] de la incisin. ? Pus o mal olor en TEFL teacher de la incisin. ? Un dolor que empeora o que no mejora con los medicamentos. ? Nuseas o vmitos. ? Tos. ? Falta de aire.  La incisin est caliente al tacto.  No ha defecado durante  tresdas.  No puede orinar. Solicite ayuda de inmediato si:  Siente un dolor intenso en el abdomen.  Tiene nuseas o vmitos persistentes.  Tiene enrojecimiento, calor o dolor en la pierna.  Siente dolor en el pecho.  Tiene dificultad para respirar. Resumen  Despus de este  procedimiento, es comn tener dolor, [place] o sensibilidad.  Siga las indicaciones del mdico acerca del cuidado de la incisin.  [place] zona de la incisin para detectar signos de infeccin. Avsele al mdico sobre cualquier signo de infeccin.  Concurra a todas las visitas de control como se lo haya indicado el mdico. Esto es importante. Esta informacin no tiene Marine scientist el consejo del mdico. Asegrese de hacerle al mdico cualquier pregunta que tenga. Document Revised: 09/07/2017 Document Reviewed: 08/09/2012 Elsevier Patient Education  [place].

## 2020-03-28 ENCOUNTER — Encounter: Payer: Self-pay | Admitting: Surgery

## 2020-03-28 NOTE — Progress Notes (Signed)
S/p rob ventral Doing well Some soreness Taking po No fevers  PE NAD Abd: soft, incisions c/d/i , no recurrence small seroma  A./P doing well, small seroma, d/w him benign findings RTC prn No heavy lifting

## 2020-04-16 ENCOUNTER — Encounter: Payer: Self-pay | Admitting: Cardiology

## 2020-04-16 ENCOUNTER — Other Ambulatory Visit: Payer: Self-pay

## 2020-04-16 ENCOUNTER — Ambulatory Visit (INDEPENDENT_AMBULATORY_CARE_PROVIDER_SITE_OTHER): Payer: Self-pay | Admitting: Cardiology

## 2020-04-16 VITALS — BP 130/74 | HR 76 | Ht 65.0 in | Wt 200.4 lb

## 2020-04-16 DIAGNOSIS — I10 Essential (primary) hypertension: Secondary | ICD-10-CM

## 2020-04-16 DIAGNOSIS — R7303 Prediabetes: Secondary | ICD-10-CM

## 2020-04-16 DIAGNOSIS — R55 Syncope and collapse: Secondary | ICD-10-CM

## 2020-04-16 NOTE — Progress Notes (Signed)
Cardiology Office Note:    Date:  04/16/2020   ID:  Austin Rubio, DOB November 09, 1975, MRN [id number]  PCP:  Austin Blackbird, MD  Cardiologist:  No primary care provider on file.  Electrophysiologist:  None   Referring MD: Austin Blackbird, MD   Chief Complaint  Patient presents with  . Hypertension   History of Present Illness:    Austin Rubio is a 44 y.o. male with a hx of prediabetes, hypertension who presents for follow-up.  He was referred by Dr. Chapman Fitch for preop evaluation prior to hernia repair, initially seen on 01/10/2020.  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  He denies any exertional chest pain or dyspnea.  Reports that he can walk up 2 flights of stairs without stopping and without symptoms.  Does report a history of syncope after hernia surgery in 2005.  States that he passed out shortly after getting home from the procedure.  He does not remember the details.  No known history of heart disease.  Reports BP has been under good control on amlodipine.  No smoking history.  Reports father died of MI at age 27.  TTE on 2020/02/22 showed normal biventricular function, no significant valvular disease.  Since last clinic visit, he underwent hernia surgery, reports he has been recovering well.  Denies any chest pan or dyspnea.  States that he had an episode of lightheadedness when standing up following his surgery, but denies any syncopal episodes.  States that he has not been exercising.  Past Medical History:  Diagnosis Date  . Complication of anesthesia    syncope after bgetting home from surgery.   . Hyperlipidemia   . Pre-diabetes   . Unspecified essential hypertension 05/17/2013    Past Surgical History:  Procedure Laterality Date  . HERNIA REPAIR    . XI ROBOTIC ASSISTED VENTRAL HERNIA N/A 03/06/2020   Procedure: XI ROBOTIC ASSISTED VENTRAL HERNIA;  Surgeon: Jules Husbands, MD;  Location: [facility] ORS;   Service: General;  Laterality: N/A;    Current Medications: Current Meds  Medication Sig  . amLODipine (NORVASC) 10 MG tablet TAKE 1 TABLET BY MOUTH ONCE PER DAY TO LOWER BLOOD PRESSURE  . metFORMIN (GLUCOPHAGE) 500 MG tablet Take 2 pills after the evening meal (Patient taking differently: Take 1,000 mg by mouth daily with breakfast. )     Allergies:   Patient has no known allergies.   Social History   Socioeconomic History  . Marital status: Married    Spouse name: Not on file  . Number of children: Not on file  . Years of education: Not on file  . Highest education level: Not on file  Occupational History  . Not on file  Tobacco Use  . Smoking status: Never Smoker  . Smokeless tobacco: Never Used  Substance and Sexual Activity  . Alcohol use: No    Comment: socially  . Drug use: No  . Sexual activity: Yes    Partners: Female    Birth control/protection: None  Other Topics Concern  . Not on file  Social History Narrative  . Not on file   Social Determinants of Health   Financial Resource Strain:   . Difficulty of Paying Living Expenses:   Food Insecurity:   . Worried About Charity fundraiser in the Last Year:   . Arboriculturist in the Last Year:   Transportation Needs:   .  Lack of Transportation (Medical):   Marland Kitchen Lack of Transportation (Non-Medical):   Physical Activity:   . Days of Exercise per Week:   . Minutes of Exercise per Session:   Stress:   . Feeling of Stress :   Social Connections:   . Frequency of Communication with Friends and Family:   . Frequency of Social Gatherings with Friends and Family:   . Attends Religious Services:   . Active Member of Clubs or Organizations:   . Attends Archivist Meetings:   Marland Kitchen Marital Status:      Family History: The patient's family history includes Diabetes in his mother; Heart disease in his father.  ROS:   Please see the history of present illness.     All other systems reviewed and are  negative.  EKGs/Labs/Other Studies Reviewed:    The following studies were reviewed today:   EKG:  EKG is not ordered today.  The ekg ordered most recently demonstrates normal sinus rhythm, rate 66, no ST/T abnormalities  Recent Labs: 08/18/2019: ALT 70; Hemoglobin 16.9; Platelets 300 01/04/2020: BUN 15; Creatinine, Ser 0.83; Potassium 3.7; Sodium 136  Recent Lipid Panel    Component Value Date/Time   CHOL 189 03/22/2017 0840   TRIG 99 03/22/2017 0840   HDL 41 03/22/2017 0840   CHOLHDL 4.6 03/22/2017 0840   CHOLHDL 4.4 11/12/2013 1805   VLDL 36 11/12/2013 1805   LDLCALC 128 (H) 03/22/2017 0840    Physical Exam:    VS:  BP 130/74   Pulse 76   Ht 5\' 5"  (1.651 m)   Wt 200 lb 6.4 oz (90.9 kg)   SpO2 98%   BMI 33.35 kg/m     Wt Readings from Last 3 Encounters:  04/16/20 200 lb 6.4 oz (90.9 kg)  03/26/20 195 lb 6.4 oz (88.6 kg)  03/06/20 205 lb (93 kg)     GEN:  Well nourished, well developed in no acute distress HEENT: Normal NECK: No JVD; No carotid bruits LYMPHATICS: No lymphadenopathy CARDIAC: RRR, no murmurs, rubs, gallops RESPIRATORY:  Clear to auscultation without rales, wheezing or rhonchi  ABDOMEN: Soft, non-tender, non-distended MUSCULOSKELETAL:  No edema; No deformity  SKIN: Warm and dry NEUROLOGIC:  Alert and oriented x 3 PSYCHIATRIC:  Normal affect   ASSESSMENT:    1. Syncope, unspecified syncope type   2. Essential hypertension   3. Prediabetes    PLAN:     Syncope: Occurred after surgery in 2005.  Suspect related to anesthesia or dehydration from being n.p.o. for his procedure.  TTE 01/28/2020 shows no structural heart disease  Hypertension: Appears well controlled, continue amlodipine 10 mg  Prediabetes: A1c 6.0.  Continue Metformin.  RTC in 1 year   Medication Adjustments/Labs and Tests Ordered: Current medicines are reviewed at length with the patient today.  Concerns regarding medicines are outlined above.  No orders of the defined  types were placed in this encounter.  No orders of the defined types were placed in this encounter.   Patient Instructions  Medication Instructions:  Your physician recommends that you continue on your current medications as directed. Please refer to the Current Medication list given to you today.  *If you need a refill on your cardiac medications before your next appointment, please call your pharmacy*  Follow-Up: At [facility], you and your health needs are our priority.  As part of our continuing mission to provide you with exceptional heart care, we have created designated Provider Care Teams.  These Care  Teams include your primary Cardiologist (physician) and Advanced Practice Providers (APPs -  Physician Assistants and Nurse Practitioners) who all work together to provide you with the care you need, when you need it.  We recommend signing up for the patient portal called "MyChart".  Sign up information is provided on this After Visit Summary.  MyChart is used to connect with patients for Virtual Visits (Telemedicine).  Patients are able to view lab/test results, encounter notes, upcoming appointments, etc.  Non-urgent messages can be sent to your provider as well.   To learn more about what you can do with MyChart, go to [email or web address].    Your next appointment:   1 year(s)  The format for your next appointment:   In Person  Provider:   Oswaldo Milian, MD         Signed, Donato Heinz, MD  04/16/2020 11:14 PM    [place]

## 2020-04-16 NOTE — Patient Instructions (Signed)
Medication Instructions:  Your physician recommends that you continue on your current medications as directed. Please refer to the Current Medication list given to you today.  *If you need a refill on your cardiac medications before your next appointment, please call your pharmacy*  Follow-Up: At [facility], you and your health needs are our priority.  As part of our continuing mission to provide you with exceptional heart care, we have created designated Provider Care Teams.  These Care Teams include your primary Cardiologist (physician) and Advanced Practice Providers (APPs -  Physician Assistants and Nurse Practitioners) who all work together to provide you with the care you need, when you need it.  We recommend signing up for the patient portal called "MyChart".  Sign up information is provided on this After Visit Summary.  MyChart is used to connect with patients for Virtual Visits (Telemedicine).  Patients are able to view lab/test results, encounter notes, upcoming appointments, etc.  Non-urgent messages can be sent to your provider as well.   To learn more about what you can do with MyChart, go to [email or web address].    Your next appointment:   1 year(s)  The format for your next appointment:   In Person  Provider:   Oswaldo Milian, MD

## 2020-05-30 ENCOUNTER — Ambulatory Visit (HOSPITAL_COMMUNITY)
Admission: EM | Admit: 2020-05-30 | Discharge: 2020-05-30 | Disposition: A | Discharge status: Home / Self Care | Payer: Self-pay | Attending: Urgent Care | Admitting: Urgent Care

## 2020-05-30 ENCOUNTER — Encounter (HOSPITAL_COMMUNITY): Payer: Self-pay

## 2020-05-30 ENCOUNTER — Other Ambulatory Visit: Payer: Self-pay

## 2020-05-30 DIAGNOSIS — M7918 Myalgia, other site: Secondary | ICD-10-CM

## 2020-05-30 DIAGNOSIS — L03317 Cellulitis of buttock: Secondary | ICD-10-CM

## 2020-05-30 MED ORDER — DOXYCYCLINE HYCLATE 100 MG PO CAPS
100.0000 mg | ORAL_CAPSULE | Freq: Two times a day (BID) | ORAL | 0 refills | Status: DC
Start: 2020-05-30 — End: 2020-06-23

## 2020-05-30 MED ORDER — NAPROXEN 500 MG PO TABS
500.0000 mg | ORAL_TABLET | Freq: Two times a day (BID) | ORAL | 0 refills | Status: DC
Start: 2020-05-30 — End: 2020-09-23

## 2020-05-30 NOTE — ED Triage Notes (Signed)
Pt c/o abscess to buttocks since Thursday

## 2020-05-30 NOTE — ED Provider Notes (Signed)
[place]   MRN: [id number] DOB: 07-27-1976  Subjective:   Austin Rubio is a 44 y.o. male presenting for 1 week hx of acute onset persistent worsening buttock pain, abscess. Tried APAP with relief only initially. He also took penicillin. Last a1c was 6.0% 10/12/2019.  No current facility-administered medications for this encounter.  Current Outpatient Medications:  .  amLODipine (NORVASC) 10 MG tablet, TAKE 1 TABLET BY MOUTH ONCE PER DAY TO LOWER BLOOD PRESSURE, Disp: 30 tablet, Rfl: 0 .  metFORMIN (GLUCOPHAGE) 500 MG tablet, Take 2 pills after the evening meal (Patient taking differently: Take 1,000 mg by mouth daily with breakfast. ), Disp: 180 tablet, Rfl: 1   No Known Allergies  Past Medical History:  Diagnosis Date  . Complication of anesthesia    syncope after bgetting home from surgery.   . Hyperlipidemia   . Pre-diabetes   . Unspecified essential hypertension 05/17/2013     Past Surgical History:  Procedure Laterality Date  . HERNIA REPAIR    . XI ROBOTIC ASSISTED VENTRAL HERNIA N/A 03/06/2020   Procedure: XI ROBOTIC ASSISTED VENTRAL HERNIA;  Surgeon: Jules Husbands, MD;  Location: [facility] ORS;  Service: General;  Laterality: N/A;    Family History  Problem Relation Age of Onset  . Diabetes Mother   . Heart disease Father     Social History   Tobacco Use  . Smoking status: Never Smoker  . Smokeless tobacco: Never Used  Substance Use Topics  . Alcohol use: No    Comment: socially  . Drug use: No    ROS   Objective:   Vitals: BP (!) 137/73   Pulse 97   Temp 99.8 F (37.7 C) (Oral)   Resp 16   SpO2 99%   Physical Exam Constitutional:      General: He is not in acute distress.    Appearance: Normal appearance. He is well-developed and normal weight. He is not ill-appearing, toxic-appearing or diaphoretic.  HENT:     Head: Normocephalic and atraumatic.     Right Ear: External ear normal.     Left Ear: External ear normal.     Nose:  Nose normal.     Mouth/Throat:     Pharynx: Oropharynx is clear.  Eyes:     General: No scleral icterus.       Right eye: No discharge.        Left eye: No discharge.     Extraocular Movements: Extraocular movements intact.     Pupils: Pupils are equal, round, and reactive to light.  Cardiovascular:     Rate and Rhythm: Normal rate.  Pulmonary:     Effort: Pulmonary effort is normal.  Genitourinary:   Musculoskeletal:     Cervical back: Normal range of motion.  Neurological:     Mental Status: He is alert and oriented to person, place, and time.  Psychiatric:        Mood and Affect: Mood normal.        Behavior: Behavior normal.        Thought Content: Thought content normal.        Judgment: Judgment normal.      Assessment and Plan :   PDMP not reviewed this encounter.  1. Cellulitis of buttock   2. Right buttock pain     There was no area amenable to incision and drainage, no sign of an abscess.  Patient has good control of his diabetes, recommended he start doxycycline to  address his cellulitis, naproxen for pain and inflammation. Counseled patient on potential for adverse effects with medications prescribed/recommended today, strict ER and return-to-clinic precautions discussed, patient verbalized understanding.    Jaynee Eagles, [place] 05/30/20 1956

## 2020-06-03 ENCOUNTER — Other Ambulatory Visit: Payer: Self-pay

## 2020-06-03 ENCOUNTER — Emergency Department (HOSPITAL_COMMUNITY)
Admission: EM | Admit: 2020-06-03 | Discharge: 2020-06-03 | Disposition: A | Discharge status: Home / Self Care | Payer: Self-pay | Attending: Emergency Medicine | Admitting: Emergency Medicine

## 2020-06-03 ENCOUNTER — Other Ambulatory Visit: Payer: Self-pay | Admitting: Family Medicine

## 2020-06-03 ENCOUNTER — Encounter (HOSPITAL_COMMUNITY): Payer: Self-pay | Admitting: Emergency Medicine

## 2020-06-03 DIAGNOSIS — R102 Pelvic and perineal pain: Secondary | ICD-10-CM | POA: Insufficient documentation

## 2020-06-03 DIAGNOSIS — I10 Essential (primary) hypertension: Secondary | ICD-10-CM | POA: Insufficient documentation

## 2020-06-03 DIAGNOSIS — Z7984 Long term (current) use of oral hypoglycemic drugs: Secondary | ICD-10-CM | POA: Insufficient documentation

## 2020-06-03 DIAGNOSIS — L0231 Cutaneous abscess of buttock: Secondary | ICD-10-CM | POA: Insufficient documentation

## 2020-06-03 DIAGNOSIS — R7303 Prediabetes: Secondary | ICD-10-CM | POA: Insufficient documentation

## 2020-06-03 DIAGNOSIS — Z79899 Other long term (current) drug therapy: Secondary | ICD-10-CM | POA: Insufficient documentation

## 2020-06-03 MED ORDER — ONDANSETRON 4 MG PO TBDP
4.0000 mg | ORAL_TABLET | Freq: Once | ORAL | Status: AC
  Administered 2020-06-03: 4 mg via ORAL
  Filled 2020-06-03: qty 1

## 2020-06-03 MED ORDER — HYDROMORPHONE HCL 1 MG/ML IJ SOLN
2.0000 mg | Freq: Once | INTRAMUSCULAR | Status: AC
  Administered 2020-06-03: 2 mg via INTRAVENOUS
  Filled 2020-06-03: qty 2

## 2020-06-03 MED ORDER — LIDOCAINE-EPINEPHRINE 1 %-1:100000 IJ SOLN
20.0000 mL | Freq: Once | INTRAMUSCULAR | Status: AC
  Administered 2020-06-03: 20 mL via INTRADERMAL
  Filled 2020-06-03: qty 1

## 2020-06-03 NOTE — Telephone Encounter (Signed)
Courtesy refill until appointment 06/23/20.

## 2020-06-03 NOTE — ED Provider Notes (Signed)
[place] EMERGENCY DEPARTMENT Provider Note   CSN: [id number] Arrival date & time: 06/03/20  6270     History Chief Complaint  Patient presents with  . Wound Infection    Austin Rubio is a 44 y.o. male who presents emergency department chief complaint of perineal pain.  Patient had onset of pain and swelling 10 days ago.  Since that time he has had worsening pain and swelling in the right right buttock and perineal region.  He states the pain is severe.  He denies pain with defecation.  He has had a similar incident of something like this in his knee several years ago.  He denies fevers or chills.  Patient is diabetic.  He was seen on 730 at urgent care, started on antibiotics and states that his symptoms have been worsening. HPI     Past Medical History:  Diagnosis Date  . Complication of anesthesia    syncope after bgetting home from surgery.   . Hyperlipidemia   . Pre-diabetes   . Unspecified essential hypertension 05/17/2013    Patient Active Problem List   Diagnosis Date Noted  . Erectile dysfunction 07/08/2017  . Hyperlipidemia 07/08/2017  . Hematuria 03/18/2017  . Dyslipidemia 06/22/2013  . Essential hypertension 05/17/2013  . Chronic constipation 05/17/2013  . Hemorrhoids 05/17/2013    Past Surgical History:  Procedure Laterality Date  . HERNIA REPAIR    . XI ROBOTIC ASSISTED VENTRAL HERNIA N/A 03/06/2020   Procedure: XI ROBOTIC ASSISTED VENTRAL HERNIA;  Surgeon: Jules Husbands, MD;  Location: [facility] ORS;  Service: General;  Laterality: N/A;       Family History  Problem Relation Age of Onset  . Diabetes Mother   . Heart disease Father     Social History   Tobacco Use  . Smoking status: Never Smoker  . Smokeless tobacco: Never Used  Substance Use Topics  . Alcohol use: No    Comment: socially  . Drug use: No    Home Medications Prior to Admission medications   Medication Sig Start Date End Date Taking? Authorizing Provider    amLODipine (NORVASC) 10 MG tablet TAKE 1 TABLET BY MOUTH ONCE PER DAY TO LOWER BLOOD PRESSURE 03/25/20   Fulp, Cammie, MD  doxycycline (VIBRAMYCIN) 100 MG capsule Take 1 capsule (100 mg total) by mouth 2 (two) times daily. 05/30/20   Jaynee Eagles, PA-C  metFORMIN (GLUCOPHAGE) 500 MG tablet Take 2 pills after the evening meal Patient taking differently: Take 1,000 mg by mouth daily with breakfast.  10/12/19   Fulp, Cammie, MD  naproxen (NAPROSYN) 500 MG tablet Take 1 tablet (500 mg total) by mouth 2 (two) times daily with a meal. 05/30/20   Jaynee Eagles, PA-C    Allergies    Patient has no known allergies.  Review of Systems   Review of Systems  Ten systems reviewed and are negative for acute change, except as noted in the HPI.   Physical Exam Updated Vital Signs BP 113/74 (BP Location: Right Arm)   Pulse 85   Temp 98.6 F (37 C) (Oral)   Resp 18   SpO2 99%   Physical Exam Vitals and nursing note reviewed. Exam conducted with a chaperone present.  Constitutional:      General: He is not in acute distress.    Appearance: He is well-developed. He is not diaphoretic.  HENT:     Head: Normocephalic and atraumatic.  Eyes:     General: No scleral icterus.  Conjunctiva/sclera: Conjunctivae normal.  Cardiovascular:     Rate and Rhythm: Normal rate and regular rhythm.     Heart sounds: Normal heart sounds.  Pulmonary:     Effort: Pulmonary effort is normal. No respiratory distress.     Breath sounds: Normal breath sounds.  Abdominal:     Palpations: Abdomen is soft.     Tenderness: There is no abdominal tenderness.  Genitourinary:   Musculoskeletal:     Cervical back: Normal range of motion and neck supple.  Skin:    General: Skin is warm and dry.  Neurological:     Mental Status: He is alert.  Psychiatric:        Behavior: Behavior normal.     ED Results / Procedures / Treatments   Labs (all labs ordered are listed, but only abnormal results are displayed) Labs  Reviewed - No data to display  EKG None  Radiology No results found.  Procedures .Marland KitchenIncision and Drainage  Date/Time: 06/03/2020 2:24 PM Performed by: Margarita Mail, PA-C Authorized by: Margarita Mail, PA-C   Consent:    Consent obtained:  Verbal   Consent given by:  Patient   Risks discussed:  Bleeding, incomplete drainage, pain, infection and damage to other organs   Alternatives discussed:  No treatment Location:    Type:  Abscess   Size:  8cm   Location:  Anogenital   Anogenital location:  Gluteal cleft Pre-procedure details:    Skin preparation:  Betadine Procedure type:    Complexity:  Complex Procedure details:    Incision types:  Cruciate   Incision depth:  Dermal   Scalpel blade:  11   Wound management:  Probed and deloculated   Drainage:  Purulent   Drainage amount:  Copious   Wound treatment:  Wound left open   Packing materials:  None Post-procedure details:    Patient tolerance of procedure:  Tolerated well, no immediate complications   (including critical care time)  Medications Ordered in ED Medications - No data to display  ED Course  I have reviewed the triage vital signs and the nursing notes.  Pertinent labs & imaging results that were available during my care of the patient were reviewed by me and considered in my medical decision making (see chart for details).    MDM Rules/Calculators/A&P                          Patient with large gluteal cleft abscess. There is no area of retained pus after procedure. The presentation of Eathan Groman is NOT consistent with necrotizing fascitis or osteomyolitis. There is no evidence of retained foreign body, neurovascular or tendon injury. The presentation of Kijuan Gallicchio is NOT consistent with sepsis and/or bacteremia. [name] meets outpatient criteria for treatment with continued antibiotics as prescribed by UC.   Strict return and follow-up precautions have been given by me  personally or by detailed written instructions verbalized by nursing staff using the teach back method to the patient/family/caregiver(s).  Data Reviewed/Counseling: I have reviewed the patient's vital signs, nursing notes, and other relevant tests/information. I had a detailed discussion regarding the historical points, exam findings, and any diagnostic results supporting the discharge diagnosis. I also discussed the need for outpatient follow-up and the need to return to the ED if symptoms worsen or if there are any questions or concerns that arise at home.  Final Clinical Impression(s) / ED Diagnoses Final diagnoses:  None  Rx / DC Orders ED Discharge Orders    None       Margarita Mail, PA-C 06/03/20 1426    Hayden Rasmussen, MD 06/03/20 [phone]

## 2020-06-03 NOTE — ED Triage Notes (Signed)
Pt reports for over one week he has had pain in right buttocks and went to UC on Friday and was diagnosed with  Cellulitis and started on doxycycline. Pt reports here today because now spot feels like a "hard knot and very sore".

## 2020-06-03 NOTE — Telephone Encounter (Signed)
Using [facility] NR#[id number], patient called and advised he will need an appointment in order to receive medication refills, he verbalized understanding. Appointment scheduled for Monday, 8/23821 at 1550 with Freeman Caldron, [place]. Advised enough medication will be sent to cover until the appointment, he verbalized understanding.

## 2020-06-03 NOTE — Discharge Instructions (Addendum)
Please continue to to take your antibiotics.  Get help right away if: You have severe pain or bleeding. You cannot eat or drink without vomiting. You have decreased urine output. You become short of breath. You have chest pain. You cough up blood. The affected area becomes numb or starts to tingle.

## 2020-06-04 MED FILL — AMLODIPINE BESYLATE 10 MG T: 10 | 20 days supply | Qty: 20 | Fill #0

## 2020-06-12 MED FILL — AMLODIPINE BESYLATE 10 MG T: 10 | 20 days supply | Qty: 20 | Fill #0

## 2020-06-23 ENCOUNTER — Other Ambulatory Visit: Payer: Self-pay | Admitting: Physician Assistant

## 2020-06-23 ENCOUNTER — Encounter: Payer: Self-pay | Admitting: Physician Assistant

## 2020-06-23 ENCOUNTER — Ambulatory Visit: Payer: Self-pay | Attending: Physician Assistant | Admitting: Physician Assistant

## 2020-06-23 DIAGNOSIS — L0231 Cutaneous abscess of buttock: Secondary | ICD-10-CM

## 2020-06-23 DIAGNOSIS — I10 Essential (primary) hypertension: Secondary | ICD-10-CM

## 2020-06-23 DIAGNOSIS — Z09 Encounter for follow-up examination after completed treatment for conditions other than malignant neoplasm: Secondary | ICD-10-CM

## 2020-06-23 DIAGNOSIS — R7303 Prediabetes: Secondary | ICD-10-CM

## 2020-06-23 DIAGNOSIS — L03317 Cellulitis of buttock: Secondary | ICD-10-CM

## 2020-06-23 MED ORDER — METFORMIN HCL 500 MG PO TABS: 1000.0000 mg | ORAL_TABLET | Freq: Every day | ORAL | 1 refills | Status: DC

## 2020-06-23 MED ORDER — AMLODIPINE BESYLATE 10 MG PO TABS: 10.0000 mg | ORAL_TABLET | Freq: Every day | ORAL | 0 refills | Status: DC

## 2020-06-23 MED FILL — ?METFORMIN HCL 500MG TABL: 500 | 30 days supply | Qty: 60 | Fill #0

## 2020-06-23 NOTE — Progress Notes (Signed)
Medical Assistant used [employer] to contact patient.  Interpreter Name: Margarette Canada #: [id number] Patient verified DOB Patient has taken medication today. Patient has eaten today. Patient denies pain at this time.

## 2020-06-23 NOTE — Progress Notes (Signed)
Virtual Visit via Telephone Note  I connected with Austin Rubio on 06/23/20 at  3:50 PM EDT by telephone and verified that I am speaking with the correct person using two identifiers.   I discussed the limitations, risks, security and privacy concerns of performing an evaluation and management service by telephone and the availability of in person appointments. I also discussed with the patient that there may be a patient responsible charge related to this service. The patient expressed understanding and agreed to proceed.  PATIENT visit by telephone virtually in the context of Covid-19 pandemic. Patient location:  home My Location:  [facility] office Persons on the call: me and the patient and Umberto interpreter   History of Present Illness: After being seen in the ED 06/03/2020 for abscess and given Doxy after I&D.  He is doing much better.  No fever.  No tenderness.  Area has healed.  Appetite is good.  Takes his amlodipine but is not taking metformin for months.  Does not check blood sugars. Blood sugar 97-147 at may appts.   From ED notes:   Austin Rubio is a 44 y.o. male who presents emergency department chief complaint of perineal pain.  Patient had onset of pain and swelling 10 days ago.  Since that time he has had worsening pain and swelling in the right right buttock and perineal region.  He states the pain is severe.  He denies pain with defecation.  He has had a similar incident of something like this in his knee several years ago.  He denies fevers or chills.  Patient is diabetic.  He was seen on 730 at urgent care, started on antibiotics and states that his symptoms have been worsening.  From A/P: Patient with large gluteal cleft abscess. There is no area of retained pus after procedure. The presentation of Austin Rubio is NOT consistent with necrotizing fascitis or osteomyolitis. There is no evidence of retained foreign body, neurovascular or tendon injury. The presentation of  Austin Rubio is NOT consistent with sepsis and/or bacteremia. Austin Rubio meets outpatient criteria for treatment with continued antibiotics as prescribed by UC.   Strict return and follow-up precautions have been given by me personally or by detailed written instructions verbalized by nursing staff using the teach back method to the patient/family/caregiver(s).   Observations/Objective:  NAD.  A&Ox3   Assessment and Plan: 1. Essential hypertension - Comprehensive metabolic panel; Future - CBC with Differential/Platelet; Future - amLODipine (NORVASC) 10 MG tablet; Take 1 tablet (10 mg total) by mouth daily.  Dispense: 901 tablet; Refill: 0  2. Prediabetes Educated about and he needs to take this.  Lab appt scheduled for Friday this week.   - CBC with Differential/Platelet; Future - Hemoglobin A1c; Future - metFORMIN (GLUCOPHAGE) 500 MG tablet; Take 2 tablets (1,000 mg total) by mouth daily with breakfast.  Dispense: 180 tablet; Refill: 1 - Lipid panel; Future  3. Encounter for examination following treatment at hospital Doing well  4. Cellulitis and abscess of buttock Resolved/doing well    Follow Up Instructions: See PCP in 2-3 months   I discussed the assessment and treatment plan with the patient. The patient was provided an opportunity to ask questions and all were answered. The patient agreed with the plan and demonstrated an understanding of the instructions.   The patient was advised to call back or seek an in-person evaluation if the symptoms worsen or if the condition fails to improve as anticipated.  I  provided 13 minutes of non-face-to-face time during this encounter.   Freeman Caldron, PA-C  Patient ID: Austin Rubio, male   DOB: Dec 08, 1975, 44 y.o.   MRN: [id number]

## 2020-06-27 ENCOUNTER — Other Ambulatory Visit: Payer: Self-pay

## 2020-06-27 ENCOUNTER — Ambulatory Visit: Payer: Self-pay | Attending: Family Medicine

## 2020-06-27 DIAGNOSIS — R7303 Prediabetes: Secondary | ICD-10-CM

## 2020-06-27 DIAGNOSIS — I10 Essential (primary) hypertension: Secondary | ICD-10-CM

## 2020-06-28 LAB — CBC WITH DIFFERENTIAL/PLATELET
Basophils Absolute: 0 10*3/uL (ref 0.0–0.2)
Basos: 0 %
EOS (ABSOLUTE): 0.2 10*3/uL (ref 0.0–0.4)
Eos: 4 %
Hematocrit: 40.2 % (ref 37.5–51.0)
Hemoglobin: 13.9 g/dL (ref 13.0–17.7)
Immature Grans (Abs): 0 10*3/uL (ref 0.0–0.1)
Immature Granulocytes: 0 %
Lymphocytes Absolute: 2.7 10*3/uL (ref 0.7–3.1)
Lymphs: 45 %
MCH: 30 pg (ref 26.6–33.0)
MCHC: 34.6 g/dL (ref 31.5–35.7)
MCV: 87 fL (ref 79–97)
Monocytes Absolute: 0.5 10*3/uL (ref 0.1–0.9)
Monocytes: 8 %
Neutrophils Absolute: 2.6 10*3/uL (ref 1.4–7.0)
Neutrophils: 43 %
Platelets: 282 10*3/uL (ref 150–450)
RBC: 4.64 x10E6/uL (ref 4.14–5.80)
RDW: 13.2 % (ref 11.6–15.4)
WBC: 6 10*3/uL (ref 3.4–10.8)

## 2020-06-28 LAB — COMPREHENSIVE METABOLIC PANEL
ALT: 31 IU/L (ref 0–44)
AST: 24 IU/L (ref 0–40)
Albumin/Globulin Ratio: 1.8 (ref 1.2–2.2)
Albumin: 4.3 g/dL (ref 4.0–5.0)
Alkaline Phosphatase: 96 IU/L (ref 48–121)
BUN/Creatinine Ratio: 15 (ref 9–20)
BUN: 18 mg/dL (ref 6–24)
Bilirubin Total: 0.3 mg/dL (ref 0.0–1.2)
CO2: 25 mmol/L (ref 20–29)
Calcium: 8.6 mg/dL — ABNORMAL LOW (ref 8.7–10.2)
Chloride: 103 mmol/L (ref 96–106)
Creatinine, Ser: 1.21 mg/dL (ref 0.76–1.27)
GFR calc Af Amer: 83 mL/min/{1.73_m2} (ref >59)
GFR calc non Af Amer: 72 mL/min/{1.73_m2} (ref >59)
Globulin, Total: 2.4 g/dL (ref 1.5–4.5)
Glucose: 87 mg/dL (ref 65–99)
Potassium: 4.1 mmol/L (ref 3.5–5.2)
Sodium: 140 mmol/L (ref 134–144)
Total Protein: 6.7 g/dL (ref 6.0–8.5)

## 2020-06-28 LAB — HEMOGLOBIN A1C
Est. average glucose Bld gHb Est-mCnc: 120 mg/dL
Hgb A1c MFr Bld: 5.8 % — ABNORMAL HIGH (ref 4.8–5.6)

## 2020-06-28 LAB — LIPID PANEL
Chol/HDL Ratio: 4.6 ratio (ref 0.0–5.0)
Cholesterol, Total: 205 mg/dL — ABNORMAL HIGH (ref 100–199)
HDL: 45 mg/dL (ref >39)
LDL Chol Calc (NIH): 135 mg/dL — ABNORMAL HIGH (ref 0–99)
Triglycerides: 138 mg/dL (ref 0–149)
VLDL Cholesterol Cal: 25 mg/dL (ref 5–40)

## 2020-07-02 ENCOUNTER — Other Ambulatory Visit: Payer: Self-pay | Admitting: Physician Assistant

## 2020-07-02 MED ORDER — ATORVASTATIN CALCIUM 10 MG PO TABS
10.0000 mg | ORAL_TABLET | Freq: Every day | ORAL | 3 refills | Status: DC
Start: 2020-07-02 — End: 2020-09-23

## 2020-07-02 MED FILL — ?ATORVASTATIN 10 MG TABLET: 10 | 30 days supply | Qty: 30 | Fill #0

## 2020-08-27 ENCOUNTER — Other Ambulatory Visit: Payer: Self-pay

## 2020-08-27 ENCOUNTER — Ambulatory Visit: Payer: Self-pay | Attending: Family Medicine

## 2020-09-23 ENCOUNTER — Other Ambulatory Visit: Payer: Self-pay

## 2020-09-23 ENCOUNTER — Other Ambulatory Visit: Payer: Self-pay | Admitting: Nurse Practitioner

## 2020-09-23 ENCOUNTER — Encounter: Payer: Self-pay | Admitting: Nurse Practitioner

## 2020-09-23 ENCOUNTER — Ambulatory Visit: Payer: Self-pay | Attending: Nurse Practitioner | Admitting: Nurse Practitioner

## 2020-09-23 VITALS — BP 156/89 | HR 69 | Temp 96.8°F | Ht 65.0 in | Wt 206.8 lb

## 2020-09-23 DIAGNOSIS — Z23 Encounter for immunization: Secondary | ICD-10-CM

## 2020-09-23 DIAGNOSIS — I10 Essential (primary) hypertension: Secondary | ICD-10-CM

## 2020-09-23 DIAGNOSIS — R7303 Prediabetes: Secondary | ICD-10-CM

## 2020-09-23 DIAGNOSIS — M542 Cervicalgia: Secondary | ICD-10-CM

## 2020-09-23 DIAGNOSIS — E785 Hyperlipidemia, unspecified: Secondary | ICD-10-CM

## 2020-09-23 LAB — GLUCOSE, POCT (MANUAL RESULT ENTRY): POC Glucose: 102 mg/dl — AB (ref 70–99)

## 2020-09-23 MED ORDER — NAPROXEN 500 MG PO TABS: 500.0000 mg | ORAL_TABLET | Freq: Two times a day (BID) | ORAL | 1 refills | Status: DC | PRN

## 2020-09-23 MED ORDER — ATORVASTATIN CALCIUM 10 MG PO TABS: 10.0000 mg | ORAL_TABLET | Freq: Every day | ORAL | 3 refills | Status: DC

## 2020-09-23 MED ORDER — AMLODIPINE BESYLATE 10 MG PO TABS: 10.0000 mg | ORAL_TABLET | Freq: Every day | ORAL | 1 refills | Status: DC

## 2020-09-23 MED FILL — ?NAPROXEN 500 MG TABS: 500 | 30 days supply | Qty: 60 | Fill #0

## 2020-09-23 MED FILL — ?ATORVASTATIN 10 MG TABLET: 10 | 30 days supply | Qty: 30 | Fill #0

## 2020-09-23 MED FILL — AMLODIPINE BESYLATE 10 MG T: 10 | 30 days supply | Qty: 30 | Fill #0

## 2020-09-23 NOTE — Progress Notes (Signed)
Assessment & Plan:  Austin Rubio was seen today for follow-up.  Diagnoses and all orders for this visit:  Essential hypertension -     amLODipine (NORVASC) 10 MG tablet; Take 1 tablet (10 mg total) by mouth daily. Continue all antihypertensives as prescribed.  Remember to bring in your blood pressure log with you for your follow up appointment.  DASH/Mediterranean Diets are healthier choices for HTN.    Prediabetes -     Glucose (CBG)  Dyslipidemia -     atorvastatin (LIPITOR) 10 MG tablet; Take 1 tablet (10 mg total) by mouth daily. INSTRUCTIONS: Work on a low fat, heart healthy diet and participate in regular aerobic exercise program by working out at least 150 minutes per week; 5 days a week-30 minutes per day. Avoid red meat/beef/steak,  fried foods. junk foods, sodas, sugary drinks, unhealthy snacking, alcohol and smoking.  Drink at least 80 oz of water per day and monitor your carbohydrate intake daily.    Neck pain -     naproxen (NAPROSYN) 500 MG tablet; Take 1 tablet (500 mg total) by mouth 2 (two) times daily as needed.    Patient has been counseled on age-appropriate routine health concerns for screening and prevention. These are reviewed and up-to-date. Referrals have been placed accordingly. Immunizations are up-to-date or declined.    Subjective:   Chief Complaint  Patient presents with  . Follow-up    Pt. is here for 3 months F.U on blood pressure.    HPI Austin Rubio 44 y.o. male presents to office today for HTN F/U PMH: Hyperlipidemia, Pre-diabetes, and HTN  Essential Hypertension He has been out of his blood pressure medication for several weeks. Was not aware he had refills available. He does not monitor his blood pressure at home. Denies chest pain, shortness of breath, palpitations, lightheadedness, dizziness, headaches or BLE edema.  BP Readings from Last 3 Encounters:  09/23/20 (!) 156/89  06/03/20 120/70  05/30/20 (!) 137/73    Prediabetes Controlled with metformin 1000 mg daily. Denies any hyper or hypoglycemic symptoms.  Lab Results  Component Value Date   HGBA1C 5.8 (H) 06/27/2020   Endorses stiff neck when he sleeps the wrong way. It was hurting 3 days ago but not today.     Review of Systems  Constitutional: Negative for fever, malaise/fatigue and weight loss.  HENT: Negative.  Negative for nosebleeds.   Eyes: Negative.  Negative for blurred vision, double vision and photophobia.  Respiratory: Negative.  Negative for cough and shortness of breath.   Cardiovascular: Negative.  Negative for chest pain, palpitations and leg swelling.  Gastrointestinal: Negative.  Negative for heartburn, nausea and vomiting.  Genitourinary: Negative.   Musculoskeletal: Negative.  Negative for myalgias.  Skin: Negative.   Neurological: Negative.  Negative for dizziness, focal weakness, seizures and headaches.  Endo/Heme/Allergies: Negative.   Psychiatric/Behavioral: Negative.  Negative for suicidal ideas.    Past Medical History:  Diagnosis Date  . Complication of anesthesia    syncope after bgetting home from surgery.   . Hyperlipidemia   . Pre-diabetes   . Unspecified essential hypertension 05/17/2013    Past Surgical History:  Procedure Laterality Date  . HERNIA REPAIR    . XI ROBOTIC ASSISTED VENTRAL HERNIA N/A 03/06/2020   Procedure: XI ROBOTIC ASSISTED VENTRAL HERNIA;  Surgeon: Jules Husbands, MD;  Location: [facility] ORS;  Service: General;  Laterality: N/A;    Family History  Problem Relation Age of Onset  . Diabetes Mother   .  Heart disease Father     Social History Reviewed with no changes to be made today.   Outpatient Medications Prior to Visit  Medication Sig Dispense Refill  . metFORMIN (GLUCOPHAGE) 500 MG tablet Take 2 tablets (1,000 mg total) by mouth daily with breakfast. 180 tablet 1  . amLODipine (NORVASC) 10 MG tablet Take 1 tablet (10 mg total) by mouth daily. 901 tablet 0  .  atorvastatin (LIPITOR) 10 MG tablet Take 1 tablet (10 mg total) by mouth daily. 90 tablet 3  . naproxen (NAPROSYN) 500 MG tablet Take 1 tablet (500 mg total) by mouth 2 (two) times daily with a meal. 30 tablet 0   No facility-administered medications prior to visit.    No Known Allergies     Objective:    BP (!) 156/89 (BP Location: Left Arm, Patient Position: Sitting, Cuff Size: Normal)   Pulse 69   Temp (!) 96.8 F (36 C) (Temporal)   Ht 5\' 5"  (1.651 m)   Wt 206 lb 12.8 oz (93.8 kg)   SpO2 97%   BMI 34.41 kg/m  Wt Readings from Last 3 Encounters:  09/23/20 206 lb 12.8 oz (93.8 kg)  04/16/20 200 lb 6.4 oz (90.9 kg)  03/26/20 195 lb 6.4 oz (88.6 kg)    Physical Exam Vitals and nursing note reviewed.  Constitutional:      Appearance: He is well-developed.  HENT:     Head: Normocephalic and atraumatic.     Right Ear: Hearing, tympanic membrane, ear canal and external ear normal.     Left Ear: Hearing, tympanic membrane, ear canal and external ear normal.     Nose: Nose normal. No mucosal edema or rhinorrhea.     Mouth/Throat:     Pharynx: Uvula midline.     Tonsils: No tonsillar exudate. 1+ on the right. 1+ on the left.  Eyes:     General: Lids are normal. No scleral icterus.    Extraocular Movements: Extraocular movements intact.     Conjunctiva/sclera: Conjunctivae normal.     Pupils: Pupils are equal, round, and reactive to light.  Neck:     Thyroid: No thyromegaly.     Trachea: No tracheal deviation.  Cardiovascular:     Rate and Rhythm: Normal rate and regular rhythm.     Heart sounds: Normal heart sounds. No murmur heard.  No friction rub. No gallop.   Pulmonary:     Effort: Pulmonary effort is normal. No tachypnea or respiratory distress.     Breath sounds: Normal breath sounds. No decreased breath sounds, wheezing, rhonchi or rales.  Chest:     Chest wall: No mass or tenderness.     Breasts:        Right: No inverted nipple, mass, nipple discharge, skin  change or tenderness.        Left: No inverted nipple, mass, nipple discharge, skin change or tenderness.  Abdominal:     General: Bowel sounds are normal. There is no distension.     Palpations: Abdomen is soft. There is no mass.     Tenderness: There is no abdominal tenderness. There is no guarding or rebound.  Musculoskeletal:        General: No tenderness or deformity. Normal range of motion.     Cervical back: Normal range of motion and neck supple.  Lymphadenopathy:     Cervical: No cervical adenopathy.  Skin:    General: Skin is warm and dry.     Capillary Refill: Capillary refill  takes less than 2 seconds.     Findings: No erythema.  Neurological:     Mental Status: He is alert and oriented to person, place, and time.     Cranial Nerves: Cranial nerves are intact. No cranial nerve deficit.     Sensory: Sensation is intact.     Motor: Motor function is intact. No abnormal muscle tone.     Coordination: Coordination is intact. Coordination normal.     Gait: Gait is intact.     Deep Tendon Reflexes: Reflexes normal.     Reflex Scores:      Patellar reflexes are 1+ on the right side and 1+ on the left side. Psychiatric:        Behavior: Behavior normal. Behavior is cooperative.        Thought Content: Thought content normal.        Judgment: Judgment normal.          Patient has been counseled extensively about nutrition and exercise as well as the importance of adherence with medications and regular follow-up. The patient was given clear instructions to go to ER or return to medical center if symptoms don't improve, worsen or new problems develop. The patient verbalized understanding.   Follow-up: Return in about 3 months (around 12/24/2020).   Gildardo Pounds, FNP-BC [facility] and [facility], [place]   09/23/2020, 4:15 PM

## 2020-10-29 MED FILL — ?ATORVASTATIN 10 MG TABLET: 10 | 30 days supply | Qty: 30 | Fill #1

## 2020-10-29 MED FILL — METFORMIN HCL 500 MG TABS: 500 | 30 days supply | Qty: 60 | Fill #1

## 2020-11-03 MED FILL — AMLODIPINE BESYLATE 10 MG T: 10 | 30 days supply | Qty: 30 | Fill #1

## 2020-12-16 MED FILL — ?ATORVASTATIN 10 MG TABLET: 10 | 30 days supply | Qty: 30 | Fill #2

## 2020-12-16 MED FILL — AMLODIPINE BESYLATE 10 MG T: 10 | 30 days supply | Qty: 30 | Fill #2

## 2020-12-24 ENCOUNTER — Ambulatory Visit: Payer: Self-pay | Admitting: Nurse Practitioner

## 2021-02-02 ENCOUNTER — Other Ambulatory Visit: Payer: Self-pay

## 2021-02-02 MED FILL — Amlodipine Besylate Tab 10 MG (Base Equivalent): ORAL | 30 days supply | Qty: 30 | Fill #0 | Status: AC

## 2021-02-02 MED FILL — Atorvastatin Calcium Tab 10 MG (Base Equivalent): ORAL | 30 days supply | Qty: 30 | Fill #0 | Status: AC

## 2021-02-03 ENCOUNTER — Other Ambulatory Visit: Payer: Self-pay

## 2021-02-27 ENCOUNTER — Other Ambulatory Visit: Payer: Self-pay

## 2021-02-27 ENCOUNTER — Ambulatory Visit: Payer: Self-pay | Attending: Nurse Practitioner | Admitting: Nurse Practitioner

## 2021-02-27 ENCOUNTER — Ambulatory Visit: Payer: Self-pay

## 2021-02-27 ENCOUNTER — Encounter: Payer: Self-pay | Admitting: Nurse Practitioner

## 2021-02-27 VITALS — BP 144/88 | HR 71 | Resp 17 | Ht 64.0 in | Wt 208.8 lb

## 2021-02-27 DIAGNOSIS — G8929 Other chronic pain: Secondary | ICD-10-CM

## 2021-02-27 DIAGNOSIS — R7303 Prediabetes: Secondary | ICD-10-CM

## 2021-02-27 DIAGNOSIS — M25561 Pain in right knee: Secondary | ICD-10-CM

## 2021-02-27 DIAGNOSIS — I10 Essential (primary) hypertension: Secondary | ICD-10-CM

## 2021-02-27 DIAGNOSIS — E785 Hyperlipidemia, unspecified: Secondary | ICD-10-CM

## 2021-02-27 DIAGNOSIS — Z1211 Encounter for screening for malignant neoplasm of colon: Secondary | ICD-10-CM

## 2021-02-27 DIAGNOSIS — Z1159 Encounter for screening for other viral diseases: Secondary | ICD-10-CM

## 2021-02-27 DIAGNOSIS — R222 Localized swelling, mass and lump, trunk: Secondary | ICD-10-CM

## 2021-02-27 DIAGNOSIS — Z130 Encounter for screening for diseases of the blood and blood-forming organs and certain disorders involving the immune mechanism: Secondary | ICD-10-CM

## 2021-02-27 LAB — POCT GLYCOSYLATED HEMOGLOBIN (HGB A1C): HbA1c, POC (controlled diabetic range): 5.9 % (ref 0.0–7.0)

## 2021-02-27 MED ORDER — ATORVASTATIN CALCIUM 10 MG PO TABS
ORAL_TABLET | Freq: Every day | ORAL | 3 refills | Status: DC
  Filled 2021-02-27: qty 90, 90d supply, fill #0

## 2021-02-27 MED ORDER — AMLODIPINE BESYLATE 10 MG PO TABS
ORAL_TABLET | Freq: Every day | ORAL | 1 refills | Status: DC
  Filled 2021-02-27: qty 90, 90d supply, fill #0

## 2021-02-27 MED ORDER — NAPROXEN 500 MG PO TABS
500.0000 mg | ORAL_TABLET | Freq: Two times a day (BID) | ORAL | 1 refills | Status: DC | PRN
  Filled 2021-02-27: qty 60, 30d supply, fill #0

## 2021-02-27 NOTE — Progress Notes (Signed)
Assessment & Plan:  Austin Rubio was seen today for hypertension.  Diagnoses and all orders for this visit:  Nodule of left anterior chest wall -     [place] CHEST SOFT TISSUE; Future  Essential hypertension -     CMP14+EGFR -     amLODipine (NORVASC) 10 MG tablet; TAKE 1 TABLET (10 MG TOTAL) BY MOUTH DAILY. Continue all antihypertensives as prescribed.  Remember to bring in your blood pressure log with you for your follow up appointment.  DASH/Mediterranean Diets are healthier choices for HTN.    Dyslipidemia -     Lipid panel -     atorvastatin (LIPITOR) 10 MG tablet; TAKE 1 TABLET (10 MG TOTAL) BY MOUTH DAILY. INSTRUCTIONS: Work on a low fat, heart healthy diet and participate in regular aerobic exercise program by working out at least 150 minutes per week; 5 days a week-30 minutes per day. Avoid red meat/beef/steak,  fried foods. junk foods, sodas, sugary drinks, unhealthy snacking, alcohol and smoking.  Drink at least 80 oz of water per day and monitor your carbohydrate intake daily.    Chronic pain of right knee -     naproxen (NAPROSYN) 500 MG tablet; Take 1 tablet (500 mg total) by mouth 2 (two) times daily as needed. Work on losing weight to help reduce joint pain. May alternate with heat and ice application for pain relief. May also alternate with acetaminophen as prescribed pain relief. Other alternatives include massage, acupuncture and water aerobics.  You must stay active and avoid a sedentary lifestyle.  Prediabetes -     CMP14+EGFR -     POCT glycosylated hemoglobin (Hb A1C)  Colon cancer screening -     Fecal occult blood, imunochemical([facility])  Screening for deficiency anemia -     CBC  Need for hepatitis C screening test -     HCV Ab w Reflex to Quant PCR    Patient has been counseled on age-appropriate routine health concerns for screening and prevention. These are reviewed and up-to-date. Referrals have been placed accordingly. Immunizations are  up-to-date or declined.    Subjective:   Chief Complaint  Patient presents with  . Hypertension   HPI Austin Rubio 45 y.o. male presents to office today for follow up. He has a past medical history of Hyperlipidemia, Pre-diabetes, and HTN (2014)  VRI was used to communicate directly with patient for the entire encounter including providing detailed patient instructions.    Chest wall mass 2-3cm Subcutaneous slightly fixed nodule on upper left chest. He noticed it 6 years ago. It has now increased in size.    Knee Pain Endorses chronic right knee pain with onset over 1 year ago around the same time as his hernia surgery.  Pain is worse when in a kneeling position and when he initially stands up from a kneeling position. Lasts for a few minutes and then resolves on its own. Wears a knee sleeve which does help with pain. Pain is intermittent.   Prediabetes He is currently not taking metformin. Diet controlled. LDL not at goal with atorvastatin 38m daily.  Lab Results  Component Value Date   HGBA1C 5.9 02/27/2021   Lab Results  Component Value Date   LDLCALC 135 (H) 06/27/2020   Review of Systems  Constitutional: Negative for fever, malaise/fatigue and weight loss.  HENT: Negative.  Negative for nosebleeds.   Eyes: Negative.  Negative for blurred vision, double vision and photophobia.  Respiratory: Negative.  Negative for cough and shortness  of breath.   Cardiovascular: Negative.  Negative for chest pain, palpitations and leg swelling.  Gastrointestinal: Negative.  Negative for heartburn, nausea and vomiting.  Musculoskeletal: Positive for joint pain. Negative for myalgias.  Neurological: Negative.  Negative for dizziness, focal weakness, seizures and headaches.  Psychiatric/Behavioral: Negative.  Negative for suicidal ideas.    Past Medical History:  Diagnosis Date  . Complication of anesthesia    syncope after bgetting home from surgery.   . Hyperlipidemia   .  Pre-diabetes   . Unspecified essential hypertension 05/17/2013    Past Surgical History:  Procedure Laterality Date  . HERNIA REPAIR    . XI ROBOTIC ASSISTED VENTRAL HERNIA N/A 03/06/2020   Procedure: XI ROBOTIC ASSISTED VENTRAL HERNIA;  Surgeon: Jules Husbands, MD;  Location: [facility] ORS;  Service: General;  Laterality: N/A;    Family History  Problem Relation Age of Onset  . Diabetes Mother   . Heart disease Father     Social History Reviewed with no changes to be made today.   Outpatient Medications Prior to Visit  Medication Sig Dispense Refill  . metFORMIN (GLUCOPHAGE) 500 MG tablet TAKE 2 TABLETS (1,000 MG TOTAL) BY MOUTH DAILY WITH BREAKFAST. 180 tablet 1  . amLODipine (NORVASC) 10 MG tablet TAKE 1 TABLET (10 MG TOTAL) BY MOUTH DAILY. 90 tablet 1  . atorvastatin (LIPITOR) 10 MG tablet TAKE 1 TABLET (10 MG TOTAL) BY MOUTH DAILY. 90 tablet 3  . naproxen (NAPROSYN) 500 MG tablet Take 1 tablet (500 mg total) by mouth 2 (two) times daily as needed. 60 tablet 1   No facility-administered medications prior to visit.    No Known Allergies     Objective:    BP (!) 144/88   Pulse 71   Resp 17   Ht 5' 4"  (1.626 m)   Wt 208 lb 12.8 oz (94.7 kg)   SpO2 95%   BMI 35.84 kg/m  Wt Readings from Last 3 Encounters:  02/27/21 208 lb 12.8 oz (94.7 kg)  09/23/20 206 lb 12.8 oz (93.8 kg)  04/16/20 200 lb 6.4 oz (90.9 kg)    Physical Exam Vitals and nursing note reviewed.  Constitutional:      Appearance: He is well-developed.  HENT:     Head: Normocephalic and atraumatic.  Cardiovascular:     Rate and Rhythm: Normal rate and regular rhythm.     Heart sounds: Normal heart sounds. No murmur heard. No friction rub. No gallop.   Pulmonary:     Effort: Pulmonary effort is normal. No tachypnea or respiratory distress.     Breath sounds: Normal breath sounds. No decreased breath sounds, wheezing, rhonchi or rales.  Chest:     Chest wall: No tenderness.  Abdominal:     General:  Bowel sounds are normal.     Palpations: Abdomen is soft.  Musculoskeletal:        General: Normal range of motion.     Cervical back: Normal range of motion.     Right knee: Normal. No swelling, deformity or bony tenderness. Normal range of motion. No tenderness.     Left knee: Normal. No swelling, deformity or bony tenderness. Normal range of motion. No tenderness.  Skin:    General: Skin is warm and dry.  Neurological:     Mental Status: He is alert and oriented to person, place, and time.     Coordination: Coordination normal.  Psychiatric:        Behavior: Behavior normal. Behavior is cooperative.  Thought Content: Thought content normal.        Judgment: Judgment normal.          Patient has been counseled extensively about nutrition and exercise as well as the importance of adherence with medications and regular follow-up. The patient was given clear instructions to go to ER or return to medical center if symptoms don't improve, worsen or new problems develop. The patient verbalized understanding.   Follow-up: Return in about 3 months (around 05/29/2021).   Gildardo Pounds, FNP-BC [facility] [place], [place]   02/27/2021, 8:56 PM

## 2021-02-27 NOTE — Progress Notes (Signed)
Pt also states having lump on chest with no pain noted, and occasional right knee pain.

## 2021-02-28 LAB — HCV INTERPRETATION

## 2021-02-28 LAB — CMP14+EGFR
ALT: 46 IU/L — ABNORMAL HIGH (ref 0–44)
AST: 29 IU/L (ref 0–40)
Albumin/Globulin Ratio: 1.6 (ref 1.2–2.2)
Albumin: 4.6 g/dL (ref 4.0–5.0)
Alkaline Phosphatase: 87 IU/L (ref 44–121)
BUN/Creatinine Ratio: 22 — ABNORMAL HIGH (ref 9–20)
BUN: 16 mg/dL (ref 6–24)
Bilirubin Total: 0.4 mg/dL (ref 0.0–1.2)
CO2: 23 mmol/L (ref 20–29)
Calcium: 9.2 mg/dL (ref 8.7–10.2)
Chloride: 98 mmol/L (ref 96–106)
Creatinine, Ser: 0.72 mg/dL — ABNORMAL LOW (ref 0.76–1.27)
Globulin, Total: 2.8 g/dL (ref 1.5–4.5)
Glucose: 104 mg/dL — ABNORMAL HIGH (ref 65–99)
Potassium: 4 mmol/L (ref 3.5–5.2)
Sodium: 137 mmol/L (ref 134–144)
Total Protein: 7.4 g/dL (ref 6.0–8.5)
eGFR: 114 mL/min/{1.73_m2} (ref >59)

## 2021-02-28 LAB — CBC
Hematocrit: 46.1 % (ref 37.5–51.0)
Hemoglobin: 15.4 g/dL (ref 13.0–17.7)
MCH: 29.2 pg (ref 26.6–33.0)
MCHC: 33.4 g/dL (ref 31.5–35.7)
MCV: 88 fL (ref 79–97)
Platelets: 291 10*3/uL (ref 150–450)
RBC: 5.27 x10E6/uL (ref 4.14–5.80)
RDW: 12.5 % (ref 11.6–15.4)
WBC: 6.9 10*3/uL (ref 3.4–10.8)

## 2021-02-28 LAB — LIPID PANEL
Chol/HDL Ratio: 4 ratio (ref 0.0–5.0)
Cholesterol, Total: 180 mg/dL (ref 100–199)
HDL: 45 mg/dL (ref >39)
LDL Chol Calc (NIH): 112 mg/dL — ABNORMAL HIGH (ref 0–99)
Triglycerides: 131 mg/dL (ref 0–149)
VLDL Cholesterol Cal: 23 mg/dL (ref 5–40)

## 2021-02-28 LAB — HCV AB W REFLEX TO QUANT PCR: HCV Ab: <0.1 s/co ratio (ref 0.0–0.9)

## 2021-03-06 ENCOUNTER — Ambulatory Visit (HOSPITAL_COMMUNITY)
Admission: RE | Admit: 2021-03-06 | Discharge: 2021-03-06 | Disposition: A | Discharge status: Home / Self Care | Payer: Self-pay | Source: Ambulatory Visit | Attending: Nurse Practitioner | Admitting: Nurse Practitioner

## 2021-03-06 ENCOUNTER — Other Ambulatory Visit: Payer: Self-pay

## 2021-03-06 ENCOUNTER — Telehealth: Payer: Self-pay | Admitting: Nurse Practitioner

## 2021-03-06 DIAGNOSIS — R222 Localized swelling, mass and lump, trunk: Secondary | ICD-10-CM | POA: Insufficient documentation

## 2021-03-06 NOTE — Telephone Encounter (Signed)
Called to speak with the doctor or nurse regarding some critical findings on the ultrasound of patient.  Called for the nurse but no one answered 2x.  Please call to get the results at [phone]

## 2021-03-06 NOTE — Telephone Encounter (Signed)
Returned call and spoke to Austin Rubio just wanted to make sure that provider is able to see results ultrasound results and reccomendations   IMPRESSION: Solid indeterminate mass in the 11 o'clock location of the LEFT breast.   Recommend bilateral diagnostic mammogram and ultrasound-guided core biopsy of the LEFT breast.

## 2021-03-09 ENCOUNTER — Other Ambulatory Visit: Payer: Self-pay | Admitting: Nurse Practitioner

## 2021-03-09 DIAGNOSIS — R928 Other abnormal and inconclusive findings on diagnostic imaging of breast: Secondary | ICD-10-CM

## 2021-03-09 NOTE — Telephone Encounter (Signed)
Yes thank you. I have sent referral to [facility] as well as he is uninsured. I also sent a nurse note for the nurse to call him regarding his results. I have been out of the office since May 2nd and returning tomorrow May 10th. Austin Rubio was covering during my absence.

## 2021-03-09 NOTE — Telephone Encounter (Signed)
Hi Paula,  Can you please contact this patient regarding scheduling for diagnostic mammogram and scholarship program.

## 2021-03-10 ENCOUNTER — Other Ambulatory Visit: Payer: Self-pay | Admitting: Nurse Practitioner

## 2021-03-10 DIAGNOSIS — R928 Other abnormal and inconclusive findings on diagnostic imaging of breast: Secondary | ICD-10-CM

## 2021-03-10 DIAGNOSIS — N63 Unspecified lump in unspecified breast: Secondary | ICD-10-CM

## 2021-04-15 ENCOUNTER — Other Ambulatory Visit: Payer: Self-pay

## 2021-05-28 ENCOUNTER — Ambulatory Visit: Payer: Self-pay | Admitting: Family

## 2021-05-29 ENCOUNTER — Ambulatory Visit: Payer: Self-pay | Admitting: Nurse Practitioner

## 2021-07-14 ENCOUNTER — Other Ambulatory Visit: Payer: Self-pay

## 2021-07-14 ENCOUNTER — Ambulatory Visit: Payer: Self-pay | Attending: Nurse Practitioner | Admitting: Nurse Practitioner

## 2021-07-14 VITALS — BP 148/83 | HR 77 | Ht 64.0 in | Wt 213.0 lb

## 2021-07-14 DIAGNOSIS — R7303 Prediabetes: Secondary | ICD-10-CM

## 2021-07-14 DIAGNOSIS — I10 Essential (primary) hypertension: Secondary | ICD-10-CM

## 2021-07-14 DIAGNOSIS — E785 Hyperlipidemia, unspecified: Secondary | ICD-10-CM

## 2021-07-14 DIAGNOSIS — R7989 Other specified abnormal findings of blood chemistry: Secondary | ICD-10-CM

## 2021-07-14 DIAGNOSIS — Z23 Encounter for immunization: Secondary | ICD-10-CM

## 2021-07-14 MED ORDER — METFORMIN HCL 500 MG PO TABS
ORAL_TABLET | Freq: Every day | ORAL | 1 refills | Status: DC
Start: 2021-07-14 — End: 2021-10-02
  Filled 2021-07-14: qty 180, 90d supply, fill #0

## 2021-07-14 MED ORDER — ATORVASTATIN CALCIUM 10 MG PO TABS
ORAL_TABLET | Freq: Every day | ORAL | 3 refills | Status: DC
  Filled 2021-07-14: qty 90, 90d supply, fill #0

## 2021-07-14 MED ORDER — AMLODIPINE BESYLATE 10 MG PO TABS
ORAL_TABLET | Freq: Every day | ORAL | 1 refills | Status: DC
  Filled 2021-07-14: qty 90, 90d supply, fill #0

## 2021-07-14 MED ORDER — LOSARTAN POTASSIUM 25 MG PO TABS
12.5000 mg | ORAL_TABLET | Freq: Every day | ORAL | 1 refills | Status: DC
  Filled 2021-07-14: qty 15, 30d supply, fill #0

## 2021-07-14 NOTE — Progress Notes (Signed)
Austin Rubio [id number]

## 2021-07-14 NOTE — Progress Notes (Signed)
Assessment & Plan:  Austin Rubio was seen today for hypertension.  Diagnoses and all orders for this visit:  Essential hypertension -     CMP14+EGFR -     amLODipine (NORVASC) 10 MG tablet; TAKE 1 TABLET (10 MG TOTAL) BY MOUTH DAILY. -     losartan (COZAAR) 25 MG tablet; Take 0.5 tablets (12.5 mg total) by mouth daily. Continue all antihypertensives as prescribed.  Remember to bring in your blood pressure log with you for your follow up appointment.  DASH/Mediterranean Diets are healthier choices for HTN.    Prediabetes -     Hemoglobin A1c -     metFORMIN (GLUCOPHAGE) 500 MG tablet; TAKE 2 TABLETS (1,000 MG TOTAL) BY MOUTH DAILY WITH BREAKFAST.  Dyslipidemia -     atorvastatin (LIPITOR) 10 MG tablet; TAKE 1 TABLET (10 MG TOTAL) BY MOUTH DAILY. INSTRUCTIONS: Work on a low fat, heart healthy diet and participate in regular aerobic exercise program by working out at least 150 minutes per week; 5 days a week-30 minutes per day. Avoid red meat/beef/steak,  fried foods. junk foods, sodas, sugary drinks, unhealthy snacking, alcohol and smoking.  Drink at least 80 oz of water per day and monitor your carbohydrate intake daily.    Abnormal CBC -     CBC with Differential  Need for immunization against influenza -     Flu Vaccine QUAD 22moIM (Fluarix, Fluzone & Alfiuria Quad PF)   Patient has been counseled on age-appropriate routine health concerns for screening and prevention. These are reviewed and up-to-date. Referrals have been placed accordingly. Immunizations are up-to-date or declined.    Subjective:   Chief Complaint  Patient presents with   Hypertension   HPI Austin Kaminsky89y.o. male presents to office today for HTN VRI was used to communicate directly with patient for the entire encounter including providing detailed patient instructions.    HTN Blood pressure is not well controlled. Will add losartan 235mto his current medication amlodipine 10 mg daily. Denies chest pain,  shortness of breath, palpitations, lightheadedness, dizziness, headaches or BLE edema.   BP Readings from Last 3 Encounters:  07/14/21 (!) 148/83  02/27/21 (!) 144/88  09/23/20 (!) 156/89    He has a palpable fixed mass on the left breast. Unfortunately it is Not covered with orange card or the Breast clinic. He was instructed to pay several hundreds of dollars by the breast center for a mammogram.   Prediabetes Well controlled with metformin 500 mg daily. LDL not at goal with atorvastatin 10 mg daily.  Lab Results  Component Value Date   HGBA1C 6.1 (H) 07/14/2021   Lab Results  Component Value Date   LDLCALC 112 (H) 02/27/2021      Review of Systems  Constitutional:  Negative for fever, malaise/fatigue and weight loss.  HENT: Negative.  Negative for nosebleeds.   Eyes: Negative.  Negative for blurred vision, double vision and photophobia.  Respiratory: Negative.  Negative for cough and shortness of breath.   Cardiovascular: Negative.  Negative for chest pain, palpitations and leg swelling.  Gastrointestinal: Negative.  Negative for heartburn, nausea and vomiting.  Musculoskeletal: Negative.  Negative for myalgias.  Neurological: Negative.  Negative for dizziness, focal weakness, seizures and headaches.  Psychiatric/Behavioral: Negative.  Negative for suicidal ideas.    Past Medical History:  Diagnosis Date   Complication of anesthesia    syncope after bgetting home from surgery.    Hyperlipidemia    Pre-diabetes    Unspecified  essential hypertension 05/17/2013    Past Surgical History:  Procedure Laterality Date   HERNIA REPAIR     XI ROBOTIC ASSISTED VENTRAL HERNIA N/A 03/06/2020   Procedure: XI ROBOTIC ASSISTED VENTRAL HERNIA;  Surgeon: Jules Husbands, MD;  Location: [facility] ORS;  Service: General;  Laterality: N/A;    Family History  Problem Relation Age of Onset   Diabetes Mother    Heart disease Father     Social History Reviewed with no changes to be made today.    Outpatient Medications Prior to Visit  Medication Sig Dispense Refill   amLODipine (NORVASC) 10 MG tablet TAKE 1 TABLET (10 MG TOTAL) BY MOUTH DAILY. 90 tablet 1   atorvastatin (LIPITOR) 10 MG tablet TAKE 1 TABLET (10 MG TOTAL) BY MOUTH DAILY. 90 tablet 3   metFORMIN (GLUCOPHAGE) 500 MG tablet TAKE 2 TABLETS (1,000 MG TOTAL) BY MOUTH DAILY WITH BREAKFAST. 180 tablet 1   naproxen (NAPROSYN) 500 MG tablet Take 1 tablet (500 mg total) by mouth 2 (two) times daily as needed. (Patient not taking: Reported on 07/14/2021) 60 tablet 1   No facility-administered medications prior to visit.    No Known Allergies     Objective:    BP (!) 148/83   Pulse 77   Ht _0  (1.626 m)   Wt 213 lb (96.6 kg)   SpO2 95%   BMI 36.56 kg/m  Wt Readings from Last 3 Encounters:  07/14/21 213 lb (96.6 kg)  02/27/21 208 lb 12.8 oz (94.7 kg)  09/23/20 206 lb 12.8 oz (93.8 kg)    Physical Exam Vitals and nursing note reviewed.  Constitutional:      Appearance: He is well-developed.  HENT:     Head: Normocephalic and atraumatic.  Cardiovascular:     Rate and Rhythm: Normal rate and regular rhythm.     Heart sounds: Normal heart sounds. No murmur heard.   No friction rub. No gallop.  Pulmonary:     Effort: Pulmonary effort is normal. No tachypnea or respiratory distress.     Breath sounds: Normal breath sounds. No decreased breath sounds, wheezing, rhonchi or rales.  Chest:     Chest wall: Mass present. No tenderness.    Abdominal:     General: Bowel sounds are normal.     Palpations: Abdomen is soft.  Musculoskeletal:        General: Normal range of motion.     Cervical back: Normal range of motion.  Skin:    General: Skin is warm and dry.  Neurological:     Mental Status: He is alert and oriented to person, place, and time.     Coordination: Coordination normal.  Psychiatric:        Behavior: Behavior normal. Behavior is cooperative.        Thought Content: Thought content normal.         Judgment: Judgment normal.         Patient has been counseled extensively about nutrition and exercise as well as the importance of adherence with medications and regular follow-up. The patient was given clear instructions to go to ER or return to medical center if symptoms don't improve, worsen or new problems develop. The patient verbalized understanding.   Follow-up: Return in about 2 weeks (around 07/28/2021) for BP CHECK WITH LUKE. see me in 3 months.   Gildardo Pounds, FNP-BC [facility] [place], [place]   07/18/2021, 11:08 AM

## 2021-07-15 LAB — CMP14+EGFR
ALT: 57 IU/L — ABNORMAL HIGH (ref 0–44)
AST: 36 IU/L (ref 0–40)
Albumin/Globulin Ratio: 1.4 (ref 1.2–2.2)
Albumin: 4.4 g/dL (ref 4.0–5.0)
Alkaline Phosphatase: 100 IU/L (ref 44–121)
BUN/Creatinine Ratio: 20 (ref 9–20)
BUN: 19 mg/dL (ref 6–24)
Bilirubin Total: 0.4 mg/dL (ref 0.0–1.2)
CO2: 23 mmol/L (ref 20–29)
Calcium: 9.2 mg/dL (ref 8.7–10.2)
Chloride: 102 mmol/L (ref 96–106)
Creatinine, Ser: 0.94 mg/dL (ref 0.76–1.27)
Globulin, Total: 3.1 g/dL (ref 1.5–4.5)
Glucose: 104 mg/dL — ABNORMAL HIGH (ref 65–99)
Potassium: 4.1 mmol/L (ref 3.5–5.2)
Sodium: 140 mmol/L (ref 134–144)
Total Protein: 7.5 g/dL (ref 6.0–8.5)
eGFR: 101 mL/min/{1.73_m2} (ref >59)

## 2021-07-15 LAB — CBC WITH DIFFERENTIAL/PLATELET
Basophils Absolute: 0 10*3/uL (ref 0.0–0.2)
Basos: 0 %
EOS (ABSOLUTE): 0.1 10*3/uL (ref 0.0–0.4)
Eos: 1 %
Hematocrit: 46.6 % (ref 37.5–51.0)
Hemoglobin: 16 g/dL (ref 13.0–17.7)
Immature Grans (Abs): 0 10*3/uL (ref 0.0–0.1)
Immature Granulocytes: 0 %
Lymphocytes Absolute: 2.7 10*3/uL (ref 0.7–3.1)
Lymphs: 38 %
MCH: 29.5 pg (ref 26.6–33.0)
MCHC: 34.3 g/dL (ref 31.5–35.7)
MCV: 86 fL (ref 79–97)
Monocytes Absolute: 0.6 10*3/uL (ref 0.1–0.9)
Monocytes: 8 %
Neutrophils Absolute: 3.6 10*3/uL (ref 1.4–7.0)
Neutrophils: 53 %
Platelets: 304 10*3/uL (ref 150–450)
RBC: 5.42 x10E6/uL (ref 4.14–5.80)
RDW: 13 % (ref 11.6–15.4)
WBC: 7 10*3/uL (ref 3.4–10.8)

## 2021-07-15 LAB — HEMOGLOBIN A1C
Est. average glucose Bld gHb Est-mCnc: 128 mg/dL
Hgb A1c MFr Bld: 6.1 % — ABNORMAL HIGH (ref 4.8–5.6)

## 2021-07-18 ENCOUNTER — Encounter: Payer: Self-pay | Admitting: Nurse Practitioner

## 2021-08-11 ENCOUNTER — Other Ambulatory Visit: Payer: Self-pay

## 2021-08-11 ENCOUNTER — Encounter: Payer: Self-pay | Admitting: Pharmacist

## 2021-08-11 ENCOUNTER — Ambulatory Visit: Payer: Self-pay | Attending: Nurse Practitioner | Admitting: Pharmacist

## 2021-08-11 VITALS — BP 147/91

## 2021-08-11 DIAGNOSIS — I10 Essential (primary) hypertension: Secondary | ICD-10-CM

## 2021-08-11 MED ORDER — LOSARTAN POTASSIUM 25 MG PO TABS
25.0000 mg | ORAL_TABLET | Freq: Every day | ORAL | 2 refills | Status: DC
  Filled 2021-08-11: qty 30, 30d supply, fill #0
  Filled 2021-09-18: qty 30, 30d supply, fill #1

## 2021-08-11 NOTE — Progress Notes (Signed)
   S:    PCP: Zelda   Patient arrives in good spirits. Presents to the clinic for hypertension evaluation, counseling, and management. Patient was referred and last seen by Primary Care Provider on 07/14/2021.   Medication adherence reported.   Current BP Medications include:  amlodipine 10 mg daily, losartan 12.5 mg daily  Dietary habits include: compliant with salt restriction, drinks coffee but only 1-2 cups in the morning  Exercise habits include: none  Family / Social history:  - Fhx: DM, heart disease  - Tobacco: never smoker - Alcohol: none reported   O:  Vitals:   08/11/21 1614  BP: (!) 147/91   Home BP readings: none  Last 3 Office BP readings: BP Readings from Last 3 Encounters:  08/11/21 (!) 147/91  07/14/21 (!) 148/83  02/27/21 (!) 144/88   BMET    Component Value Date/Time   NA 140 07/14/2021 1619   K 4.1 07/14/2021 1619   CL 102 07/14/2021 1619   CO2 23 07/14/2021 1619   GLUCOSE 104 (H) 07/14/2021 1619   GLUCOSE 108 (H) 01/04/2020 1453   BUN 19 07/14/2021 1619   CREATININE 0.94 07/14/2021 1619   CREATININE 1.02 05/17/2013 1653   CALCIUM 9.2 07/14/2021 1619   GFRNONAA 72 06/27/2020 1606   GFRNONAA >89 05/17/2013 1653   GFRAA 83 06/27/2020 1606   GFRAA >89 05/17/2013 1653   Renal function: CrCl cannot be calculated (Patient's most recent lab result is older than the maximum 21 days allowed.).  Clinical ASCVD: No  The 10-year ASCVD risk score (Arnett DK, et al., 2019) is: 3.2%   Values used to calculate the score:     Age: 45 years     Sex: Male     Is Non-Hispanic African American: No     Diabetic: No     Tobacco smoker: No     Systolic Blood Pressure: 893 mmHg     Is BP treated: Yes     HDL Cholesterol: 45 mg/dL     Total Cholesterol: 180 mg/dL  A/P: Hypertension longstanding currently uncontrolled on current medications. BP Goal = < 130/80 mmHg. Medication adherence reported.   -Increased dose of losartan 25 mg daily.  -Continue  amlodipine 10 mg daily.  -F/u labs ordered - BMP -Counseled on lifestyle modifications for blood pressure control including reduced dietary sodium, increased exercise, adequate sleep.  Results reviewed and written information provided. Total time in face-to-face counseling 15 minutes.   F/U Clinic Visit in 1 month.   Benard Halsted, PharmD, [place], [place] [phone]

## 2021-08-12 LAB — BASIC METABOLIC PANEL
BUN/Creatinine Ratio: 21 — ABNORMAL HIGH (ref 9–20)
BUN: 20 mg/dL (ref 6–24)
CO2: 23 mmol/L (ref 20–29)
Calcium: 8.9 mg/dL (ref 8.7–10.2)
Chloride: 102 mmol/L (ref 96–106)
Creatinine, Ser: 0.96 mg/dL (ref 0.76–1.27)
Glucose: 87 mg/dL (ref 70–99)
Potassium: 3.9 mmol/L (ref 3.5–5.2)
Sodium: 138 mmol/L (ref 134–144)
eGFR: 99 mL/min/{1.73_m2} (ref >59)

## 2021-08-28 ENCOUNTER — Telehealth: Payer: Self-pay | Admitting: Nurse Practitioner

## 2021-08-28 NOTE — Telephone Encounter (Signed)
Copied from [place] [id number]. Topic: Appointment Scheduling - Scheduling Inquiry for Clinic >> Aug 24, 2021  3:33 PM Pawlus, Brayton Layman A wrote: Reason for CRM: Pt wanted to schedule an appt with Clifton James to renew orange card / financial appt.

## 2021-08-28 NOTE — Telephone Encounter (Signed)
I returned Pt call, LVM to call us back to schedule a financial appt

## 2021-09-18 ENCOUNTER — Other Ambulatory Visit: Payer: Self-pay

## 2021-10-02 ENCOUNTER — Other Ambulatory Visit: Payer: Self-pay

## 2021-10-02 ENCOUNTER — Encounter: Payer: Self-pay | Admitting: Nurse Practitioner

## 2021-10-02 ENCOUNTER — Ambulatory Visit: Payer: Self-pay | Attending: Nurse Practitioner | Admitting: Nurse Practitioner

## 2021-10-02 VITALS — BP 144/70 | HR 76 | Ht 64.0 in | Wt 241.2 lb

## 2021-10-02 DIAGNOSIS — R7303 Prediabetes: Secondary | ICD-10-CM

## 2021-10-02 DIAGNOSIS — I10 Essential (primary) hypertension: Secondary | ICD-10-CM

## 2021-10-02 MED ORDER — AMLODIPINE BESYLATE 10 MG PO TABS
ORAL_TABLET | Freq: Every day | ORAL | 1 refills | Status: DC
  Filled 2021-10-02: qty 90, 90d supply, fill #0

## 2021-10-02 MED ORDER — LOSARTAN POTASSIUM 50 MG PO TABS
50.0000 mg | ORAL_TABLET | Freq: Every day | ORAL | 1 refills | Status: DC
  Filled 2021-10-02: qty 90, 90d supply, fill #0
  Filled 2022-01-12: qty 30, 30d supply, fill #0
  Filled 2022-01-12: qty 90, 90d supply, fill #1

## 2021-10-02 MED ORDER — METFORMIN HCL 500 MG PO TABS
1000.0000 mg | ORAL_TABLET | Freq: Every day | ORAL | 1 refills | Status: DC
  Filled 2021-10-02: qty 180, 90d supply, fill #0

## 2021-10-02 NOTE — Progress Notes (Signed)
Assessment & Plan:  Austin Rubio was seen today for hypertension.  Diagnoses and all orders for this visit:  Essential hypertension -     amLODipine (NORVASC) 10 MG tablet; TAKE 1 TABLET (10 MG TOTAL) BY MOUTH DAILY. -     losartan (COZAAR) 50 MG tablet; Take 1 tablet (50 mg total) by mouth daily. Continue all antihypertensives as prescribed.  Remember to bring in your blood pressure log with you for your follow up appointment.  DASH/Mediterranean Diets are healthier choices for HTN.    Prediabetes -     metFORMIN (GLUCOPHAGE) 500 MG tablet; Take 2 tablets (1,000 mg total) by mouth daily with breakfast. Continue blood sugar control as discussed in office today, low carbohydrate diet, and regular physical exercise as tolerated, 150 minutes per week (30 min each day, 5 days per week, or 50 min 3 days per week). Keep blood sugar logs with fasting goal of 90-130 mg/dl, post prandial (after you eat) less than 180.     Patient has been counseled on age-appropriate routine health concerns for screening and prevention. These are reviewed and up-to-date. Referrals have been placed accordingly. Immunizations are up-to-date or declined.    Subjective:   Chief Complaint  Patient presents with   Hypertension    Austin Rubio 45 y.o. male presents to office today for HTN.  He has a past medical history of Complication of anesthesia, Hyperlipidemia, Pre-diabetes, and Unspecified essential hypertension (05/17/2013).    HTN Blood pressure elevated. He does endorse medication adherence taking amlodipine 10 mg daily and losartan 25 mg daily. Will increase to  losartan 50 mg daily  BP Readings from Last 3 Encounters:  10/02/21 (!) 144/70  08/11/21 (!) 147/91  07/14/21 (!) 148/83   Prediabetes Taking metformin 1000 mg daily as prescribed. LDL not at goal with atorvastatin 10 mg daily.  Lab Results  Component Value Date   HGBA1C 6.1 (H) 07/14/2021    Lab Results  Component Value Date   LDLCALC  112 (H) 02/27/2021     Review of Systems  Constitutional:  Negative for fever, malaise/fatigue and weight loss.  HENT: Negative.  Negative for nosebleeds.   Eyes: Negative.  Negative for blurred vision, double vision and photophobia.  Respiratory: Negative.  Negative for cough and shortness of breath.   Cardiovascular: Negative.  Negative for chest pain, palpitations and leg swelling.  Gastrointestinal: Negative.  Negative for heartburn, nausea and vomiting.  Musculoskeletal: Negative.  Negative for myalgias.  Neurological: Negative.  Negative for dizziness, focal weakness, seizures and headaches.  Psychiatric/Behavioral: Negative.  Negative for suicidal ideas.    Past Medical History:  Diagnosis Date   Complication of anesthesia    syncope after bgetting home from surgery.    Hyperlipidemia    Pre-diabetes    Unspecified essential hypertension 05/17/2013    Past Surgical History:  Procedure Laterality Date   HERNIA REPAIR     XI ROBOTIC ASSISTED VENTRAL HERNIA N/A 03/06/2020   Procedure: XI ROBOTIC ASSISTED VENTRAL HERNIA;  Surgeon: Jules Husbands, MD;  Location: [facility] ORS;  Service: General;  Laterality: N/A;    Family History  Problem Relation Age of Onset   Diabetes Mother    Heart disease Father     Social History Reviewed with no changes to be made today.   Outpatient Medications Prior to Visit  Medication Sig Dispense Refill   atorvastatin (LIPITOR) 10 MG tablet TAKE 1 TABLET (10 MG TOTAL) BY MOUTH DAILY. 90 tablet 3   amLODipine (  NORVASC) 10 MG tablet TAKE 1 TABLET (10 MG TOTAL) BY MOUTH DAILY. 90 tablet 1   losartan (COZAAR) 25 MG tablet Take 1 tablet (25 mg total) by mouth daily. 30 tablet 2   metFORMIN (GLUCOPHAGE) 500 MG tablet TAKE 2 TABLETS (1,000 MG TOTAL) BY MOUTH DAILY WITH BREAKFAST. 180 tablet 1   No facility-administered medications prior to visit.    No Known Allergies     Objective:    BP (!) 144/70   Pulse 76   Ht 5\' 4"  (1.626 m)   Wt 241 lb  4 oz (109.4 kg)   SpO2 97%   BMI 41.41 kg/m  Wt Readings from Last 3 Encounters:  10/02/21 241 lb 4 oz (109.4 kg)  07/14/21 213 lb (96.6 kg)  02/27/21 208 lb 12.8 oz (94.7 kg)    Physical Exam Vitals and nursing note reviewed.  Constitutional:      Appearance: He is well-developed.  HENT:     Head: Normocephalic and atraumatic.  Cardiovascular:     Rate and Rhythm: Normal rate and regular rhythm.     Heart sounds: Normal heart sounds. No murmur heard.   No friction rub. No gallop.  Pulmonary:     Effort: Pulmonary effort is normal. No tachypnea or respiratory distress.     Breath sounds: Normal breath sounds. No decreased breath sounds, wheezing, rhonchi or rales.  Chest:     Chest wall: No tenderness.  Abdominal:     General: Bowel sounds are normal.     Palpations: Abdomen is soft.  Musculoskeletal:        General: Normal range of motion.     Cervical back: Normal range of motion.  Skin:    General: Skin is warm and dry.  Neurological:     Mental Status: He is alert and oriented to person, place, and time.     Coordination: Coordination normal.  Psychiatric:        Behavior: Behavior normal. Behavior is cooperative.        Thought Content: Thought content normal.        Judgment: Judgment normal.         Patient has been counseled extensively about nutrition and exercise as well as the importance of adherence with medications and regular follow-up. The patient was given clear instructions to go to ER or return to medical center if symptoms don't improve, worsen or new problems develop. The patient verbalized understanding.   Follow-up: Return in about 3 weeks (around 10/23/2021) for BP CHECK WITH LUKE. Gildardo Pounds, FNP-BC [facility] [place], [place]   10/02/2021, 5:05 PM

## 2021-10-02 NOTE — Progress Notes (Signed)
Austin Rubio [phone]

## 2021-10-05 ENCOUNTER — Other Ambulatory Visit: Payer: Self-pay

## 2021-10-07 ENCOUNTER — Other Ambulatory Visit: Payer: Self-pay

## 2021-10-08 ENCOUNTER — Ambulatory Visit: Payer: Self-pay | Attending: Family Medicine

## 2021-10-08 ENCOUNTER — Other Ambulatory Visit: Payer: Self-pay

## 2021-10-16 ENCOUNTER — Other Ambulatory Visit: Payer: Self-pay

## 2021-10-16 ENCOUNTER — Ambulatory Visit: Payer: Self-pay | Attending: Nurse Practitioner | Admitting: Pharmacist

## 2021-10-16 ENCOUNTER — Encounter: Payer: Self-pay | Admitting: Pharmacist

## 2021-10-16 VITALS — BP 129/83

## 2021-10-16 DIAGNOSIS — I10 Essential (primary) hypertension: Secondary | ICD-10-CM

## 2021-10-16 DIAGNOSIS — E785 Hyperlipidemia, unspecified: Secondary | ICD-10-CM

## 2021-10-16 MED ORDER — ATORVASTATIN CALCIUM 10 MG PO TABS
ORAL_TABLET | Freq: Every day | ORAL | 1 refills | Status: DC
  Filled 2021-10-16: qty 30, 30d supply, fill #0
  Filled 2021-12-14: qty 30, 30d supply, fill #1
  Filled 2021-12-14: qty 30, 30d supply, fill #0

## 2021-10-16 NOTE — Progress Notes (Signed)
° °  S:    PCP: Zelda   Patient arrives in good spirits. Presents to the clinic for hypertension evaluation, counseling, and management. Patient was referred and last seen by Primary Care Provider on 10/02/2021. BP had improved but was still above goal. Losartan was increased to 50 mg daily.    Medication adherence reported. He has taken his BP medication today.   Current BP Medications include:  amlodipine 10 mg daily, losartan 50 mg daily  Dietary habits include: compliant with salt restriction, drinks coffee but only 1-2 cups in the morning  Exercise habits include: none  Family / Social history:  - Fhx: DM, heart disease  - Tobacco: never smoker - Alcohol: none reported   O:  Vitals:   10/16/21 1515  BP: 129/83   Home BP readings: none  Last 3 Office BP readings: BP Readings from Last 3 Encounters:  10/16/21 129/83  10/02/21 (!) 144/70  08/11/21 (!) 147/91   BMET    Component Value Date/Time   NA 138 08/11/2021 1618   K 3.9 08/11/2021 1618   CL 102 08/11/2021 1618   CO2 23 08/11/2021 1618   GLUCOSE 87 08/11/2021 1618   GLUCOSE 108 (H) 01/04/2020 1453   BUN 20 08/11/2021 1618   CREATININE 0.96 08/11/2021 1618   CREATININE 1.02 05/17/2013 1653   CALCIUM 8.9 08/11/2021 1618   GFRNONAA 72 06/27/2020 1606   GFRNONAA >89 05/17/2013 1653   GFRAA 83 06/27/2020 1606   GFRAA >89 05/17/2013 1653   Renal function: CrCl cannot be calculated (Patient's most recent lab result is older than the maximum 21 days allowed.).  Clinical ASCVD: No  The 10-year ASCVD risk score (Arnett DK, et al., 2019) is: 2.9%   Values used to calculate the score:     Age: 45 years     Sex: Male     Is Non-Hispanic African American: No     Diabetic: No     Tobacco smoker: No     Systolic Blood Pressure: 505 mmHg     Is BP treated: Yes     HDL Cholesterol: 45 mg/dL     Total Cholesterol: 180 mg/dL  A/P: Hypertension longstanding currently at goal on current medications. BP Goal = < 130/80  mmHg. Medication adherence reported.   -Continue losartan 50 mg daily -Continue amlodipine 10 mg daily.  -F/u labs ordered - BMP at follow-up in 1 month -Counseled on lifestyle modifications for blood pressure control including reduced dietary sodium, increased exercise, adequate sleep.  Results reviewed and written information provided. Total time in face-to-face counseling 15 minutes.   F/U Clinic Visit in 1 month.   Benard Halsted, PharmD, [facility], [place] [phone]

## 2021-11-01 DIAGNOSIS — C44599 Other specified malignant neoplasm of skin of other part of trunk: Secondary | ICD-10-CM

## 2021-11-01 DIAGNOSIS — C4499 Other specified malignant neoplasm of skin, unspecified: Secondary | ICD-10-CM

## 2021-11-01 HISTORY — DX: Other specified malignant neoplasm of skin of other part of trunk: C44.599

## 2021-11-01 HISTORY — DX: Other specified malignant neoplasm of skin, unspecified: C44.99

## 2021-11-06 ENCOUNTER — Ambulatory Visit
Admission: RE | Admit: 2021-11-06 | Discharge: 2021-11-06 | Disposition: A | Discharge status: Home / Self Care | Payer: Self-pay | Source: Ambulatory Visit | Attending: Nurse Practitioner | Admitting: Nurse Practitioner

## 2021-11-06 ENCOUNTER — Other Ambulatory Visit: Payer: Self-pay | Admitting: Nurse Practitioner

## 2021-11-06 DIAGNOSIS — N63 Unspecified lump in unspecified breast: Secondary | ICD-10-CM

## 2021-11-06 DIAGNOSIS — R928 Other abnormal and inconclusive findings on diagnostic imaging of breast: Secondary | ICD-10-CM

## 2021-11-09 ENCOUNTER — Telehealth: Payer: Self-pay

## 2021-11-09 NOTE — Telephone Encounter (Signed)
-----   Message from Gildardo Pounds, NP sent at 11/08/2021  8:57 AM EST ----- Left breast biopsy scheduled for January 13th @1030

## 2021-11-09 NOTE — Telephone Encounter (Signed)
Left message to return call to our office.  

## 2021-11-13 ENCOUNTER — Ambulatory Visit
Admission: RE | Admit: 2021-11-13 | Discharge: 2021-11-13 | Disposition: A | Discharge status: Home / Self Care | Payer: No Typology Code available for payment source | Source: Ambulatory Visit | Attending: Nurse Practitioner | Admitting: Nurse Practitioner

## 2021-11-13 ENCOUNTER — Other Ambulatory Visit: Payer: Self-pay | Admitting: Radiology

## 2021-11-13 ENCOUNTER — Other Ambulatory Visit: Payer: Self-pay | Admitting: Nurse Practitioner

## 2021-11-13 DIAGNOSIS — N63 Unspecified lump in unspecified breast: Secondary | ICD-10-CM

## 2021-11-13 DIAGNOSIS — R928 Other abnormal and inconclusive findings on diagnostic imaging of breast: Secondary | ICD-10-CM

## 2021-11-19 NOTE — Progress Notes (Signed)
° °  S:    PCP: Zelda   Patient presents to the clinic for hypertension evaluation, counseling, and management. Patient was referred and last seen by Primary Care Provider on 10/02/2021. BP had improved but was still above goal and losartan was increased to 50 mg daily. Last seen by CPP 10/16/21 when BP was at goal and current medications were continued.   Today, patient arrives in good spirits. Reports that he is having a surgery tomorrow to remove a mass on his chest. He had a BMET drawn today in anticipation for this. Denies headache, chest pain, blurred vision, dizziness. Medication adherence reported. Has taken BP medications this morning.   Current BP Medications include:  amlodipine 10 mg daily, losartan 50 mg daily  Dietary habits include: compliant with salt restriction, drinks coffee but only 1-2 cups in the morning  Exercise habits include: none  Family / Social history:  - Fhx: DM, heart disease  - Tobacco: never smoker - Alcohol: none reported   O:  Vitals:   11/23/21 1619  BP: 128/79  Pulse: 80   Home BP readings: none  Last 3 Office BP readings: BP Readings from Last 3 Encounters:  11/23/21 128/79  10/16/21 129/83  10/02/21 (!) 144/70   BMET    Component Value Date/Time   NA 138 08/11/2021 1618   K 3.9 08/11/2021 1618   CL 102 08/11/2021 1618   CO2 23 08/11/2021 1618   GLUCOSE 87 08/11/2021 1618   GLUCOSE 108 (H) 01/04/2020 1453   BUN 20 08/11/2021 1618   CREATININE 0.96 08/11/2021 1618   CREATININE 1.02 05/17/2013 1653   CALCIUM 8.9 08/11/2021 1618   GFRNONAA 72 06/27/2020 1606   GFRNONAA >89 05/17/2013 1653   GFRAA 83 06/27/2020 1606   GFRAA >89 05/17/2013 1653   Renal function: CrCl cannot be calculated (Patient's most recent lab result is older than the maximum 21 days allowed.).  Clinical ASCVD: No  The 10-year ASCVD risk score (Arnett DK, et al., 2019) is: 2.8%   Values used to calculate the score:     Age: 46 years     Sex: Male     Is  Non-Hispanic African American: No     Diabetic: No     Tobacco smoker: No     Systolic Blood Pressure: 960 mmHg     Is BP treated: Yes     HDL Cholesterol: 45 mg/dL     Total Cholesterol: 180 mg/dL  A/P: Hypertension longstanding currently at goal on current medications. BP Goal = < 130/80 mmHg. Medication adherence reported.  -Continue losartan 50 mg daily.  -Continue amlodipine 10 mg daily.  -F/u labs ordered - BMET drawn today -Counseled on lifestyle modifications for blood pressure control including reduced dietary sodium, increased exercise, adequate sleep.  Results reviewed and written information provided. Total time in face-to-face counseling 15 minutes. F/U PCP Clinic Visit as planned on 11/27/21.    Rebbeca Paul, PharmD PGY2 Ambulatory Care Pharmacy Resident 11/23/2021 4:21 PM

## 2021-11-20 ENCOUNTER — Ambulatory Visit: Payer: Self-pay | Admitting: Surgery

## 2021-11-20 DIAGNOSIS — C801 Malignant (primary) neoplasm, unspecified: Secondary | ICD-10-CM | POA: Insufficient documentation

## 2021-11-20 NOTE — H&P (View-Only) (Signed)
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 male who is seen today as an office consultation at the request of Dr. 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     This is a 46 year old male who presents with a 10-year history of a slowly enlarging mass in the left chest.  He ignored this for many years.  It began enlarging.  In May 2022, he underwent ultrasound of this area.  This revealed a 2.9 x 2.6 x 1.3 cm oval hypoechoic mass with internal vascularity.  The axilla was negative.  Mammogram and biopsy were recommended.  The patient waited until January 2023 before scheduling the studies.  The mammogram showed no breast masses.  However ultrasound showed a 5.4 x 3.0 x 4.3 cm circumscribed oval heterogeneous mass containing internal vascular flow in the left chest at 10:00 12 cm from the nipple.  Biopsy was then performed that revealed spindle cell neoplasm.  The patient denies any pain.  No palpable axillary lymph nodes.  He is referred to [place] urgently to discuss surgical options.   Review of Systems: A complete review of systems was obtained from the patient.  I have reviewed this information and discussed as appropriate with the patient.  See HPI as well for other ROS.   Review of Systems  Constitutional: Negative.   HENT: Negative.   Eyes: Negative.   Respiratory: Negative.   Cardiovascular: Negative.   Gastrointestinal: Negative.   Genitourinary: Negative.   Musculoskeletal: Negative.   Skin: Negative.   Neurological: Negative.   Endo/Heme/Allergies: Negative.   Psychiatric/Behavioral: Negative.         Medical History: Past Medical History      Past Medical History:  Diagnosis Date   Diabetes mellitus without complication (CMS-HCC)             Patient Active Problem List  Diagnosis   Malignant tumor,  spindle cell type (CMS-HCC)   Dyslipidemia   Hyperlipidemia      Past Surgical History       Past Surgical History:  Procedure Laterality Date   HERNIA REPAIR            Allergies  No Known Allergies           Current Outpatient Medications on File Prior to Visit  Medication Sig Dispense Refill   atorvastatin (LIPITOR) 10 MG tablet Take 1 tablet by mouth once daily       losartan (COZAAR) 50 MG tablet Take 50 mg by mouth once daily       metFORMIN (GLUCOPHAGE) 500 MG tablet Take 1,000 mg by mouth daily with breakfast       amLODIPine (NORVASC) 10 MG tablet Take 10 mg by mouth once daily        No current facility-administered medications on file prior to visit.      Family History       Family History  Problem Relation Age of Onset   High blood pressure (Hypertension) Mother     Diabetes Mother          Social History       Tobacco Use  Smoking Status Never  Smokeless Tobacco Never      Social History  Social History        Socioeconomic History   Marital status: Married  Tobacco  Use   Smoking status: Never   Smokeless tobacco: Never  Vaping Use   Vaping Use: Never used  Substance and Sexual Activity   Alcohol use: Yes   Drug use: Never        Objective:         Vitals:    11/20/21 1152  BP: (!) 138/92  Pulse: 78  Temp: 36.6 C (97.9 F)  SpO2: 98%  Weight: 92.8 kg (204 lb 9.6 oz)  Height: 160 cm (5\' 3" )    Body mass index is 36.24 kg/m.   Physical Exam    Constitutional:  WDWN in NAD, conversant, no obvious deformities; lying in bed comfortably Eyes:  Pupils equal, round; sclera anicteric; moist conjunctiva; no lid lag HENT:  Oral mucosa moist; good dentition  Neck:  No masses palpated, trachea midline; no thyromegaly Lungs:  CTA bilaterally; normal respiratory effort Left upper inner quadrant of the chest - protruding 5.5 cm mass - firm, mobile, no skin tethering or discoloration; no axillary lymphadenopathy CV:  Regular rate and  rhythm; no murmurs; extremities well-perfused with no edema Abd:  +bowel sounds, soft, non-tender, no palpable organomegaly; no palpable hernias Musc:  Unable to assess gait; no apparent clubbing or cyanosis in extremities Lymphatic:  No palpable cervical or axillary lymphadenopathy Skin:  Warm, dry; no sign of jaundice Psychiatric - alert and oriented x 4; calm mood and affect     Labs, Imaging and Diagnostic Testing: CLINICAL DATA:  46 year old male with LEFT UPPER breast/chest mass evaluated in May of 2022. Diagnostic mammogram and ultrasound guided biopsies were recommended at that time but the patient never returned for the studies. Patient now presents for diagnostic mammogram.   EXAM: DIGITAL DIAGNOSTIC BILATERAL MAMMOGRAM WITH TOMOSYNTHESIS AND CAD; ULTRASOUND LEFT BREAST LIMITED   TECHNIQUE: Bilateral digital diagnostic mammography and breast tomosynthesis was performed. The images were evaluated with computer-aided detection.; Targeted ultrasound examination of the left breast was performed.   COMPARISON:  03/06/2021 ultrasound.   ACR Breast Density Category a: The breast tissue is almost entirely fatty.   FINDINGS: 2D/3D full field views of both breasts demonstrate partial visualization of a large mass within the UPPER LEFT chest/breast.   No other suspicious mammographic abnormalities are noted within either breast.   On physical exam, a large firm superficial mass within the UPPER INNER LEFT chest/breast at the 10 o'clock position 12 cm from the nipple.   Targeted ultrasound is performed, showing a 5.4 x 3 x 4.3 cm circumscribed oval heterogeneous mass containing internal vascular flow at the 10 o'clock position of the LEFT breast 12 cm from the nipple. This mass measured 2.9 x 2.6 x 1.3 cm on 03/06/2021.   No abnormal LEFT axillary lymph nodes are noted.   IMPRESSION: 1. Enlarging suspicious mass within the UPPER INNER LEFT chest/breast. Tissue sampling  is recommended. 2. No abnormal appearing LEFT axillary lymph nodes. 3. No other suspicious mammographic abnormalities within either breast.   RECOMMENDATION: Ultrasound-guided LEFT breast biopsy, which will be scheduled.   I have discussed the findings and recommendations with the patient. If applicable, a reminder letter will be sent to the patient regarding the next appointment.   BI-RADS CATEGORY  4: Suspicious.     Electronically Signed   By: Margarette Canada M.D.   On: 11/06/2021 11:58     Diagnosis Breast, left, needle core biopsy, left breast 10:00 2 cmfn , same - SPINDLE CELL NEOPLASM. SEE COMMENT. Microscopic Comment The differential diagnosis includes metaplastic carcinoma, fibrosarcoma,  dedifferentiated liposarcoma and fibrosarcomatous DFSP. Dr. Alric Seton has reviewed the case and concurs with the diagnosis of spindle cell neoplasm. (MJ:kh 11/16/21) Mark Martinique MD Pathologist, Electronic Signature (Case signed 11/16/2021)   Assessment and Plan:  Diagnoses and all orders for this visit:   Malignant tumor, spindle cell type (CMS-HCC)     Wide excision of left chest spindle cell neoplasm.The surgical procedure has been discussed with the patient.  Potential risks, benefits, alternative treatments, and expected outcomes have been explained.  All of the patient's questions at this time have been answered.  The likelihood of reaching the patient's treatment goal is good.  The patient understand the proposed surgical procedure and wishes to proceed.       Mosiah Bastin Jearld Adjutant, MD  11/20/2021 12:06 PM     Moderate MDM

## 2021-11-20 NOTE — H&P (Signed)
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 male who is seen today as an office consultation at the request of Dr. 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     This is a 46 year old male who presents with a 10-year history of a slowly enlarging mass in the left chest.  He ignored this for many years.  It began enlarging.  In May 2022, he underwent ultrasound of this area.  This revealed a 2.9 x 2.6 x 1.3 cm oval hypoechoic mass with internal vascularity.  The axilla was negative.  Mammogram and biopsy were recommended.  The patient waited until January 2023 before scheduling the studies.  The mammogram showed no breast masses.  However ultrasound showed a 5.4 x 3.0 x 4.3 cm circumscribed oval heterogeneous mass containing internal vascular flow in the left chest at 10:00 12 cm from the nipple.  Biopsy was then performed that revealed spindle cell neoplasm.  The patient denies any pain.  No palpable axillary lymph nodes.  He is referred to [place] urgently to discuss surgical options.   Review of Systems: A complete review of systems was obtained from the patient.  I have reviewed this information and discussed as appropriate with the patient.  See HPI as well for other ROS.   Review of Systems  Constitutional: Negative.   HENT: Negative.   Eyes: Negative.   Respiratory: Negative.   Cardiovascular: Negative.   Gastrointestinal: Negative.   Genitourinary: Negative.   Musculoskeletal: Negative.   Skin: Negative.   Neurological: Negative.   Endo/Heme/Allergies: Negative.   Psychiatric/Behavioral: Negative.         Medical History: Past Medical History      Past Medical History:  Diagnosis Date   Diabetes mellitus without complication (CMS-HCC)             Patient Active Problem List  Diagnosis   Malignant tumor,  spindle cell type (CMS-HCC)   Dyslipidemia   Hyperlipidemia      Past Surgical History       Past Surgical History:  Procedure Laterality Date   HERNIA REPAIR            Allergies  No Known Allergies           Current Outpatient Medications on File Prior to Visit  Medication Sig Dispense Refill   atorvastatin (LIPITOR) 10 MG tablet Take 1 tablet by mouth once daily       losartan (COZAAR) 50 MG tablet Take 50 mg by mouth once daily       metFORMIN (GLUCOPHAGE) 500 MG tablet Take 1,000 mg by mouth daily with breakfast       amLODIPine (NORVASC) 10 MG tablet Take 10 mg by mouth once daily        No current facility-administered medications on file prior to visit.      Family History       Family History  Problem Relation Age of Onset   High blood pressure (Hypertension) Mother     Diabetes Mother          Social History       Tobacco Use  Smoking Status Never  Smokeless Tobacco Never      Social History  Social History        Socioeconomic History   Marital status: Married  Tobacco  Use   Smoking status: Never   Smokeless tobacco: Never  Vaping Use   Vaping Use: Never used  Substance and Sexual Activity   Alcohol use: Yes   Drug use: Never        Objective:         Vitals:    11/20/21 1152  BP: (!) 138/92  Pulse: 78  Temp: 36.6 C (97.9 F)  SpO2: 98%  Weight: 92.8 kg (204 lb 9.6 oz)  Height: 160 cm (5\' 3" )    Body mass index is 36.24 kg/m.   Physical Exam    Constitutional:  WDWN in NAD, conversant, no obvious deformities; lying in bed comfortably Eyes:  Pupils equal, round; sclera anicteric; moist conjunctiva; no lid lag HENT:  Oral mucosa moist; good dentition  Neck:  No masses palpated, trachea midline; no thyromegaly Lungs:  CTA bilaterally; normal respiratory effort Left upper inner quadrant of the chest - protruding 5.5 cm mass - firm, mobile, no skin tethering or discoloration; no axillary lymphadenopathy CV:  Regular rate and  rhythm; no murmurs; extremities well-perfused with no edema Abd:  +bowel sounds, soft, non-tender, no palpable organomegaly; no palpable hernias Musc:  Unable to assess gait; no apparent clubbing or cyanosis in extremities Lymphatic:  No palpable cervical or axillary lymphadenopathy Skin:  Warm, dry; no sign of jaundice Psychiatric - alert and oriented x 4; calm mood and affect     Labs, Imaging and Diagnostic Testing: CLINICAL DATA:  46 year old male with LEFT UPPER breast/chest mass evaluated in May of 2022. Diagnostic mammogram and ultrasound guided biopsies were recommended at that time but the patient never returned for the studies. Patient now presents for diagnostic mammogram.   EXAM: DIGITAL DIAGNOSTIC BILATERAL MAMMOGRAM WITH TOMOSYNTHESIS AND CAD; ULTRASOUND LEFT BREAST LIMITED   TECHNIQUE: Bilateral digital diagnostic mammography and breast tomosynthesis was performed. The images were evaluated with computer-aided detection.; Targeted ultrasound examination of the left breast was performed.   COMPARISON:  03/06/2021 ultrasound.   ACR Breast Density Category a: The breast tissue is almost entirely fatty.   FINDINGS: 2D/3D full field views of both breasts demonstrate partial visualization of a large mass within the UPPER LEFT chest/breast.   No other suspicious mammographic abnormalities are noted within either breast.   On physical exam, a large firm superficial mass within the UPPER INNER LEFT chest/breast at the 10 o'clock position 12 cm from the nipple.   Targeted ultrasound is performed, showing a 5.4 x 3 x 4.3 cm circumscribed oval heterogeneous mass containing internal vascular flow at the 10 o'clock position of the LEFT breast 12 cm from the nipple. This mass measured 2.9 x 2.6 x 1.3 cm on 03/06/2021.   No abnormal LEFT axillary lymph nodes are noted.   IMPRESSION: 1. Enlarging suspicious mass within the UPPER INNER LEFT chest/breast. Tissue sampling  is recommended. 2. No abnormal appearing LEFT axillary lymph nodes. 3. No other suspicious mammographic abnormalities within either breast.   RECOMMENDATION: Ultrasound-guided LEFT breast biopsy, which will be scheduled.   I have discussed the findings and recommendations with the patient. If applicable, a reminder letter will be sent to the patient regarding the next appointment.   BI-RADS CATEGORY  4: Suspicious.     Electronically Signed   By: Margarette Canada M.D.   On: 11/06/2021 11:58     Diagnosis Breast, left, needle core biopsy, left breast 10:00 2 cmfn , same - SPINDLE CELL NEOPLASM. SEE COMMENT. Microscopic Comment The differential diagnosis includes metaplastic carcinoma, fibrosarcoma,  dedifferentiated liposarcoma and fibrosarcomatous DFSP. Dr. Alric Seton has reviewed the case and concurs with the diagnosis of spindle cell neoplasm. (MJ:kh 11/16/21) Mark Martinique MD Pathologist, Electronic Signature (Case signed 11/16/2021)   Assessment and Plan:  Diagnoses and all orders for this visit:   Malignant tumor, spindle cell type (CMS-HCC)     Wide excision of left chest spindle cell neoplasm.The surgical procedure has been discussed with the patient.  Potential risks, benefits, alternative treatments, and expected outcomes have been explained.  All of the patient's questions at this time have been answered.  The likelihood of reaching the patient's treatment goal is good.  The patient understand the proposed surgical procedure and wishes to proceed.       Dorrine Montone Jearld Adjutant, MD  11/20/2021 12:06 PM     Moderate MDM

## 2021-11-23 ENCOUNTER — Other Ambulatory Visit: Payer: Self-pay

## 2021-11-23 ENCOUNTER — Encounter (HOSPITAL_BASED_OUTPATIENT_CLINIC_OR_DEPARTMENT_OTHER): Payer: Self-pay | Admitting: Surgery

## 2021-11-23 ENCOUNTER — Ambulatory Visit: Payer: Self-pay | Attending: Nurse Practitioner | Admitting: Pharmacist

## 2021-11-23 ENCOUNTER — Encounter (HOSPITAL_BASED_OUTPATIENT_CLINIC_OR_DEPARTMENT_OTHER)
Admission: RE | Admit: 2021-11-23 | Discharge: 2021-11-23 | Disposition: A | Discharge status: Home / Self Care | Payer: No Typology Code available for payment source | Source: Ambulatory Visit | Attending: Surgery | Admitting: Surgery

## 2021-11-23 VITALS — BP 128/79 | HR 80

## 2021-11-23 DIAGNOSIS — I10 Essential (primary) hypertension: Secondary | ICD-10-CM

## 2021-11-23 LAB — BASIC METABOLIC PANEL
Anion gap: 6 (ref 5–15)
BUN: 13 mg/dL (ref 6–20)
CO2: 26 mmol/L (ref 22–32)
Calcium: 8.5 mg/dL — ABNORMAL LOW (ref 8.9–10.3)
Chloride: 109 mmol/L (ref 98–111)
Creatinine, Ser: 0.89 mg/dL (ref 0.61–1.24)
GFR, Estimated: >60 mL/min (ref >60)
Glucose, Bld: 92 mg/dL (ref 70–99)
Potassium: 4.2 mmol/L (ref 3.5–5.1)
Sodium: 141 mmol/L (ref 135–145)

## 2021-11-23 NOTE — Progress Notes (Signed)

## 2021-11-24 ENCOUNTER — Ambulatory Visit (HOSPITAL_BASED_OUTPATIENT_CLINIC_OR_DEPARTMENT_OTHER): Payer: No Typology Code available for payment source | Admitting: Certified Registered"

## 2021-11-24 ENCOUNTER — Other Ambulatory Visit: Payer: Self-pay

## 2021-11-24 ENCOUNTER — Encounter (HOSPITAL_BASED_OUTPATIENT_CLINIC_OR_DEPARTMENT_OTHER): Payer: Self-pay | Admitting: Surgery

## 2021-11-24 ENCOUNTER — Encounter (HOSPITAL_BASED_OUTPATIENT_CLINIC_OR_DEPARTMENT_OTHER)
Admission: RE | Disposition: A | Discharge status: Home / Self Care | Payer: Self-pay | Source: Home / Self Care | Attending: Surgery

## 2021-11-24 ENCOUNTER — Ambulatory Visit (HOSPITAL_BASED_OUTPATIENT_CLINIC_OR_DEPARTMENT_OTHER)
Admission: RE | Admit: 2021-11-24 | Discharge: 2021-11-24 | Disposition: A | Discharge status: Home / Self Care | Payer: No Typology Code available for payment source | Attending: Surgery | Admitting: Surgery

## 2021-11-24 DIAGNOSIS — I10 Essential (primary) hypertension: Secondary | ICD-10-CM | POA: Insufficient documentation

## 2021-11-24 DIAGNOSIS — C44599 Other specified malignant neoplasm of skin of other part of trunk: Secondary | ICD-10-CM | POA: Insufficient documentation

## 2021-11-24 DIAGNOSIS — E119 Type 2 diabetes mellitus without complications: Secondary | ICD-10-CM | POA: Insufficient documentation

## 2021-11-24 DIAGNOSIS — E669 Obesity, unspecified: Secondary | ICD-10-CM | POA: Insufficient documentation

## 2021-11-24 DIAGNOSIS — Z7984 Long term (current) use of oral hypoglycemic drugs: Secondary | ICD-10-CM | POA: Insufficient documentation

## 2021-11-24 DIAGNOSIS — Z6834 Body mass index (BMI) 34.0-34.9, adult: Secondary | ICD-10-CM | POA: Insufficient documentation

## 2021-11-24 HISTORY — PX: MELANOMA EXCISION: SHX5266

## 2021-11-24 SURGERY — EXCISION, MELANOMA
Anesthesia: General | Site: Chest | Laterality: Left

## 2021-11-24 MED ORDER — MIDAZOLAM HCL 2 MG/2ML IJ SOLN
INTRAMUSCULAR | Status: AC
  Filled 2021-11-24: qty 2

## 2021-11-24 MED ORDER — BUPIVACAINE-EPINEPHRINE (PF) 0.25% -1:200000 IJ SOLN
INTRAMUSCULAR | Status: DC | PRN
  Administered 2021-11-24: 20 mL

## 2021-11-24 MED ORDER — CEFAZOLIN SODIUM-DEXTROSE 2-4 GM/100ML-% IV SOLN
2.0000 g | INTRAVENOUS | Status: AC
  Administered 2021-11-24: 12:00:00 2 g via INTRAVENOUS

## 2021-11-24 MED ORDER — ROCURONIUM BROMIDE 10 MG/ML (PF) SYRINGE
PREFILLED_SYRINGE | INTRAVENOUS | Status: AC
  Filled 2021-11-24: qty 10

## 2021-11-24 MED ORDER — ROCURONIUM BROMIDE 10 MG/ML (PF) SYRINGE
PREFILLED_SYRINGE | INTRAVENOUS | Status: DC | PRN
  Administered 2021-11-24: 30 mg via INTRAVENOUS

## 2021-11-24 MED ORDER — CHLORHEXIDINE GLUCONATE CLOTH 2 % EX PADS: 6.0000 | MEDICATED_PAD | Freq: Once | CUTANEOUS | Status: DC

## 2021-11-24 MED ORDER — OXYCODONE HCL 5 MG PO TABS: 5.0000 mg | ORAL_TABLET | Freq: Four times a day (QID) | ORAL | 0 refills | Status: DC | PRN

## 2021-11-24 MED ORDER — LIDOCAINE HCL (CARDIAC) PF 100 MG/5ML IV SOSY
PREFILLED_SYRINGE | INTRAVENOUS | Status: DC | PRN
  Administered 2021-11-24: 60 mg via INTRAVENOUS

## 2021-11-24 MED ORDER — OXYCODONE HCL 5 MG PO TABS: 5.0000 mg | ORAL_TABLET | Freq: Once | ORAL | Status: DC | PRN

## 2021-11-24 MED ORDER — DEXAMETHASONE SODIUM PHOSPHATE 4 MG/ML IJ SOLN
INTRAMUSCULAR | Status: DC | PRN
  Administered 2021-11-24: 5 mg via INTRAVENOUS

## 2021-11-24 MED ORDER — MIDAZOLAM HCL 5 MG/5ML IJ SOLN
INTRAMUSCULAR | Status: DC | PRN
  Administered 2021-11-24: 2 mg via INTRAVENOUS

## 2021-11-24 MED ORDER — SUCCINYLCHOLINE CHLORIDE 200 MG/10ML IV SOSY
PREFILLED_SYRINGE | INTRAVENOUS | Status: DC | PRN
  Administered 2021-11-24: 120 mg via INTRAVENOUS

## 2021-11-24 MED ORDER — PROMETHAZINE HCL 25 MG/ML IJ SOLN: 6.2500 mg | INTRAMUSCULAR | Status: DC | PRN

## 2021-11-24 MED ORDER — AMISULPRIDE (ANTIEMETIC) 5 MG/2ML IV SOLN: 10.0000 mg | Freq: Once | INTRAVENOUS | Status: DC | PRN

## 2021-11-24 MED ORDER — PROPOFOL 10 MG/ML IV BOLUS
INTRAVENOUS | Status: AC
  Filled 2021-11-24: qty 20

## 2021-11-24 MED ORDER — SUGAMMADEX SODIUM 200 MG/2ML IV SOLN
INTRAVENOUS | Status: DC | PRN
  Administered 2021-11-24: 200 mg via INTRAVENOUS

## 2021-11-24 MED ORDER — HYDROMORPHONE HCL 1 MG/ML IJ SOLN: 0.2500 mg | INTRAMUSCULAR | Status: DC | PRN

## 2021-11-24 MED ORDER — CEFAZOLIN SODIUM-DEXTROSE 2-4 GM/100ML-% IV SOLN
INTRAVENOUS | Status: AC
  Filled 2021-11-24: qty 100

## 2021-11-24 MED ORDER — FENTANYL CITRATE (PF) 100 MCG/2ML IJ SOLN
INTRAMUSCULAR | Status: AC
  Filled 2021-11-24: qty 2

## 2021-11-24 MED ORDER — LACTATED RINGERS IV SOLN: INTRAVENOUS | Status: DC

## 2021-11-24 MED ORDER — OXYCODONE HCL 5 MG/5ML PO SOLN: 5.0000 mg | Freq: Once | ORAL | Status: DC | PRN

## 2021-11-24 MED ORDER — PROPOFOL 10 MG/ML IV BOLUS
INTRAVENOUS | Status: DC | PRN
Start: 2021-11-24 — End: 2021-11-24
  Administered 2021-11-24: 200 mg via INTRAVENOUS
  Administered 2021-11-24: 60 mg via INTRAVENOUS

## 2021-11-24 MED ORDER — FENTANYL CITRATE (PF) 100 MCG/2ML IJ SOLN
INTRAMUSCULAR | Status: DC | PRN
Start: 2021-11-24 — End: 2021-11-24
  Administered 2021-11-24 (×3): 50 ug via INTRAVENOUS

## 2021-11-24 MED ORDER — MEPERIDINE HCL 25 MG/ML IJ SOLN: 6.2500 mg | INTRAMUSCULAR | Status: DC | PRN

## 2021-11-24 MED ORDER — ONDANSETRON HCL 4 MG/2ML IJ SOLN
INTRAMUSCULAR | Status: DC | PRN
  Administered 2021-11-24: 4 mg via INTRAVENOUS

## 2021-11-24 MED ORDER — ACETAMINOPHEN 500 MG PO TABS
1000.0000 mg | ORAL_TABLET | ORAL | Status: AC
  Administered 2021-11-24: 10:00:00 1000 mg via ORAL

## 2021-11-24 MED ORDER — ACETAMINOPHEN 500 MG PO TABS
ORAL_TABLET | ORAL | Status: AC
  Filled 2021-11-24: qty 2

## 2021-11-24 SURGICAL SUPPLY — 41 items
APL PRP STRL LF DISP 70% ISPRP (MISCELLANEOUS) ×1
APL SKNCLS STERI-STRIP NONHPOA (GAUZE/BANDAGES/DRESSINGS) ×1
BENZOIN TINCTURE PRP APPL 2/3 (GAUZE/BANDAGES/DRESSINGS) ×3 IMPLANT
BLADE CLIPPER SURG (BLADE) ×2 IMPLANT
BLADE HEX COATED 2.75 (ELECTRODE) ×3 IMPLANT
BLADE SURG 15 STRL LF DISP TIS (BLADE) ×1 IMPLANT
BLADE SURG 15 STRL SS (BLADE) ×3
CANISTER SUCT 1200ML W/VALVE (MISCELLANEOUS) ×2 IMPLANT
CHLORAPREP W/TINT 26 (MISCELLANEOUS) ×3 IMPLANT
CLOSURE WOUND 1/2 X4 (GAUZE/BANDAGES/DRESSINGS) ×1
COVER BACK TABLE 60X90IN (DRAPES) ×3 IMPLANT
COVER MAYO STAND STRL (DRAPES) ×3 IMPLANT
DRAPE LAPAROSCOPIC ABDOMINAL (DRAPES) ×2 IMPLANT
DRAPE UTILITY XL STRL (DRAPES) ×3 IMPLANT
DRSG TEGADERM 4X4.75 (GAUZE/BANDAGES/DRESSINGS) ×4 IMPLANT
ELECT REM PT RETURN 9FT ADLT (ELECTROSURGICAL) ×3
ELECTRODE REM PT RTRN 9FT ADLT (ELECTROSURGICAL) ×1 IMPLANT
GAUZE SPONGE 4X4 12PLY STRL LF (GAUZE/BANDAGES/DRESSINGS) ×2 IMPLANT
GLOVE SURG ENC MOIS LTX SZ7 (GLOVE) ×3 IMPLANT
GLOVE SURG POLYISO LF SZ7 (GLOVE) ×2 IMPLANT
GLOVE SURG UNDER POLY LF SZ7 (GLOVE) ×2 IMPLANT
GLOVE SURG UNDER POLY LF SZ7.5 (GLOVE) ×3 IMPLANT
GOWN STRL REUS W/ TWL LRG LVL3 (GOWN DISPOSABLE) ×1 IMPLANT
GOWN STRL REUS W/TWL LRG LVL3 (GOWN DISPOSABLE) ×6
KIT MARKER MARGIN INK (KITS) ×2 IMPLANT
NDL HYPO 25X1 1.5 SAFETY (NEEDLE) ×2 IMPLANT
NEEDLE HYPO 25X1 1.5 SAFETY (NEEDLE) ×3 IMPLANT
NS IRRIG 1000ML POUR BTL (IV SOLUTION) IMPLANT
PACK BASIN DAY SURGERY FS (CUSTOM PROCEDURE TRAY) ×3 IMPLANT
PENCIL SMOKE EVACUATOR (MISCELLANEOUS) ×3 IMPLANT
SLEEVE SCD COMPRESS KNEE MED (STOCKING) ×2 IMPLANT
SPONGE T-LAP 4X18 ~~LOC~~+RFID (SPONGE) ×3 IMPLANT
STRIP CLOSURE SKIN 1/2X4 (GAUZE/BANDAGES/DRESSINGS) ×2 IMPLANT
SUT MON AB 4-0 PC3 18 (SUTURE) ×1 IMPLANT
SUT SILK 2 0 SH (SUTURE) IMPLANT
SUT VICRYL 3-0 CR8 SH (SUTURE) ×2 IMPLANT
SYR CONTROL 10ML LL (SYRINGE) ×3 IMPLANT
TOWEL GREEN STERILE FF (TOWEL DISPOSABLE) ×4 IMPLANT
TUBE CONNECTING 20'X1/4 (TUBING) ×1
TUBE CONNECTING 20X1/4 (TUBING) ×1 IMPLANT
YANKAUER SUCT BULB TIP NO VENT (SUCTIONS) ×2 IMPLANT

## 2021-11-24 NOTE — Anesthesia Procedure Notes (Signed)
Procedure Name: LMA Insertion Date/Time: 11/24/2021 11:35 AM Performed by: Tawni Millers, CRNA Pre-anesthesia Checklist: Patient identified, Emergency Drugs available, Suction available and Patient being monitored Patient Re-evaluated:Patient Re-evaluated prior to induction Oxygen Delivery Method: Circle system utilized Preoxygenation: Pre-oxygenation with 100% oxygen Induction Type: IV induction Ventilation: Mask ventilation without difficulty LMA: LMA inserted LMA Size: 4.0 Number of attempts: 1 Airway Equipment and Method: Bite block Placement Confirmation: positive ETCO2 Tube secured with: Tape Dental Injury: Teeth and Oropharynx as per pre-operative assessment

## 2021-11-24 NOTE — Transfer of Care (Signed)
Immediate Anesthesia Transfer of Care Note  Patient: Austin Rubio  Procedure(s) Performed: WIDE EXCISION OF LEFT CHEST SPINDLE CELL NEOPLASM (Left: Chest)  Patient Location: PACU  Anesthesia Type:General  Level of Consciousness: sedated  Airway & Oxygen Therapy: Patient Spontanous Breathing and Patient connected to face mask oxygen  Post-op Assessment: Report given to RN and Post -op Vital signs reviewed and stable  Post vital signs: Reviewed and stable  Last Vitals:  Vitals Value Taken Time  BP    Temp    Pulse 86 11/24/21 1248  Resp 20 11/24/21 1248  SpO2 88 % 11/24/21 1248  Vitals shown include unvalidated device data.  Last Pain:  Vitals:   11/24/21 1007  TempSrc: Oral  PainSc: 0-No pain      Patients Stated Pain Goal: 3 (32/25/67 2091)  Complications: No notable events documented.

## 2021-11-24 NOTE — Op Note (Signed)
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  He ignored this for many years.  It began enlarging.  In May 2022, he underwent ultrasound of this area.  This revealed a 2.9 x 2.6 x 1.3 cm oval hypoechoic mass with internal vascularity.  The axilla was negative.  Mammogram and biopsy were recommended.  The patient waited until January 2023 before scheduling the studies.  The mammogram showed no breast masses.  However ultrasound showed a 5.4 x 3.0 x 4.3 cm circumscribed oval heterogeneous mass containing internal vascular flow in the left chest at 10:00 12 cm from the nipple.  Biopsy was then performed that revealed spindle cell neoplasm.  The patient denies any pain.  No palpable axillary lymph nodes.  He is referred to [place] urgently to discuss surgical options.      Description of procedure: The patient is brought to the operating room and placed in the supine position on the operating table.  After an adequate level of general anesthesia was obtained, his left chest was prepped with ChloraPrep and draped sterile fashion.  A timeout was taken to ensure the proper patient and proper procedure.  I made a transverse incision across the mass.  I began carefully dissecting into the dermis.  Superiorly there seem to be a clear tissue plane between the dermis and the surface of the mass.  However inferiorly the mass seems to invade the dermis.  Therefore I made an elliptical incision including the skin of the lower part of the mass.  We are able to dissect around the entire mass with some normal adipose tissue as a margin.  We dissected laterally, superiorly, and inferiorly down the chest wall.  We then peeled the entire mass off of the  underlying chest wall including the anterior fascia of the pectoralis muscle.  The specimen was oriented with a paint kit.  We irrigated thoroughly and inspected for hemostasis.  I infiltrated the pectoralis muscle as well as the subcutaneous tissues with local anesthetic.  We closed the wound with multiple interrupted deep 3-0 Vicryl sutures.  4-0 Monocryl was used to close the skin.  Benzoin and Steri-Strips were applied.  The patient was then extubated and brought to the recovery room in stable condition.  All sponge, instrument, and needle counts are correct.  Imogene Burn. Georgette Dover, MD, [facility]  General Surgery   11/24/2021 12:32 PM

## 2021-11-24 NOTE — Interval H&P Note (Signed)
History and Physical Interval Note:  11/24/2021 11:05 AM  Austin Rubio  has presented today for surgery, with the diagnosis of SPINDLE CELL NEOPLASM LEFT CHEST.  The various methods of treatment have been discussed with the patient and family. After consideration of risks, benefits and other options for treatment, the patient has consented to  Procedure(s) with comments: [place] (Left) - LMA as a surgical intervention.  The patient's history has been reviewed, patient examined, no change in status, stable for surgery.  I have reviewed the patient's chart and labs.  Questions were answered to the patient's satisfaction.     Maia Petties

## 2021-11-24 NOTE — Anesthesia Postprocedure Evaluation (Signed)
Anesthesia Post Note  Patient: Austin Rubio  Procedure(s) Performed: WIDE EXCISION OF LEFT CHEST SPINDLE CELL NEOPLASM (Left: Chest)     Patient location during evaluation: PACU Anesthesia Type: General Level of consciousness: awake and alert Pain management: pain level controlled Vital Signs Assessment: post-procedure vital signs reviewed and stable Respiratory status: spontaneous breathing, nonlabored ventilation and respiratory function stable Cardiovascular status: blood pressure returned to baseline and stable Postop Assessment: no apparent nausea or vomiting Anesthetic complications: no   No notable events documented.  Last Vitals:  Vitals:   11/24/21 1315 11/24/21 1328  BP: (!) 153/92 (!) 158/99  Pulse: 74 72  Resp: 15 18  Temp:  36.7 C  SpO2: 95% 94%    Last Pain:  Vitals:   11/24/21 1328  TempSrc:   PainSc: 0-No pain                 Lynda Rainwater

## 2021-11-24 NOTE — Anesthesia Procedure Notes (Signed)
Procedure Name: Intubation Date/Time: 11/24/2021 11:38 AM Performed by: Tawni Millers, CRNA Pre-anesthesia Checklist: Patient identified, Emergency Drugs available, Suction available and Patient being monitored Patient Re-evaluated:Patient Re-evaluated prior to induction Oxygen Delivery Method: Circle system utilized Preoxygenation: Pre-oxygenation with 100% oxygen Induction Type: IV induction Ventilation: Mask ventilation without difficulty Laryngoscope Size: Mac and 3 Grade View: Grade I Tube type: Oral Tube size: 7.5 mm Number of attempts: 1 Airway Equipment and Method: Stylet and Oral airway Placement Confirmation: ETT inserted through vocal cords under direct vision, positive ETCO2 and breath sounds checked- equal and bilateral Secured at: 21 cm Tube secured with: Tape Dental Injury: Teeth and Oropharynx as per pre-operative assessment

## 2021-11-24 NOTE — Discharge Instructions (Addendum)
[place] Office Phone Number [phone]  POST OP INSTRUCTIONS  Always review your discharge instruction sheet given to you by the facility where your surgery was performed.  IF YOU HAVE DISABILITY OR FAMILY LEAVE FORMS, YOU MUST BRING THEM TO THE OFFICE FOR PROCESSING.  DO NOT GIVE THEM TO YOUR DOCTOR.  A prescription for pain medication may be given to you upon discharge.  Take your pain medication as prescribed, if needed.  If narcotic pain medicine is not needed, then you may take acetaminophen (Tylenol) or ibuprofen (Advil) as needed. Take your usually prescribed medications unless otherwise directed If you need a refill on your pain medication, please contact your pharmacy.  They will contact our office to request authorization.  Prescriptions will not be filled after 5pm or on week-ends. You should eat very light the first 24 hours after surgery, such as soup, crackers, pudding, etc.  Resume your normal diet the day after surgery. Most patients will experience some swelling and bruising around the surgical site.  Ice packs will help.  Swelling and bruising can take several days to resolve.  It is common to experience some constipation if taking pain medication after surgery.  Increasing fluid intake and taking a stool softener will usually help or prevent this problem from occurring.  A mild laxative (Milk of Magnesia or Miralax) should be taken according to package directions if there are no bowel movements after 48 hours. You may remove your bandages 48 hours after surgery, and you may shower at that time.  You will have steri-strips (small skin tapes) in place directly over the incision.  These strips should be left on the skin for 7-10 days.   ACTIVITIES:  You may resume regular daily activities (gradually increasing) beginning the next day.   You may have sexual intercourse when it is comfortable. You may drive when you no longer are taking prescription pain  medication, you can comfortably wear a seatbelt, and you can safely maneuver your car and apply brakes. RETURN TO WORK:  1-2 weeks You should see your doctor in the office for a follow-up appointment approximately two to three weeks after your surgery.    WHEN TO CALL YOUR DOCTOR: Fever over 101.0 Nausea and/or vomiting. Extreme swelling or bruising. Continued bleeding from incision. Increased pain, redness, or drainage from the incision.  The clinic staff is available to answer your questions during regular business hours.  Please dont hesitate to call and ask to speak to one of the nurses for clinical concerns.  If you have a medical emergency, go to the nearest emergency room or call 911.  A surgeon from [facility] is always on call at the hospital.  For further questions, please visit [email or web address]     Post Anesthesia Home Care Instructions  Activity: Get plenty of rest for the remainder of the day. A responsible individual must stay with you for 24 hours following the procedure.  For the next 24 hours, DO NOT: -Drive a car -Paediatric nurse -Drink alcoholic beverages -Take any medication unless instructed by your physician -Make any legal decisions or sign important papers.  Meals: Start with liquid foods such as gelatin or soup. Progress to regular foods as tolerated. Avoid greasy, spicy, heavy foods. If nausea and/or vomiting occur, drink only clear liquids until the nausea and/or vomiting subsides. Call your physician if vomiting continues.  Special Instructions/Symptoms: Your throat may feel dry or sore from the anesthesia or the breathing tube placed in your throat  during surgery. If this causes discomfort, gargle with warm salt water. The discomfort should disappear within 24 hours.  If you had a scopolamine patch placed behind your ear for the management of post- operative nausea and/or vomiting:  1. The medication in the patch is effective  for 72 hours, after which it should be removed.  Wrap patch in a tissue and discard in the trash. Wash hands thoroughly with soap and water. 2. You may remove the patch earlier than 72 hours if you experience unpleasant side effects which may include dry mouth, dizziness or visual disturbances. 3. Avoid touching the patch. Wash your hands with soap and water after contact with the patch.

## 2021-11-24 NOTE — Anesthesia Preprocedure Evaluation (Signed)
Anesthesia Evaluation  Patient identified by MRN, date of birth, ID band Patient awake    Reviewed: Allergy & Precautions, H&P , NPO status , Patient's Chart, lab work & pertinent test results  History of Anesthesia Complications (+) history of anesthetic complications (reports syncopal event at home after anesthesia)  Airway Mallampati: II  TM Distance: >3 FB Neck ROM: full    Dental  (+) Teeth Intact   Pulmonary neg pulmonary ROS, neg COPD, Not current smoker,    breath sounds clear to auscultation       Cardiovascular hypertension, Pt. on medications (-) angina(-) Past MI (-) dysrhythmias  Rhythm:regular Rate:Normal     Neuro/Psych negative neurological ROS  negative psych ROS   GI/Hepatic negative GI ROS, Neg liver ROS,   Endo/Other  diabetes, Type 2, Oral Hypoglycemic Agents  Renal/GU      Musculoskeletal   Abdominal (+) + obese,   Peds  Hematology negative hematology ROS (+)   Anesthesia Other Findings   Reproductive/Obstetrics negative OB ROS                             Anesthesia Physical  Anesthesia Plan  ASA: 3  Anesthesia Plan: General   Post-op Pain Management:    Induction: Intravenous  PONV Risk Score and Plan: 2 and Ondansetron, Midazolam and Treatment may vary due to age or medical condition  Airway Management Planned: LMA  Additional Equipment:   Intra-op Plan:   Post-operative Plan: Extubation in OR  Informed Consent: I have reviewed the patients History and Physical, chart, labs and discussed the procedure including the risks, benefits and alternatives for the proposed anesthesia with the patient or authorized representative who has indicated his/her understanding and acceptance.     Dental Advisory Given  Plan Discussed with: Anesthesiologist  Anesthesia Plan Comments:         Anesthesia Quick Evaluation

## 2021-11-25 ENCOUNTER — Encounter (HOSPITAL_BASED_OUTPATIENT_CLINIC_OR_DEPARTMENT_OTHER): Payer: Self-pay | Admitting: Surgery

## 2021-11-27 ENCOUNTER — Ambulatory Visit: Payer: No Typology Code available for payment source | Attending: Nurse Practitioner | Admitting: Nurse Practitioner

## 2021-11-27 ENCOUNTER — Telehealth: Payer: Self-pay | Admitting: Nurse Practitioner

## 2021-11-27 ENCOUNTER — Other Ambulatory Visit: Payer: Self-pay

## 2021-11-27 NOTE — Telephone Encounter (Signed)
No answer. LVM ?

## 2021-12-02 ENCOUNTER — Ambulatory Visit: Payer: No Typology Code available for payment source

## 2021-12-14 ENCOUNTER — Other Ambulatory Visit: Payer: Self-pay

## 2021-12-22 ENCOUNTER — Ambulatory Visit: Payer: Self-pay

## 2021-12-22 ENCOUNTER — Ambulatory Visit
Admission: EM | Admit: 2021-12-22 | Discharge: 2021-12-22 | Disposition: A | Discharge status: Home / Self Care | Payer: No Typology Code available for payment source | Attending: Internal Medicine | Admitting: Internal Medicine

## 2021-12-22 ENCOUNTER — Other Ambulatory Visit: Payer: Self-pay

## 2021-12-22 ENCOUNTER — Encounter: Payer: Self-pay | Admitting: Emergency Medicine

## 2021-12-22 DIAGNOSIS — T8149XA Infection following a procedure, other surgical site, initial encounter: Secondary | ICD-10-CM

## 2021-12-22 MED ORDER — CEPHALEXIN 500 MG PO CAPS: 500.0000 mg | ORAL_CAPSULE | Freq: Three times a day (TID) | ORAL | 0 refills | Status: AC

## 2021-12-22 NOTE — ED Provider Notes (Signed)
[facility]    CSN: [id number] Arrival date & time: 12/22/21  1650      History   Chief Complaint Chief Complaint  Patient presents with   Wound Care    HPI Austin Rubio is a 46 y.o. male.   Patient presents with concerns for problems with his surgical site to the chest.  Patient reports that he had a tumor removed from his chest approximately 4 weeks ago.  He noticed approximately 1 week ago that the surgical site was starting to come apart.  Denies any fevers, body aches, chills.  Patient last saw surgeon approximately 8 days ago.  He reported that he cannot get in with his surgeon today so he was told to come to urgent care.    Past Medical History:  Diagnosis Date   Complication of anesthesia    syncope after bgetting home from surgery.    Hyperlipidemia    Pre-diabetes    Unspecified essential hypertension 05/17/2013    Patient Active Problem List   Diagnosis Date Noted   Erectile dysfunction 07/08/2017   Hyperlipidemia 07/08/2017   Hematuria 03/18/2017   Dyslipidemia 06/22/2013   Essential hypertension 05/17/2013   Chronic constipation 05/17/2013   Hemorrhoids 05/17/2013    Past Surgical History:  Procedure Laterality Date   HERNIA REPAIR     MELANOMA EXCISION Left 11/24/2021   Procedure: WIDE EXCISION OF LEFT CHEST SPINDLE CELL NEOPLASM;  Surgeon: Donnie Mesa, MD;  Location: [facility];  Service: General;  Laterality: Left;  LMA   XI ROBOTIC ASSISTED VENTRAL HERNIA N/A 03/06/2020   Procedure: XI ROBOTIC ASSISTED VENTRAL HERNIA;  Surgeon: Jules Husbands, MD;  Location: [facility];  Service: General;  Laterality: N/A;       Home Medications    Prior to Admission medications   Medication Sig Start Date End Date Taking? Authorizing Provider  amLODipine (NORVASC) 10 MG tablet TAKE 1 TABLET (10 MG TOTAL) BY MOUTH DAILY. 10/02/21 01/05/22 Yes Gildardo Pounds, NP  atorvastatin (LIPITOR) 10 MG tablet TAKE 1 TABLET (10 MG TOTAL) BY  MOUTH DAILY. 10/16/21 01/14/22 Yes Charlott Rakes, MD  cephALEXin (KEFLEX) 500 MG capsule Take 1 capsule (500 mg total) by mouth 3 (three) times daily for 7 days. 12/22/21 12/29/21 Yes Kori Goins, Michele Rockers, FNP  losartan (COZAAR) 50 MG tablet Take 1 tablet (50 mg total) by mouth daily. 10/02/21 01/05/22 Yes Gildardo Pounds, NP  metFORMIN (GLUCOPHAGE) 500 MG tablet Take 2 tablets (1,000 mg total) by mouth daily with breakfast. 10/02/21 01/05/22 Yes Gildardo Pounds, NP  oxyCODONE (OXY IR/ROXICODONE) 5 MG immediate release tablet Take 1 tablet (5 mg total) by mouth every 6 (six) hours as needed for severe pain. 11/24/21  Yes Donnie Mesa, MD    Family History Family History  Problem Relation Age of Onset   Diabetes Mother    Heart disease Father     Social History Social History   Tobacco Use   Smoking status: Never   Smokeless tobacco: Never  Vaping Use   Vaping Use: Never used  Substance Use Topics   Alcohol use: No    Comment: socially   Drug use: No     Allergies   Patient has no known allergies.   Review of Systems Review of Systems Per HPI  Physical Exam Triage Vital Signs ED Triage Vitals [12/22/21 1715]  Enc Vitals Group     BP (!) 159/91     Pulse Rate 87  Resp 20     Temp 98.1 F (36.7 C)     Temp Source Oral     SpO2 96 %     Weight      Height      Head Circumference      Peak Flow      Pain Score 0     Pain Loc      Pain Edu?      Excl. in [place]?    No data found.  Updated Vital Signs BP (!) 159/91 (BP Location: Left Arm)    Pulse 87    Temp 98.1 F (36.7 C) (Oral)    Resp 20    SpO2 96%   Visual Acuity Right Eye Distance:   Left Eye Distance:   Bilateral Distance:    Right Eye Near:   Left Eye Near:    Bilateral Near:     Physical Exam Constitutional:      General: He is not in acute distress.    Appearance: Normal appearance. He is not toxic-appearing or diaphoretic.  HENT:     Head: Normocephalic and atraumatic.  Eyes:     Extraocular  Movements: Extraocular movements intact.     Conjunctiva/sclera: Conjunctivae normal.  Pulmonary:     Effort: Pulmonary effort is normal.  Skin:    Comments: Patient has approximately 4 inch surgical site to left upper chest.  There is an opening with possible tunneling located to left lateral area of surgical site.  There is purulent drainage coming from this area as well surrounding erythema.  Neurological:     General: No focal deficit present.     Mental Status: He is alert and oriented to person, place, and time. Mental status is at baseline.  Psychiatric:        Mood and Affect: Mood normal.        Behavior: Behavior normal.        Thought Content: Thought content normal.        Judgment: Judgment normal.     UC Treatments / Results  Labs (all labs ordered are listed, but only abnormal results are displayed) Labs Reviewed - No data to display  EKG   Radiology No results found.  Procedures Procedures (including critical care time)  Medications Ordered in UC Medications - No data to display  Initial Impression / Assessment and Plan / UC Course  I have reviewed the triage vital signs and the nursing notes.  Pertinent labs & imaging results that were available during my care of the patient were reviewed by me and considered in my medical decision making (see chart for details).     Surgical site appears to be infected.  Will treat with cephalexin antibiotic.  There appears to be possible tunneling as well.  Patient was advised that he would need to follow-up with his general surgeon as soon as possible for further evaluation and management.  Discussed clinical course with supervising physician who was in agreeance.  Patient voiced understanding and was agreeable with plan.  Patient declined interpreter throughout patient interaction and wished for daughter to interpret. Final Clinical Impressions(s) / UC Diagnoses   Final diagnoses:  Surgical site infection      Discharge Instructions      Surgical site is infected.  You are being treated with antibiotics to treat this.  Please monitor blood sugars closely while on antibiotic as this can cause increased blood sugar when you are taking metformin.  Follow-up with surgeon as  soon as possible.    ED Prescriptions     Medication Sig Dispense Auth. Provider   cephALEXin (KEFLEX) 500 MG capsule Take 1 capsule (500 mg total) by mouth 3 (three) times daily for 7 days. 21 capsule [place], Michele Rockers, [place]      PDMP not reviewed this encounter.   Teodora Medici,  12/22/21 1827

## 2021-12-22 NOTE — ED Triage Notes (Signed)
Patient had surgery 4 weeks ago, the incision site over left chest area is opening up.  Per patient's surgeon, he was instructed to come to UC to be seen and treated.

## 2021-12-22 NOTE — Discharge Instructions (Signed)
Surgical site is infected.  You are being treated with antibiotics to treat this.  Please monitor blood sugars closely while on antibiotic as this can cause increased blood sugar when you are taking metformin.  Follow-up with surgeon as soon as possible.

## 2021-12-22 NOTE — Telephone Encounter (Signed)
° °  Chief Complaint: Surgery 11/24/21, surgeon cannot see pt. Told him to call PCP. 1 inch opening to incision line. Symptoms: Draining clear fluid. Frequency: Last week Pertinent Negatives: Patient denies fever Disposition: [] ED /[] Urgent Care (no appt availability in office) / [] Appointment(In office/virtual)/ []  [facility]/ [] Home Care/ [] Refused Recommended Disposition /[] [facility]/ []  Follow-up with PCP Additional Notes: Going to [place].  Reason for Disposition  [1] Clear or blood-tinged fluid draining from wound AND [2] no fever  Answer Assessment - Initial Assessment Questions 1. SYMPTOM: "What's the main symptom you're concerned about?" (e.g., drainage, incision opening up, pain, redness)     1 inch opening to site 2. ONSET: "When did opening  start?"     2 weeks 3. SURGERY: "What surgery did you have?"     Removed a mass 4. DATE of SURGERY: "When was the surgery?"      11/24/21 5. INCISION SITE: "Where is the incision located?"      Left side of chest 6. REDNESS: "Is there any redness at the incision site?" If yes, ask: "How wide across is the redness?" (Inches, centimeters)      No 7. PAIN: "Is there any pain?" If Yes, ask: "How bad is it?"  (Scale 1-10; or mild, moderate, severe)   - NONE (0): no pain   - MILD (1-3): doesn't interfere with normal activities    - MODERATE (4-7): interferes with normal activities or awakens from sleep    - SEVERE (8-10): excruciating pain, unable to do any normal activities     Mild 8. BLEEDING: "Is there any bleeding?" If Yes, ask: "How much?" and "Where?"     No 9. DRAINAGE: "Is there any drainage from the incision site?" If yes, ask: "What color and how much?" (e.g., red, cloudy, pus; drops, teaspoon)     Clear 10. FEVER: "Do you have a fever?" If Yes, ask: "What is your temperature, how was it measured, and when did it start?"       No 11. OTHER SYMPTOMS: "Do you have any other symptoms?" (e.g.,  dizziness, rash elsewhere on body, shaking chills, weakness)       No  Protocols used: Post-Op Incision Symptoms and Questions-A-AH

## 2021-12-23 ENCOUNTER — Encounter (HOSPITAL_COMMUNITY): Payer: Self-pay

## 2021-12-25 DIAGNOSIS — C4499 Other specified malignant neoplasm of skin, unspecified: Secondary | ICD-10-CM | POA: Insufficient documentation

## 2021-12-28 ENCOUNTER — Telehealth: Payer: Self-pay | Admitting: Radiation Oncology

## 2021-12-28 NOTE — Telephone Encounter (Signed)
LVM to sched consult with Dr. Moody °

## 2022-01-04 NOTE — Progress Notes (Signed)
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 ?Austin Rubio presented with a 10 year history of a slowly enlarging mass in the left chest. ? ?Mammogram/ [place] 11/2021: No breast mass noted.  Noted on ultrasound was a 5.4 x 3.0 x 4.3 cm cicumscribed oval heterogeneous mass containing internal vascular flow in the left chest at 10:00, 12 cm from the nipple. ? ?Ultrasound 03/2021: 2.9 x 2.6 x 1.3 cm oval hypoechoic mass with internal vascularity.  Axilla was negative. ? ? ?Histology per Pathology Report: Left Chest Wall Mass 11/24/2021 ? ? ?Surgeon and surgical plan, if any:  ?Dr. Georgette Dover 12/25/2021 ?-I spent some time with the patient and his daughter discussing his diagnosis. I explained that this is a very rare cancer with a tendency for recurrence. We will refer him to radiation oncology to discuss possible radiation therapy decrease his risk of recurrence.  ?-He may continue working. He should treat the open end of the wound with antibiotic ointment covered by a Band-Aid. ?-Return in about 3 weeks (around 01/15/2022) for wound check. ?-Follow-up 01/18/2022 ? ? ?Medical oncologist, treatment if any:   ? ? ?Pain issues, if any: Denies pain.  Reports he is healing but the incision still has a small open area.  He has occasional small amounts of drainage from his incision.  He is applying neosporin.   ? ?SAFETY ISSUES: ?Prior radiation? No ?Pacemaker/ICD? No ?Possible current pregnancy? N/A ?Is the patient on methotrexate? No ? ?Current Complaints / other details:   ? ?

## 2022-01-04 NOTE — Progress Notes (Signed)
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   HISTORY OF PRESENT ILLNESS: Austin Rubio is a 46 y.o. male seen at the request of Dr. 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 He apparently had a slowly enlarging mass in the left chest wall that was noted over a 10 year period. He underwent evaluation for this in May 2022 showing a 2.9 cm mass in the left chest wall. He delayed further work up and mammogram in January 2023 showed persistance of this and by ultrasound this measured 5.4 cm in the 10:00 position an dhis axilla was negative. Biopsy was consistent with a spindle cell neoplasm. He underwent wide local excision with Dr. Georgette Dover on 11/24/21 that showed a 6 cm dermatofibroma protuberans that was diagnosed on outside pathology review in [place] at [place] [facility] [place]. Margins were not specifically called out but the report said the "excision was marginal". He's seen to discuss adjuvant radiotherapy to reduce risks of local recurrence.    PREVIOUS RADIATION THERAPY: No   PAST MEDICAL HISTORY:  Past Medical History:  Diagnosis Date   Complication of anesthesia    syncope after bgetting home from surgery.    Hyperlipidemia    Pre-diabetes    Unspecified essential hypertension 05/17/2013       PAST SURGICAL HISTORY: Past Surgical History:  Procedure Laterality Date   HERNIA REPAIR     MELANOMA EXCISION Left 11/24/2021   Procedure: WIDE EXCISION OF LEFT CHEST SPINDLE CELL NEOPLASM;  Surgeon: Donnie Mesa, MD;  Location: [facility];  Service: General;  Laterality: Left;  LMA   XI ROBOTIC ASSISTED VENTRAL HERNIA N/A 03/06/2020   Procedure: XI ROBOTIC ASSISTED VENTRAL HERNIA;  Surgeon: Jules Husbands, MD;  Location: [facility];  Service: General;  Laterality: N/A;     FAMILY HISTORY:  Family History  Problem Relation Age of Onset   Diabetes Mother    Heart disease Father      SOCIAL HISTORY:  reports that he has never smoked. He has never used smokeless tobacco. He reports that he does not drink alcohol and does not use drugs. The patient is married and lives in [place]. He and his wife have 3 adult children, and 2 grand children. He works in Veterinary surgeon, and is originally from [place]. He does not have Naval architect.    ALLERGIES: Patient has no known allergies.   MEDICATIONS:  Current Outpatient Medications  Medication Sig Dispense Refill   amLODipine (NORVASC) 10 MG tablet TAKE 1 TABLET (10 MG TOTAL) BY MOUTH DAILY. 90 tablet 1   atorvastatin (LIPITOR) 10 MG tablet TAKE 1 TABLET (10 MG TOTAL) BY MOUTH DAILY. 90 tablet 1   losartan (COZAAR) 50 MG tablet Take 1 tablet (50 mg total) by mouth daily. 90 tablet 1   metFORMIN (GLUCOPHAGE) 500 MG tablet Take 2 tablets (1,000 mg total) by mouth daily with breakfast. 180 tablet 1   oxyCODONE (OXY IR/ROXICODONE) 5 MG immediate release tablet Take 1 tablet (5 mg total) by mouth  every 6 (six) hours as needed for severe pain. 15 tablet 0   No current facility-administered medications for this encounter.     REVIEW OF SYSTEMS: On review of systems, the patient reports that he is doing well since his surgery but states he did have some purulent drainage from the incision about 2 weeks ago and it is still open but the wound is slowly getting smaller. No other complaints are noted.   PHYSICAL EXAM:  Wt Readings from Last 3 Encounters:  01/05/22 204 lb (92.5 kg)  11/24/21 205 lb 4 oz (93.1 kg)  10/02/21 241 lb 4 oz (109.4 kg)    Temp Readings from Last 3 Encounters:  01/05/22 (!) 97.2 F (36.2 C) (Temporal)  12/22/21 98.1 F (36.7 C) (Oral)  11/24/21 98 F (36.7 C)   BP Readings from Last 3 Encounters:  01/05/22 135/70  12/22/21 (!) 159/91  11/24/21 (!) 158/99   Pulse Readings from Last 3 Encounters:  01/05/22 73  12/22/21 87  11/24/21 72   Pain Assessment Pain Score: 0-No pain/10  In general this is a well appearing Hispanic male in no acute distress. He's alert and oriented x4 and appropriate throughout the examination. Cardiopulmonary assessment is negative for acute distress and he exhibits normal effort. His incision site has two open areas separated by a band of tissue connecting the upper and lower aspects of his incision. Each opening is about 1 cm in greatest dimension with tunneling below the band of tissue, and fibrin is noted in the wound bed. This is changed and a dry 2x2 gauze is applied and secured with paper tape. No erythema or purulent matter is noted.     ECOG = 1  0 - Asymptomatic (Fully active, able to carry on all predisease activities without restriction)  1 - Symptomatic but completely ambulatory (Restricted in physically strenuous activity but ambulatory and able to carry out work of a light or sedentary nature. For example, light housework, office work)  2 - Symptomatic, <50% in bed during the day (Ambulatory and capable of all self care but unable to carry out any work activities. Up and about more than 50% of waking hours)  3 - Symptomatic, >50% in bed, but not bedbound (Capable of only limited self-care, confined to bed or chair 50% or more of waking hours)  4 - Bedbound (Completely disabled. Cannot carry on any self-care. Totally confined to bed or chair)  5 - Death   Eustace Pen MM, Creech RH, Tormey DC, et al. [phone]). "Toxicity and response criteria of the [facility]". [place] Oncol. 5 (6): 649-55    LABORATORY DATA:  Lab Results   Component Value Date   WBC 7.0 07/14/2021   HGB 16.0 07/14/2021   HCT 46.6 07/14/2021   MCV 86 07/14/2021   PLT 304 07/14/2021   Lab Results  Component Value Date   NA 141 11/23/2021   K 4.2 11/23/2021   CL 109 11/23/2021   CO2 26 11/23/2021   Lab Results  Component Value Date   ALT 57 (H) 07/14/2021   AST 36 07/14/2021   ALKPHOS 100 07/14/2021   BILITOT 0.4 07/14/2021      RADIOGRAPHY: No results found.     IMPRESSION/PLAN: 1. 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 Dr. Lisbeth Renshaw discusses the pathology findings and reviews the nature of soft tissue cancers. We will follow up with pathology to see if they can give further comments on the status of his margins.  Dr. Georgette Dover indicated to the patient that more surgery would be difficult to achieve primary closure, but we will reach out to him to confirm that once we know more feedback from pathology. Dr. Lisbeth Renshaw reviewed the rationale for radiotherapy to reduce risks of local recurrence.  We discussed the risks, benefits, short, and long term effects of radiotherapy, as well as the curative intent, and the patient is interested in proceeding. Dr. Lisbeth Renshaw discusses the delivery and logistics of radiotherapy and anticipates a course of 6 weeks of radiotherapy to the left chest wall. Written consent is obtained and placed in the chart, a copy was provided to the patient. He will be contacted to coordinate simulation by our staff once we have more clarity from pathology. We will also refer him to medical oncology to ensure he doesn't need systemic therapy, and for long term follow up.  2. Financial Concerns. The patient does not have health insurance. I will refer him to social work to see if there are any resources to assist during his care.  In a visit lasting 60 minutes, greater than 50% of the time was spent face to face discussing the patient's condition, in preparation for the discussion, and coordinating the patient's care.   The  above documentation reflects my direct findings during this shared patient visit. Please see the separate note by Dr. Lisbeth Renshaw on this date for the remainder of the patient's plan of care.    Carola Rhine, [facility]   **Disclaimer: This note was dictated with voice recognition software. Similar sounding words can inadvertently be transcribed and this note may contain transcription errors which may not have been corrected upon publication of note.**

## 2022-01-05 ENCOUNTER — Ambulatory Visit
Admission: RE | Admit: 2022-01-05 | Discharge: 2022-01-05 | Disposition: A | Discharge status: Home / Self Care | Payer: Self-pay | Source: Ambulatory Visit | Attending: Radiation Oncology | Admitting: Radiation Oncology

## 2022-01-05 ENCOUNTER — Encounter: Payer: Self-pay | Admitting: Radiation Oncology

## 2022-01-05 ENCOUNTER — Other Ambulatory Visit: Payer: Self-pay

## 2022-01-05 VITALS — BP 135/70 | HR 73 | Temp 97.2°F | Resp 18 | Ht 65.0 in | Wt 204.0 lb

## 2022-01-05 DIAGNOSIS — C44599 Other specified malignant neoplasm of skin of other part of trunk: Secondary | ICD-10-CM

## 2022-01-05 DIAGNOSIS — Z79899 Other long term (current) drug therapy: Secondary | ICD-10-CM | POA: Insufficient documentation

## 2022-01-05 DIAGNOSIS — Z7984 Long term (current) use of oral hypoglycemic drugs: Secondary | ICD-10-CM | POA: Insufficient documentation

## 2022-01-05 DIAGNOSIS — Z85820 Personal history of malignant melanoma of skin: Secondary | ICD-10-CM | POA: Insufficient documentation

## 2022-01-05 DIAGNOSIS — I10 Essential (primary) hypertension: Secondary | ICD-10-CM | POA: Insufficient documentation

## 2022-01-05 DIAGNOSIS — E785 Hyperlipidemia, unspecified: Secondary | ICD-10-CM | POA: Insufficient documentation

## 2022-01-05 DIAGNOSIS — C496 Malignant neoplasm of connective and soft tissue of trunk, unspecified: Secondary | ICD-10-CM

## 2022-01-06 ENCOUNTER — Other Ambulatory Visit: Payer: Self-pay | Admitting: Radiation Oncology

## 2022-01-06 DIAGNOSIS — C496 Malignant neoplasm of connective and soft tissue of trunk, unspecified: Secondary | ICD-10-CM

## 2022-01-07 ENCOUNTER — Encounter: Payer: Self-pay | Admitting: Licensed Clinical Social Worker

## 2022-01-07 ENCOUNTER — Telehealth: Payer: Self-pay | Admitting: Oncology

## 2022-01-07 NOTE — Progress Notes (Signed)
[place] Clinical Social Work  ?Initial Assessment ? ? ?Austin Rubio is a 46 y.o. year old male contacted by phone with interpreter assistance. Clinical Social Work was referred by  radiation oncology  for assessment of psychosocial needs.  ? ?SDOH (Social Determinants of Health) assessments performed: Yes ?SDOH Interventions   ? ?Flowsheet Row Most Recent Value  ?SDOH Interventions   ?Food Insecurity Interventions Intervention Not Indicated  ?Financial Strain Interventions Other (Comment)  [already has CAFA,  cancer-specific grants]  ?Housing Interventions Intervention Not Indicated  ?Transportation Interventions Intervention Not Indicated  ? ?  ?  ?Distress Screen completed: Yes ?ONCBCN DISTRESS SCREENING 01/05/2022  ?Screening Type Initial Screening  ?Distress experienced in past week (1-10) 2  ?Practical problem type Insurance  ?Spiritual/Religous concerns type Relating to God;Loss of Faith;Facing my mortality;Loss of sense of purpose  ?Information Concerns Type Lack of info about diagnosis;Lack of info about treatment;Lack of info about complementary therapy choices;Lack of info about maintaining fitness  ?Physical Problem type Pain  ? ? ? ? ?Family/Social Information:  ?Housing Arrangement: patient lives with wife, adult children ?Family members/support persons in your life? Family ?Transportation concerns: no, wife helps drive as needed  ?Employment: works in Architect. Income source: Employment ?Financial concerns:  Yes- no insurance due to citizenship status ?Type of concern: Medical bills ?Food access concerns: no, wife receives SNAP ?Religious or spiritual practice: not addressed ?Services Currently in place:  SNAP (wife), [employer]  (CAFA)- 75%, orange card ? ?Coping/ Adjustment to diagnosis: ?Patient understands treatment plan and what happens next? Is still learning about his plan- will meet with med onc on 01/20/22 ?Concerns about diagnosis and/or treatment: How I will pay for the  services I need ?Patient reported stressors: Insurance ?Current coping skills/ strengths: Motivation for treatment/growth  and Supportive family/friends  ? ? ? SUMMARY: ?Current SDOH Barriers:  ?Financial constraints related to lack of insurance ? ?Clinical Social Work Clinical Goal(s):  ?Patient will work with CSW to access any additional financial supports ? ?Interventions: ?Informed patient of the support team roles and support services at [facility] ?Provided patient with information about additional potential supports [employer], Proofreader) ? ? ?Follow Up Plan: CSW will see patient on 01/20/2022 to sign up for [employer] and give [facility] information ?Patient verbalizes understanding of plan: Yes ? ? ? ?Pia Jedlicka E Jamina Macbeth, LCSW ?

## 2022-01-07 NOTE — Telephone Encounter (Signed)
Scheduled appt per 3/8 referral. Used interpreter services. Pt is aware of appt date and time. Pt is aware to arrive 15 mins prior to appt time and to bring and updated insurance card. Pt is aware of appt location.   ?

## 2022-01-12 ENCOUNTER — Other Ambulatory Visit: Payer: Self-pay

## 2022-01-13 ENCOUNTER — Other Ambulatory Visit: Payer: Self-pay

## 2022-01-20 ENCOUNTER — Inpatient Hospital Stay: Payer: Self-pay | Admitting: Licensed Clinical Social Worker

## 2022-01-20 ENCOUNTER — Telehealth: Payer: Self-pay | Admitting: *Deleted

## 2022-01-20 ENCOUNTER — Ambulatory Visit: Payer: Self-pay | Admitting: Radiation Oncology

## 2022-01-20 ENCOUNTER — Inpatient Hospital Stay: Payer: Self-pay | Admitting: Oncology

## 2022-01-20 NOTE — Telephone Encounter (Signed)
Contacted interpreter Almyra Free, and notified pt of missed appt. Pt had appt's mixed up and only thought he had a 3/29 appt available. Pt was rescheduled for 3/24 and is aware ?

## 2022-01-21 ENCOUNTER — Telehealth: Payer: Self-pay | Admitting: Oncology

## 2022-01-21 NOTE — Telephone Encounter (Signed)
R/s pt's new pt appt with Dr. Alen Blew per 3/23 staff msg from MD. Hulen Skains pt using the interpreter services, no answer and either number. Left a msg letting pt know of appt change and new appt date and time.  ?

## 2022-01-22 ENCOUNTER — Inpatient Hospital Stay: Payer: Self-pay | Admitting: Oncology

## 2022-01-22 ENCOUNTER — Other Ambulatory Visit: Payer: Self-pay

## 2022-01-26 ENCOUNTER — Telehealth: Payer: Self-pay | Admitting: Radiation Oncology

## 2022-01-26 LAB — SURGICAL PATHOLOGY

## 2022-01-26 NOTE — Telephone Encounter (Signed)
I called the patient with the help of the spanish interpretor through [employer] to try to review pathology. He was not available but we will communicate with him next week during simulation that pathology did confirm that he has a positive anterior margin from surgery, which is felt to be the only positive margin.  ?

## 2022-01-27 ENCOUNTER — Ambulatory Visit: Payer: Self-pay | Admitting: Radiation Oncology

## 2022-01-29 ENCOUNTER — Other Ambulatory Visit: Payer: Self-pay

## 2022-01-29 DIAGNOSIS — R7303 Prediabetes: Secondary | ICD-10-CM

## 2022-01-29 DIAGNOSIS — E785 Hyperlipidemia, unspecified: Secondary | ICD-10-CM

## 2022-01-29 DIAGNOSIS — I10 Essential (primary) hypertension: Secondary | ICD-10-CM

## 2022-01-29 MED ORDER — ATORVASTATIN CALCIUM 10 MG PO TABS
ORAL_TABLET | Freq: Every day | ORAL | 1 refills | Status: DC
  Filled 2022-01-29: qty 90, 90d supply, fill #0
  Filled 2022-05-11: qty 90, 90d supply, fill #1

## 2022-01-29 MED ORDER — METFORMIN HCL 500 MG PO TABS
1000.0000 mg | ORAL_TABLET | Freq: Every day | ORAL | 1 refills | Status: DC
  Filled 2022-01-29: qty 180, 90d supply, fill #0

## 2022-01-29 MED ORDER — AMLODIPINE BESYLATE 10 MG PO TABS
ORAL_TABLET | Freq: Every day | ORAL | 1 refills | Status: DC
  Filled 2022-01-29: qty 90, 90d supply, fill #0
  Filled 2022-05-11: qty 90, 90d supply, fill #1

## 2022-01-29 MED ORDER — LOSARTAN POTASSIUM 50 MG PO TABS
50.0000 mg | ORAL_TABLET | Freq: Every day | ORAL | 1 refills | Status: DC
  Filled 2022-01-29: qty 90, 90d supply, fill #0
  Filled 2022-02-23: qty 30, 30d supply, fill #0
  Filled 2022-04-01: qty 30, 30d supply, fill #1
  Filled 2022-05-11: qty 90, 90d supply, fill #2
  Filled 2022-08-27: qty 30, 30d supply, fill #3

## 2022-02-02 ENCOUNTER — Ambulatory Visit
Admission: RE | Admit: 2022-02-02 | Discharge: 2022-02-02 | Disposition: A | Discharge status: Home / Self Care | Payer: Self-pay | Source: Ambulatory Visit | Attending: Radiation Oncology | Admitting: Radiation Oncology

## 2022-02-02 ENCOUNTER — Other Ambulatory Visit: Payer: Self-pay

## 2022-02-02 DIAGNOSIS — C496 Malignant neoplasm of connective and soft tissue of trunk, unspecified: Secondary | ICD-10-CM | POA: Insufficient documentation

## 2022-02-04 ENCOUNTER — Inpatient Hospital Stay: Payer: Self-pay | Attending: Oncology | Admitting: Oncology

## 2022-02-04 ENCOUNTER — Other Ambulatory Visit: Payer: Self-pay

## 2022-02-04 VITALS — BP 135/73 | HR 70 | Temp 97.8°F | Resp 16 | Ht 65.0 in | Wt 207.7 lb

## 2022-02-04 DIAGNOSIS — R222 Localized swelling, mass and lump, trunk: Secondary | ICD-10-CM

## 2022-02-04 DIAGNOSIS — Z833 Family history of diabetes mellitus: Secondary | ICD-10-CM

## 2022-02-04 DIAGNOSIS — Z923 Personal history of irradiation: Secondary | ICD-10-CM

## 2022-02-04 DIAGNOSIS — I10 Essential (primary) hypertension: Secondary | ICD-10-CM

## 2022-02-04 DIAGNOSIS — Z79899 Other long term (current) drug therapy: Secondary | ICD-10-CM

## 2022-02-04 DIAGNOSIS — C496 Malignant neoplasm of connective and soft tissue of trunk, unspecified: Secondary | ICD-10-CM

## 2022-02-04 DIAGNOSIS — E785 Hyperlipidemia, unspecified: Secondary | ICD-10-CM

## 2022-02-04 DIAGNOSIS — Z8249 Family history of ischemic heart disease and other diseases of the circulatory system: Secondary | ICD-10-CM

## 2022-02-04 NOTE — Progress Notes (Signed)
?Reason for the request:    Chest wall sarcoma ? ?HPI: I was asked by Dr. Lisbeth Renshaw to evaluate Austin Rubio for chest wall sarcoma.  He is a 46 year old man with no significant comorbid conditions other than hyperlipidemia who was found to have an enlarging left-sided chest wall mass over the last 10 years.  He was subsequently evaluated by Dr. Georgette Dover and ultrasound at that time 5.4 cm mass.  Biopsy obtained at that time which showed a spindle cell neoplasm.  He underwent wide local excision on November 24, 2021 which was completed without incident.  He did not have any postoperative complications.  The final pathology confirmed the presence of a 6 cm dermatofibrosarcoma protuberans.  The margins were positive and was referred to radiation for adjuvant therapy.  He was evaluated by Dr. Lisbeth Renshaw and currently set up to start adjuvant radiation therapy next week.  Clinically, he reports feeling well without any major complaints.  He denies any nausea, vomiting or abdominal pain.  He denies any skin rash or lesions.  He denies any chest wall pain or discomfort. ? ?He  does not report any headaches, blurry vision, syncope or seizures. Does not report any fevers, chills or sweats.  Does not report any cough, wheezing or hemoptysis.  Does not report any chest pain, palpitation, orthopnea or leg edema.  Does not report any nausea, vomiting or abdominal pain.  Does not report any constipation or diarrhea.  Does not report any skeletal complaints.    Does not report frequency, urgency or hematuria.  Does not report any skin rashes or lesions. Does not report any heat or cold intolerance.  Does not report any lymphadenopathy or petechiae.  Does not report any anxiety or depression.  Remaining review of systems is negative.  ? ? ? ?Past Medical History:  ?Diagnosis Date  ? Complication of anesthesia   ? syncope after bgetting home from surgery.   ? Hyperlipidemia   ? Pre-diabetes   ? Unspecified essential hypertension 05/17/2013   ?: ? ? ?Past Surgical History:  ?Procedure Laterality Date  ? HERNIA REPAIR    ? MELANOMA EXCISION Left 11/24/2021  ? Procedure: WIDE EXCISION OF LEFT CHEST SPINDLE CELL NEOPLASM;  Surgeon: Donnie Mesa, MD;  Location: [facility];  Service: General;  Laterality: Left;  LMA  ? XI ROBOTIC ASSISTED VENTRAL HERNIA N/A 03/06/2020  ? Procedure: XI ROBOTIC ASSISTED VENTRAL HERNIA;  Surgeon: Jules Husbands, MD;  Location: [facility];  Service: General;  Laterality: N/A;  ?: ? ? ?Current Outpatient Medications:  ?  amLODipine (NORVASC) 10 MG tablet, TAKE 1 TABLET (10 MG TOTAL) BY MOUTH DAILY., Disp: 90 tablet, Rfl: 1 ?  atorvastatin (LIPITOR) 10 MG tablet, TAKE 1 TABLET (10 MG TOTAL) BY MOUTH DAILY., Disp: 90 tablet, Rfl: 1 ?  losartan (COZAAR) 50 MG tablet, Take 1 tablet (50 mg total) by mouth daily., Disp: 90 tablet, Rfl: 1 ?  metFORMIN (GLUCOPHAGE) 500 MG tablet, Take 2 tablets (1,000 mg total) by mouth daily with breakfast., Disp: 180 tablet, Rfl: 1 ?  oxyCODONE (OXY IR/ROXICODONE) 5 MG immediate release tablet, Take 1 tablet (5 mg total) by mouth every 6 (six) hours as needed for severe pain., Disp: 15 tablet, Rfl: 0: ? ?No Known Allergies: ? ? ?Family History  ?Problem Relation Age of Onset  ? Diabetes Mother   ? Heart disease Father   ?: ? ? ?Social History  ? ?Socioeconomic History  ? Marital status: Married  ?  Spouse name: Not on file  ? Number of children: Not on file  ? Years of education: Not on file  ? Highest education level: Not on file  ?Occupational History  ? Not on file  ?Tobacco Use  ? Smoking status: Never  ? Smokeless tobacco: Never  ?Vaping Use  ? Vaping Use: Never used  ?Substance and Sexual Activity  ? Alcohol use: No  ?  Comment: socially  ? Drug use: No  ? Sexual activity: Yes  ?  Partners: Female  ?  Birth control/protection: None  ?Other Topics Concern  ? Not on file  ?Social History Narrative  ? Not on file  ? ?Social Determinants of Health  ? ?Financial Resource Strain: Medium  Risk  ? Difficulty of Paying Living Expenses: Somewhat hard  ?Food Insecurity: No Food Insecurity  ? Worried About Charity fundraiser in the Last Year: Never true  ? Ran Out of Food in the Last Year: Never true  ?Transportation Needs: No Transportation Needs  ? Lack of Transportation (Medical): No  ? Lack of Transportation (Non-Medical): No  ?Physical Activity: Not on file  ?Stress: Not on file  ?Social Connections: Not on file  ?Intimate Partner Violence: Not on file  ?: ? ?Pertinent items are noted in HPI. ? ?Exam: ?Blood pressure 135/73, pulse 70, temperature 97.8 ?F (36.6 ?C), temperature source Temporal, resp. rate 16, height '5\' 5"'$  (1.651 m), weight 207 lb 11.2 oz (94.2 kg), SpO2 100 %. ? ?ECOG 1 ?General appearance: alert and cooperative appeared without distress. ?Head: atraumatic without any abnormalities. ?Eyes: conjunctivae/corneas clear. PERRL.  Sclera anicteric. ?Throat: lips, mucosa, and tongue normal; without oral thrush or ulcers. ?Resp: clear to auscultation bilaterally without rhonchi, wheezes or dullness to percussion. ?Cardio: regular rate and rhythm, S1, S2 normal, no murmur, click, rub or gallop ?GI: soft, non-tender; bowel sounds normal; no masses,  no organomegaly ?Skin: No masses or lesions noted.  Chest wall incision appears to be well-healed. ?Lymph nodes: Cervical, supraclavicular, and axillary nodes normal. ?Neurologic: Grossly normal without any motor, sensory or deep tendon reflexes. ?Musculoskeletal: No joint deformity or effusion. ? ? ? ?Assessment and Plan:  ? ?46 year old male with: ? ?1.  Dermatofibrosarcoma protuberance confirmed in January 2023 after presenting with a 6 cm left-sided chest wall mass that has been growing over the last 10 years.  He is status post wide excision with positive margins and currently under evaluation to start adjuvant radiation therapy next week. ? ?The natural course of this disease was reviewed at this time and treatment choices were discussed.   This is considered a intermediate grade sarcoma that has propensity to recur locally but unlikely to spread to regional lymph nodes or cause a threat for metastatic disease.  Clinical surveillance is recommended after radiation therapy.  There is no role for any additional systemic therapy at this time in the adjuvant setting. ? ?Systemic therapy options if he developed recurrent locally advanced disease that is unresectable and not amendable radiation were reviewed.  Oral targeted therapy utilizing Gleevac remains the main option at this time.  Other options such as sunitinib, sorafenib and Votrient could be considered as well. ? ?At this time I have recommended a clinical monitoring at this time after completing radiation therapy. ? ?2.  Follow-up: Will be as needed in the future. ? ? ?45  minutes were dedicated to this visit. The time was spent on reviewing pathology results, imaging studies, discussing treatment options,  and answering questions regarding future  plan. ? ? ? ?A copy of this consult has been forwarded to the requesting physician. ? ?

## 2022-02-09 ENCOUNTER — Ambulatory Visit
Admission: RE | Admit: 2022-02-09 | Discharge: 2022-02-09 | Disposition: A | Discharge status: Home / Self Care | Payer: Self-pay | Source: Ambulatory Visit | Attending: Radiation Oncology | Admitting: Radiation Oncology

## 2022-02-09 ENCOUNTER — Other Ambulatory Visit: Payer: Self-pay

## 2022-02-09 NOTE — Progress Notes (Signed)
Pt here for patient teaching.  Pt given Radiation and You booklet, skin care instructions, and Sonafine.  Reviewed areas of pertinence such as fatigue, hair loss, skin changes, throat changes, cough, and shortness of breath . Pt able to give teach back of to pat skin and use unscented/gentle soap,apply Sonafine bid and avoid applying anything to skin within 4 hours of treatment. Pt verbalizes understanding of information given and will contact nursing with any questions or concerns.   ?

## 2022-02-10 ENCOUNTER — Ambulatory Visit
Admission: RE | Admit: 2022-02-10 | Discharge: 2022-02-10 | Disposition: A | Discharge status: Home / Self Care | Payer: Self-pay | Source: Ambulatory Visit | Attending: Radiation Oncology | Admitting: Radiation Oncology

## 2022-02-11 ENCOUNTER — Ambulatory Visit
Admission: RE | Admit: 2022-02-11 | Discharge: 2022-02-11 | Disposition: A | Discharge status: Home / Self Care | Payer: Self-pay | Source: Ambulatory Visit | Attending: Radiation Oncology | Admitting: Radiation Oncology

## 2022-02-11 ENCOUNTER — Other Ambulatory Visit: Payer: Self-pay

## 2022-02-12 ENCOUNTER — Ambulatory Visit
Admission: RE | Admit: 2022-02-12 | Discharge: 2022-02-12 | Disposition: A | Discharge status: Home / Self Care | Payer: Self-pay | Source: Ambulatory Visit | Attending: Radiation Oncology | Admitting: Radiation Oncology

## 2022-02-12 DIAGNOSIS — C496 Malignant neoplasm of connective and soft tissue of trunk, unspecified: Secondary | ICD-10-CM

## 2022-02-12 MED ORDER — SONAFINE EX EMUL
1.0000 "application " | Freq: Two times a day (BID) | CUTANEOUS | Status: DC
  Administered 2022-02-12: 1 via TOPICAL

## 2022-02-15 ENCOUNTER — Ambulatory Visit
Admission: RE | Admit: 2022-02-15 | Discharge: 2022-02-15 | Disposition: A | Discharge status: Home / Self Care | Payer: Self-pay | Source: Ambulatory Visit | Attending: Radiation Oncology | Admitting: Radiation Oncology

## 2022-02-15 ENCOUNTER — Other Ambulatory Visit: Payer: Self-pay

## 2022-02-16 ENCOUNTER — Other Ambulatory Visit: Payer: Self-pay

## 2022-02-16 ENCOUNTER — Ambulatory Visit
Admission: RE | Admit: 2022-02-16 | Discharge: 2022-02-16 | Disposition: A | Discharge status: Home / Self Care | Payer: Self-pay | Source: Ambulatory Visit | Attending: Radiation Oncology | Admitting: Radiation Oncology

## 2022-02-16 LAB — RAD ONC ARIA SESSION SUMMARY
Course Elapsed Days: 7
Plan Fractions Treated to Date: 6
Plan Prescribed Dose Per Fraction: 2 Gy
Plan Total Fractions Prescribed: 25
Plan Total Prescribed Dose: 50 Gy
Reference Point Dosage Given to Date: 12 Gy
Reference Point Session Dosage Given: 2 Gy
Session Number: 6

## 2022-02-17 ENCOUNTER — Ambulatory Visit
Admission: RE | Admit: 2022-02-17 | Discharge: 2022-02-17 | Disposition: A | Discharge status: Home / Self Care | Payer: Self-pay | Source: Ambulatory Visit | Attending: Radiation Oncology | Admitting: Radiation Oncology

## 2022-02-17 ENCOUNTER — Other Ambulatory Visit: Payer: Self-pay

## 2022-02-17 LAB — RAD ONC ARIA SESSION SUMMARY
Course Elapsed Days: 8
Plan Fractions Treated to Date: 7
Plan Prescribed Dose Per Fraction: 2 Gy
Plan Total Fractions Prescribed: 25
Plan Total Prescribed Dose: 50 Gy
Reference Point Dosage Given to Date: 14 Gy
Reference Point Session Dosage Given: 2 Gy
Session Number: 7

## 2022-02-18 ENCOUNTER — Other Ambulatory Visit: Payer: Self-pay

## 2022-02-18 ENCOUNTER — Ambulatory Visit
Admission: RE | Admit: 2022-02-18 | Discharge: 2022-02-18 | Disposition: A | Discharge status: Home / Self Care | Payer: Self-pay | Source: Ambulatory Visit | Attending: Radiation Oncology | Admitting: Radiation Oncology

## 2022-02-18 LAB — RAD ONC ARIA SESSION SUMMARY
Course Elapsed Days: 9
Plan Fractions Treated to Date: 8
Plan Prescribed Dose Per Fraction: 2 Gy
Plan Total Fractions Prescribed: 25
Plan Total Prescribed Dose: 50 Gy
Reference Point Dosage Given to Date: 16 Gy
Reference Point Session Dosage Given: 2 Gy
Session Number: 8

## 2022-02-19 ENCOUNTER — Ambulatory Visit
Admission: RE | Admit: 2022-02-19 | Discharge: 2022-02-19 | Disposition: A | Discharge status: Home / Self Care | Payer: Self-pay | Source: Ambulatory Visit | Attending: Radiation Oncology | Admitting: Radiation Oncology

## 2022-02-19 ENCOUNTER — Other Ambulatory Visit: Payer: Self-pay

## 2022-02-19 LAB — RAD ONC ARIA SESSION SUMMARY
Course Elapsed Days: 10
Plan Fractions Treated to Date: 9
Plan Prescribed Dose Per Fraction: 2 Gy
Plan Total Fractions Prescribed: 25
Plan Total Prescribed Dose: 50 Gy
Reference Point Dosage Given to Date: 18 Gy
Reference Point Session Dosage Given: 2 Gy
Session Number: 9

## 2022-02-22 ENCOUNTER — Ambulatory Visit
Admission: RE | Admit: 2022-02-22 | Discharge: 2022-02-22 | Disposition: A | Discharge status: Home / Self Care | Payer: Self-pay | Source: Ambulatory Visit | Attending: Radiation Oncology | Admitting: Radiation Oncology

## 2022-02-22 ENCOUNTER — Other Ambulatory Visit: Payer: Self-pay

## 2022-02-22 LAB — RAD ONC ARIA SESSION SUMMARY
Course Elapsed Days: 13
Plan Fractions Treated to Date: 10
Plan Prescribed Dose Per Fraction: 2 Gy
Plan Total Fractions Prescribed: 25
Plan Total Prescribed Dose: 50 Gy
Reference Point Dosage Given to Date: 20 Gy
Reference Point Session Dosage Given: 2 Gy
Session Number: 10

## 2022-02-23 ENCOUNTER — Other Ambulatory Visit: Payer: Self-pay

## 2022-02-23 ENCOUNTER — Ambulatory Visit
Admission: RE | Admit: 2022-02-23 | Discharge: 2022-02-23 | Disposition: A | Discharge status: Home / Self Care | Payer: Self-pay | Source: Ambulatory Visit | Attending: Radiation Oncology | Admitting: Radiation Oncology

## 2022-02-23 LAB — RAD ONC ARIA SESSION SUMMARY
Course Elapsed Days: 14
Plan Fractions Treated to Date: 11
Plan Prescribed Dose Per Fraction: 2 Gy
Plan Total Fractions Prescribed: 25
Plan Total Prescribed Dose: 50 Gy
Reference Point Dosage Given to Date: 22 Gy
Reference Point Session Dosage Given: 2 Gy
Session Number: 11

## 2022-02-24 ENCOUNTER — Ambulatory Visit
Admission: RE | Admit: 2022-02-24 | Discharge: 2022-02-24 | Disposition: A | Discharge status: Home / Self Care | Payer: Self-pay | Source: Ambulatory Visit | Attending: Radiation Oncology | Admitting: Radiation Oncology

## 2022-02-24 ENCOUNTER — Other Ambulatory Visit: Payer: Self-pay

## 2022-02-24 LAB — RAD ONC ARIA SESSION SUMMARY
Course Elapsed Days: 15
Plan Fractions Treated to Date: 12
Plan Prescribed Dose Per Fraction: 2 Gy
Plan Total Fractions Prescribed: 25
Plan Total Prescribed Dose: 50 Gy
Reference Point Dosage Given to Date: 24 Gy
Reference Point Session Dosage Given: 2 Gy
Session Number: 12

## 2022-02-25 ENCOUNTER — Other Ambulatory Visit: Payer: Self-pay

## 2022-02-25 ENCOUNTER — Ambulatory Visit
Admission: RE | Admit: 2022-02-25 | Discharge: 2022-02-25 | Disposition: A | Discharge status: Home / Self Care | Payer: Self-pay | Source: Ambulatory Visit | Attending: Radiation Oncology | Admitting: Radiation Oncology

## 2022-02-25 LAB — RAD ONC ARIA SESSION SUMMARY
Course Elapsed Days: 16
Plan Fractions Treated to Date: 13
Plan Prescribed Dose Per Fraction: 2 Gy
Plan Total Fractions Prescribed: 25
Plan Total Prescribed Dose: 50 Gy
Reference Point Dosage Given to Date: 26 Gy
Reference Point Session Dosage Given: 2 Gy
Session Number: 13

## 2022-02-26 ENCOUNTER — Other Ambulatory Visit: Payer: Self-pay

## 2022-02-26 ENCOUNTER — Ambulatory Visit
Admission: RE | Admit: 2022-02-26 | Discharge: 2022-02-26 | Disposition: A | Discharge status: Home / Self Care | Payer: Self-pay | Source: Ambulatory Visit | Attending: Radiation Oncology | Admitting: Radiation Oncology

## 2022-02-26 LAB — RAD ONC ARIA SESSION SUMMARY
Course Elapsed Days: 17
Plan Fractions Treated to Date: 14
Plan Prescribed Dose Per Fraction: 2 Gy
Plan Total Fractions Prescribed: 25
Plan Total Prescribed Dose: 50 Gy
Reference Point Dosage Given to Date: 28 Gy
Reference Point Session Dosage Given: 2 Gy
Session Number: 14

## 2022-03-01 ENCOUNTER — Other Ambulatory Visit: Payer: Self-pay

## 2022-03-01 ENCOUNTER — Encounter: Payer: Self-pay | Admitting: Nurse Practitioner

## 2022-03-01 ENCOUNTER — Ambulatory Visit
Admission: RE | Admit: 2022-03-01 | Discharge: 2022-03-01 | Disposition: A | Discharge status: Home / Self Care | Payer: Self-pay | Source: Ambulatory Visit | Attending: Radiation Oncology | Admitting: Radiation Oncology

## 2022-03-01 ENCOUNTER — Ambulatory Visit: Payer: Self-pay | Attending: Nurse Practitioner | Admitting: Nurse Practitioner

## 2022-03-01 VITALS — BP 134/79 | HR 72 | Resp 16 | Ht 66.0 in | Wt 206.0 lb

## 2022-03-01 DIAGNOSIS — R7303 Prediabetes: Secondary | ICD-10-CM

## 2022-03-01 DIAGNOSIS — I10 Essential (primary) hypertension: Secondary | ICD-10-CM

## 2022-03-01 DIAGNOSIS — C496 Malignant neoplasm of connective and soft tissue of trunk, unspecified: Secondary | ICD-10-CM | POA: Insufficient documentation

## 2022-03-01 DIAGNOSIS — G44209 Tension-type headache, unspecified, not intractable: Secondary | ICD-10-CM

## 2022-03-01 LAB — RAD ONC ARIA SESSION SUMMARY
Course Elapsed Days: 20
Plan Fractions Treated to Date: 15
Plan Prescribed Dose Per Fraction: 2 Gy
Plan Total Fractions Prescribed: 25
Plan Total Prescribed Dose: 50 Gy
Reference Point Dosage Given to Date: 30 Gy
Reference Point Session Dosage Given: 2 Gy
Session Number: 15

## 2022-03-01 LAB — POCT GLYCOSYLATED HEMOGLOBIN (HGB A1C): HbA1c, POC (prediabetic range): 5.8 % (ref 5.7–6.4)

## 2022-03-01 LAB — GLUCOSE, POCT (MANUAL RESULT ENTRY): POC Glucose: 171 mg/dl — AB (ref 70–99)

## 2022-03-01 MED ORDER — NAPROXEN 500 MG PO TABS
500.0000 mg | ORAL_TABLET | Freq: Two times a day (BID) | ORAL | 1 refills | Status: DC
  Filled 2022-03-01: qty 60, 30d supply, fill #0

## 2022-03-01 NOTE — Progress Notes (Signed)
? ?Assessment & Plan:  ?Austin Rubio was seen today for medication refill and eye problem. ? ?Diagnoses and all orders for this visit: ? ?Essential hypertension ?-     CMP14+EGFR ?Continue losartan and amlodipine as prescribed.  ?Remember to bring in your blood pressure log with you for your follow up appointment.  ?DASH/Mediterranean Diets are healthier choices for HTN.   ? ?Prediabetes ?-     Glucose (CBG) ?-     HgB A1c ?-     CMP14+EGFR ? ?Acute non intractable tension-type headache ?-     naproxen (NAPROSYN) 500 MG tablet; Take 1 tablet (500 mg total) by mouth 2 (two) times daily with a meal. ? ? ? ?Patient has been counseled on age-appropriate routine health concerns for screening and prevention. These are reviewed and up-to-date. Referrals have been placed accordingly. Immunizations are up-to-date or declined.    ?Subjective:  ? ?Chief Complaint  ?Patient presents with  ? Medication Refill  ? Eye Problem  ? ?HPI ?Austin Rubio 46 y.o. male presents to office today for follow up to HTN and prediabetes. ?He has a past medical history of Complication of anesthesia, Hyperlipidemia, Pre-diabetes, and HTN (05/17/2013).  ? ?VRI was used to communicate directly with patient for the entire encounter including providing detailed patient instructions.   ? ? ?HTN ?Blood pressure is well controlled with amlodipine 10 mg daily and losartan 50 mg daily.  ?BP Readings from Last 3 Encounters:  ?03/01/22 134/79  ?02/04/22 135/73  ?01/05/22 135/70  ?  ?He has subconjunctival hemorrhage of the right eye. Endorses persistent coughing over the weekend which resulted in redness of the outer corner of the right eye. He denies any acute visual changes or deficits.  ? ? ?Prediabetes ?Well controlled with 1000 mg metformin daily. LDL not at goal with atorvastatin 10 mg daily. Will need to increase to 20 mg daily.  ?Lab Results  ?Component Value Date  ? HGBA1C 5.8 03/01/2022  ?  ?Lab Results  ?Component Value Date  ? HGBA1C 6.1 (H)  07/14/2021  ? ?Lab Results  ?Component Value Date  ? LDLCALC 112 (H) 02/27/2021  ?   ? ?Headaches  ?Right occipital headaches are intermittent. Can last for hours however relieved with prn naproxen.  ?Review of Systems  ?Constitutional:  Negative for fever, malaise/fatigue and weight loss.  ?HENT: Negative.  Negative for nosebleeds.   ?Eyes:  Positive for redness. Negative for blurred vision, double vision, photophobia, pain and discharge.  ?Respiratory: Negative.  Negative for cough and shortness of breath.   ?Cardiovascular: Negative.  Negative for chest pain, palpitations and leg swelling.  ?Gastrointestinal: Negative.  Negative for heartburn, nausea and vomiting.  ?Musculoskeletal: Negative.  Negative for myalgias.  ?Neurological:  Positive for headaches. Negative for dizziness, focal weakness and seizures.  ?Psychiatric/Behavioral: Negative.  Negative for suicidal ideas.   ? ?Past Medical History:  ?Diagnosis Date  ? Complication of anesthesia   ? syncope after bgetting home from surgery.   ? Hyperlipidemia   ? Pre-diabetes   ? Unspecified essential hypertension 05/17/2013  ? ? ?Past Surgical History:  ?Procedure Laterality Date  ? HERNIA REPAIR    ? MELANOMA EXCISION Left 11/24/2021  ? Procedure: WIDE EXCISION OF LEFT CHEST SPINDLE CELL NEOPLASM;  Surgeon: Donnie Mesa, MD;  Location: [facility];  Service: General;  Laterality: Left;  LMA  ? XI ROBOTIC ASSISTED VENTRAL HERNIA N/A 03/06/2020  ? Procedure: XI ROBOTIC ASSISTED VENTRAL HERNIA;  Surgeon: Jules Husbands, MD;  Location: [facility];  Service: General;  Laterality: N/A;  ? ? ?Family History  ?Problem Relation Age of Onset  ? Diabetes Mother   ? Heart disease Father   ? ? ?Social History Reviewed with no changes to be made today.  ? ?Outpatient Medications Prior to Visit  ?Medication Sig Dispense Refill  ? amLODipine (NORVASC) 10 MG tablet TAKE 1 TABLET (10 MG TOTAL) BY MOUTH DAILY. 90 tablet 1  ? atorvastatin (LIPITOR) 10 MG tablet TAKE 1  TABLET (10 MG TOTAL) BY MOUTH DAILY. 90 tablet 1  ? losartan (COZAAR) 50 MG tablet Take 1 tablet (50 mg total) by mouth daily. 90 tablet 1  ? metFORMIN (GLUCOPHAGE) 500 MG tablet Take 2 tablets (1,000 mg total) by mouth daily with breakfast. 180 tablet 1  ? oxyCODONE (OXY IR/ROXICODONE) 5 MG immediate release tablet Take 1 tablet (5 mg total) by mouth every 6 (six) hours as needed for severe pain. (Patient not taking: Reported on 02/04/2022) 15 tablet 0  ? ?No facility-administered medications prior to visit.  ? ? ?No Known Allergies ? ?   ?Objective:  ?  ?BP 134/79 (BP Location: Left Arm, Patient Position: Sitting, Cuff Size: Large)   Pulse 72   Resp 16   Ht 5' 6"  (1.676 m)   Wt 206 lb (93.4 kg)   SpO2 98%   BMI 33.25 kg/m?  ?Wt Readings from Last 3 Encounters:  ?03/01/22 206 lb (93.4 kg)  ?02/04/22 207 lb 11.2 oz (94.2 kg)  ?01/05/22 204 lb (92.5 kg)  ? ? ?Physical Exam ?Vitals and nursing note reviewed.  ?Constitutional:   ?   Appearance: He is well-developed.  ?HENT:  ?   Head: Normocephalic and atraumatic.  ?Eyes:  ?   Conjunctiva/sclera:  ?   Right eye: Hemorrhage present.  ? ?Cardiovascular:  ?   Rate and Rhythm: Normal rate and regular rhythm.  ?   Heart sounds: Normal heart sounds. No murmur heard. ?  No friction rub. No gallop.  ?Pulmonary:  ?   Effort: Pulmonary effort is normal. No tachypnea or respiratory distress.  ?   Breath sounds: Normal breath sounds. No decreased breath sounds, wheezing, rhonchi or rales.  ?Chest:  ?   Chest wall: No tenderness.  ?Abdominal:  ?   General: Bowel sounds are normal.  ?   Palpations: Abdomen is soft.  ?Musculoskeletal:     ?   General: Normal range of motion.  ?   Cervical back: Normal range of motion.  ?Skin: ?   General: Skin is warm and dry.  ?Neurological:  ?   Mental Status: He is alert and oriented to person, place, and time.  ?   Coordination: Coordination normal.  ?Psychiatric:     ?   Behavior: Behavior normal. Behavior is cooperative.     ?   Thought  Content: Thought content normal.     ?   Judgment: Judgment normal.  ? ? ? ? ?   ?Patient has been counseled extensively about nutrition and exercise as well as the importance of adherence with medications and regular follow-up. The patient was given clear instructions to go to ER or return to medical center if symptoms don't improve, worsen or new problems develop. The patient verbalized understanding.  ? ?Follow-up: Return in about 3 months (around 06/01/2022) for HTN/HPL.  ? ?Gildardo Pounds, FNP-BC ?[place] ?[place], [place] ?[phone]   ?03/01/2022, 9:24 PM ?

## 2022-03-01 NOTE — Progress Notes (Signed)
Would like refill Naproxen ?Woke up and right eye was red ?

## 2022-03-02 ENCOUNTER — Other Ambulatory Visit: Payer: Self-pay

## 2022-03-02 ENCOUNTER — Ambulatory Visit
Admission: RE | Admit: 2022-03-02 | Discharge: 2022-03-02 | Disposition: A | Discharge status: Home / Self Care | Payer: Self-pay | Source: Ambulatory Visit | Attending: Radiation Oncology | Admitting: Radiation Oncology

## 2022-03-02 LAB — CMP14+EGFR
ALT: 27 IU/L (ref 0–44)
AST: 19 IU/L (ref 0–40)
Albumin/Globulin Ratio: 1.8 (ref 1.2–2.2)
Albumin: 4.7 g/dL (ref 4.0–5.0)
Alkaline Phosphatase: 98 IU/L (ref 44–121)
BUN/Creatinine Ratio: 19 (ref 9–20)
BUN: 14 mg/dL (ref 6–24)
Bilirubin Total: 0.4 mg/dL (ref 0.0–1.2)
CO2: 26 mmol/L (ref 20–29)
Calcium: 9.8 mg/dL (ref 8.7–10.2)
Chloride: 104 mmol/L (ref 96–106)
Creatinine, Ser: 0.75 mg/dL — ABNORMAL LOW (ref 0.76–1.27)
Globulin, Total: 2.6 g/dL (ref 1.5–4.5)
Glucose: 122 mg/dL — ABNORMAL HIGH (ref 70–99)
Potassium: 5.1 mmol/L (ref 3.5–5.2)
Sodium: 142 mmol/L (ref 134–144)
Total Protein: 7.3 g/dL (ref 6.0–8.5)
eGFR: 112 mL/min/{1.73_m2} (ref >59)

## 2022-03-02 LAB — RAD ONC ARIA SESSION SUMMARY
Course Elapsed Days: 21
Plan Fractions Treated to Date: 16
Plan Prescribed Dose Per Fraction: 2 Gy
Plan Total Fractions Prescribed: 25
Plan Total Prescribed Dose: 50 Gy
Reference Point Dosage Given to Date: 32 Gy
Reference Point Session Dosage Given: 2 Gy
Session Number: 16

## 2022-03-03 ENCOUNTER — Other Ambulatory Visit: Payer: Self-pay

## 2022-03-03 ENCOUNTER — Ambulatory Visit
Admission: RE | Admit: 2022-03-03 | Discharge: 2022-03-03 | Disposition: A | Discharge status: Home / Self Care | Payer: Self-pay | Source: Ambulatory Visit | Attending: Radiation Oncology | Admitting: Radiation Oncology

## 2022-03-03 LAB — RAD ONC ARIA SESSION SUMMARY
Course Elapsed Days: 22
Plan Fractions Treated to Date: 17
Plan Prescribed Dose Per Fraction: 2 Gy
Plan Total Fractions Prescribed: 25
Plan Total Prescribed Dose: 50 Gy
Reference Point Dosage Given to Date: 34 Gy
Reference Point Session Dosage Given: 2 Gy
Session Number: 17

## 2022-03-04 ENCOUNTER — Ambulatory Visit
Admission: RE | Admit: 2022-03-04 | Discharge: 2022-03-04 | Disposition: A | Discharge status: Home / Self Care | Payer: Self-pay | Source: Ambulatory Visit | Attending: Radiation Oncology | Admitting: Radiation Oncology

## 2022-03-04 ENCOUNTER — Other Ambulatory Visit: Payer: Self-pay

## 2022-03-04 LAB — RAD ONC ARIA SESSION SUMMARY
Course Elapsed Days: 23
Plan Fractions Treated to Date: 18
Plan Prescribed Dose Per Fraction: 2 Gy
Plan Total Fractions Prescribed: 25
Plan Total Prescribed Dose: 50 Gy
Reference Point Dosage Given to Date: 36 Gy
Reference Point Session Dosage Given: 2 Gy
Session Number: 18

## 2022-03-05 ENCOUNTER — Ambulatory Visit
Admission: RE | Admit: 2022-03-05 | Discharge: 2022-03-05 | Disposition: A | Discharge status: Home / Self Care | Payer: Self-pay | Source: Ambulatory Visit | Attending: Radiation Oncology | Admitting: Radiation Oncology

## 2022-03-05 ENCOUNTER — Other Ambulatory Visit: Payer: Self-pay

## 2022-03-05 LAB — RAD ONC ARIA SESSION SUMMARY
Course Elapsed Days: 24
Plan Fractions Treated to Date: 19
Plan Prescribed Dose Per Fraction: 2 Gy
Plan Total Fractions Prescribed: 25
Plan Total Prescribed Dose: 50 Gy
Reference Point Dosage Given to Date: 38 Gy
Reference Point Session Dosage Given: 2 Gy
Session Number: 19

## 2022-03-08 ENCOUNTER — Other Ambulatory Visit: Payer: Self-pay

## 2022-03-08 ENCOUNTER — Ambulatory Visit
Admission: RE | Admit: 2022-03-08 | Discharge: 2022-03-08 | Disposition: A | Discharge status: Home / Self Care | Payer: Self-pay | Source: Ambulatory Visit | Attending: Radiation Oncology | Admitting: Radiation Oncology

## 2022-03-08 LAB — RAD ONC ARIA SESSION SUMMARY
Course Elapsed Days: 27
Plan Fractions Treated to Date: 20
Plan Prescribed Dose Per Fraction: 2 Gy
Plan Total Fractions Prescribed: 25
Plan Total Prescribed Dose: 50 Gy
Reference Point Dosage Given to Date: 40 Gy
Reference Point Session Dosage Given: 2 Gy
Session Number: 20

## 2022-03-09 ENCOUNTER — Other Ambulatory Visit: Payer: Self-pay

## 2022-03-09 ENCOUNTER — Ambulatory Visit
Admission: RE | Admit: 2022-03-09 | Discharge: 2022-03-09 | Disposition: A | Discharge status: Home / Self Care | Payer: Self-pay | Source: Ambulatory Visit | Attending: Radiation Oncology | Admitting: Radiation Oncology

## 2022-03-09 LAB — RAD ONC ARIA SESSION SUMMARY
Course Elapsed Days: 28
Plan Fractions Treated to Date: 21
Plan Prescribed Dose Per Fraction: 2 Gy
Plan Total Fractions Prescribed: 25
Plan Total Prescribed Dose: 50 Gy
Reference Point Dosage Given to Date: 42 Gy
Reference Point Session Dosage Given: 2 Gy
Session Number: 21

## 2022-03-10 ENCOUNTER — Other Ambulatory Visit: Payer: Self-pay

## 2022-03-10 ENCOUNTER — Ambulatory Visit
Admission: RE | Admit: 2022-03-10 | Discharge: 2022-03-10 | Disposition: A | Discharge status: Home / Self Care | Payer: Self-pay | Source: Ambulatory Visit | Attending: Radiation Oncology | Admitting: Radiation Oncology

## 2022-03-10 LAB — RAD ONC ARIA SESSION SUMMARY
Course Elapsed Days: 29
Plan Fractions Treated to Date: 22
Plan Prescribed Dose Per Fraction: 2 Gy
Plan Total Fractions Prescribed: 25
Plan Total Prescribed Dose: 50 Gy
Reference Point Dosage Given to Date: 44 Gy
Reference Point Session Dosage Given: 2 Gy
Session Number: 22

## 2022-03-11 ENCOUNTER — Ambulatory Visit
Admission: RE | Admit: 2022-03-11 | Discharge: 2022-03-11 | Disposition: A | Discharge status: Home / Self Care | Payer: Self-pay | Source: Ambulatory Visit | Attending: Radiation Oncology | Admitting: Radiation Oncology

## 2022-03-11 ENCOUNTER — Other Ambulatory Visit: Payer: Self-pay

## 2022-03-11 LAB — RAD ONC ARIA SESSION SUMMARY
Course Elapsed Days: 30
Plan Fractions Treated to Date: 23
Plan Prescribed Dose Per Fraction: 2 Gy
Plan Total Fractions Prescribed: 25
Plan Total Prescribed Dose: 50 Gy
Reference Point Dosage Given to Date: 46 Gy
Reference Point Session Dosage Given: 2 Gy
Session Number: 23

## 2022-03-12 ENCOUNTER — Ambulatory Visit
Admission: RE | Admit: 2022-03-12 | Discharge: 2022-03-12 | Disposition: A | Discharge status: Home / Self Care | Payer: Self-pay | Source: Ambulatory Visit | Attending: Radiation Oncology | Admitting: Radiation Oncology

## 2022-03-12 ENCOUNTER — Other Ambulatory Visit: Payer: Self-pay

## 2022-03-12 LAB — RAD ONC ARIA SESSION SUMMARY
Course Elapsed Days: 31
Plan Fractions Treated to Date: 24
Plan Prescribed Dose Per Fraction: 2 Gy
Plan Total Fractions Prescribed: 25
Plan Total Prescribed Dose: 50 Gy
Reference Point Dosage Given to Date: 48 Gy
Reference Point Session Dosage Given: 2 Gy
Session Number: 24

## 2022-03-15 ENCOUNTER — Encounter: Payer: Self-pay | Admitting: Radiation Oncology

## 2022-03-15 ENCOUNTER — Ambulatory Visit
Admission: RE | Admit: 2022-03-15 | Discharge: 2022-03-15 | Disposition: A | Discharge status: Home / Self Care | Payer: Self-pay | Source: Ambulatory Visit | Attending: Radiation Oncology | Admitting: Radiation Oncology

## 2022-03-15 ENCOUNTER — Other Ambulatory Visit: Payer: Self-pay

## 2022-03-15 LAB — RAD ONC ARIA SESSION SUMMARY
Course Elapsed Days: 34
Plan Fractions Treated to Date: 25
Plan Prescribed Dose Per Fraction: 2 Gy
Plan Total Fractions Prescribed: 25
Plan Total Prescribed Dose: 50 Gy
Reference Point Dosage Given to Date: 50 Gy
Reference Point Session Dosage Given: 2 Gy
Session Number: 25

## 2022-03-16 ENCOUNTER — Other Ambulatory Visit: Payer: Self-pay

## 2022-03-16 ENCOUNTER — Ambulatory Visit
Admission: RE | Admit: 2022-03-16 | Discharge: 2022-03-16 | Disposition: A | Discharge status: Home / Self Care | Payer: Self-pay | Source: Ambulatory Visit | Attending: Radiation Oncology | Admitting: Radiation Oncology

## 2022-03-16 LAB — RAD ONC ARIA SESSION SUMMARY
Course Elapsed Days: 35
Plan Fractions Treated to Date: 1
Plan Prescribed Dose Per Fraction: 2 Gy
Plan Total Fractions Prescribed: 8
Plan Total Prescribed Dose: 16 Gy
Reference Point Dosage Given to Date: 52 Gy
Reference Point Session Dosage Given: 2 Gy
Session Number: 26

## 2022-03-17 ENCOUNTER — Other Ambulatory Visit: Payer: Self-pay

## 2022-03-17 ENCOUNTER — Ambulatory Visit
Admission: RE | Admit: 2022-03-17 | Discharge: 2022-03-17 | Disposition: A | Discharge status: Home / Self Care | Payer: Self-pay | Source: Ambulatory Visit | Attending: Radiation Oncology | Admitting: Radiation Oncology

## 2022-03-17 LAB — RAD ONC ARIA SESSION SUMMARY
Course Elapsed Days: 36
Plan Fractions Treated to Date: 2
Plan Prescribed Dose Per Fraction: 2 Gy
Plan Total Fractions Prescribed: 8
Plan Total Prescribed Dose: 16 Gy
Reference Point Dosage Given to Date: 54 Gy
Reference Point Session Dosage Given: 2 Gy
Session Number: 27

## 2022-03-18 ENCOUNTER — Other Ambulatory Visit: Payer: Self-pay

## 2022-03-18 ENCOUNTER — Ambulatory Visit
Admission: RE | Admit: 2022-03-18 | Discharge: 2022-03-18 | Disposition: A | Discharge status: Home / Self Care | Payer: Self-pay | Source: Ambulatory Visit | Attending: Radiation Oncology | Admitting: Radiation Oncology

## 2022-03-18 LAB — RAD ONC ARIA SESSION SUMMARY
Course Elapsed Days: 37
Plan Fractions Treated to Date: 3
Plan Prescribed Dose Per Fraction: 2 Gy
Plan Total Fractions Prescribed: 8
Plan Total Prescribed Dose: 16 Gy
Reference Point Dosage Given to Date: 56 Gy
Reference Point Session Dosage Given: 2 Gy
Session Number: 28

## 2022-03-19 ENCOUNTER — Ambulatory Visit
Admission: RE | Admit: 2022-03-19 | Discharge: 2022-03-19 | Disposition: A | Discharge status: Home / Self Care | Payer: Self-pay | Source: Ambulatory Visit | Attending: Radiation Oncology | Admitting: Radiation Oncology

## 2022-03-19 ENCOUNTER — Ambulatory Visit: Payer: Self-pay | Admitting: Radiation Oncology

## 2022-03-19 ENCOUNTER — Other Ambulatory Visit: Payer: Self-pay

## 2022-03-19 DIAGNOSIS — C496 Malignant neoplasm of connective and soft tissue of trunk, unspecified: Secondary | ICD-10-CM

## 2022-03-19 LAB — RAD ONC ARIA SESSION SUMMARY
Course Elapsed Days: 38
Plan Fractions Treated to Date: 4
Plan Prescribed Dose Per Fraction: 2 Gy
Plan Total Fractions Prescribed: 8
Plan Total Prescribed Dose: 16 Gy
Reference Point Dosage Given to Date: 58 Gy
Reference Point Session Dosage Given: 2 Gy
Session Number: 29

## 2022-03-19 MED ORDER — SONAFINE EX EMUL
1.0000 "application " | Freq: Once | CUTANEOUS | Status: AC
  Administered 2022-03-19: 1 via TOPICAL

## 2022-03-22 ENCOUNTER — Other Ambulatory Visit: Payer: Self-pay

## 2022-03-22 ENCOUNTER — Ambulatory Visit
Admission: RE | Admit: 2022-03-22 | Discharge: 2022-03-22 | Disposition: A | Discharge status: Home / Self Care | Payer: Self-pay | Source: Ambulatory Visit | Attending: Radiation Oncology | Admitting: Radiation Oncology

## 2022-03-22 LAB — RAD ONC ARIA SESSION SUMMARY
Course Elapsed Days: 41
Plan Fractions Treated to Date: 5
Plan Prescribed Dose Per Fraction: 2 Gy
Plan Total Fractions Prescribed: 8
Plan Total Prescribed Dose: 16 Gy
Reference Point Dosage Given to Date: 60 Gy
Reference Point Session Dosage Given: 2 Gy
Session Number: 30

## 2022-03-23 ENCOUNTER — Ambulatory Visit
Admission: RE | Admit: 2022-03-23 | Discharge: 2022-03-23 | Disposition: A | Discharge status: Home / Self Care | Payer: Self-pay | Source: Ambulatory Visit | Attending: Radiation Oncology | Admitting: Radiation Oncology

## 2022-03-23 ENCOUNTER — Other Ambulatory Visit: Payer: Self-pay

## 2022-03-23 LAB — RAD ONC ARIA SESSION SUMMARY
Course Elapsed Days: 42
Plan Fractions Treated to Date: 6
Plan Prescribed Dose Per Fraction: 2 Gy
Plan Total Fractions Prescribed: 8
Plan Total Prescribed Dose: 16 Gy
Reference Point Dosage Given to Date: 62 Gy
Reference Point Session Dosage Given: 2 Gy
Session Number: 31

## 2022-03-24 ENCOUNTER — Ambulatory Visit
Admission: RE | Admit: 2022-03-24 | Discharge: 2022-03-24 | Disposition: A | Discharge status: Home / Self Care | Payer: Self-pay | Source: Ambulatory Visit | Attending: Radiation Oncology | Admitting: Radiation Oncology

## 2022-03-24 ENCOUNTER — Other Ambulatory Visit: Payer: Self-pay

## 2022-03-24 LAB — RAD ONC ARIA SESSION SUMMARY
Course Elapsed Days: 43
Plan Fractions Treated to Date: 7
Plan Prescribed Dose Per Fraction: 2 Gy
Plan Total Fractions Prescribed: 8
Plan Total Prescribed Dose: 16 Gy
Reference Point Dosage Given to Date: 64 Gy
Reference Point Session Dosage Given: 2 Gy
Session Number: 32

## 2022-03-25 ENCOUNTER — Encounter: Payer: Self-pay | Admitting: Radiation Oncology

## 2022-03-25 ENCOUNTER — Ambulatory Visit: Payer: Self-pay

## 2022-03-25 ENCOUNTER — Other Ambulatory Visit: Payer: Self-pay

## 2022-03-25 ENCOUNTER — Ambulatory Visit
Admission: RE | Admit: 2022-03-25 | Discharge: 2022-03-25 | Disposition: A | Discharge status: Home / Self Care | Payer: Self-pay | Source: Ambulatory Visit | Attending: Radiation Oncology | Admitting: Radiation Oncology

## 2022-03-25 LAB — RAD ONC ARIA SESSION SUMMARY
Course Elapsed Days: 44
Plan Fractions Treated to Date: 8
Plan Prescribed Dose Per Fraction: 2 Gy
Plan Total Fractions Prescribed: 8
Plan Total Prescribed Dose: 16 Gy
Reference Point Dosage Given to Date: 66 Gy
Reference Point Session Dosage Given: 2 Gy
Session Number: 33

## 2022-03-26 ENCOUNTER — Ambulatory Visit: Payer: Self-pay

## 2022-03-30 ENCOUNTER — Ambulatory Visit: Payer: Self-pay

## 2022-03-30 NOTE — Progress Notes (Signed)
                                                                                                                                                             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 He developed anticipated skin changes in the treatment field.   Plan: The patient will receive a call in about one month from the radiation oncology department. He will continue follow up with Dr. Alen Blew as well.   ________________________________________________    Carola Rhine, [facility]

## 2022-04-01 ENCOUNTER — Other Ambulatory Visit: Payer: Self-pay

## 2022-05-10 ENCOUNTER — Ambulatory Visit
Admission: RE | Admit: 2022-05-10 | Discharge: 2022-05-10 | Disposition: A | Discharge status: Home / Self Care | Payer: Self-pay | Source: Ambulatory Visit | Attending: Oncology | Admitting: Oncology

## 2022-05-10 DIAGNOSIS — C496 Malignant neoplasm of connective and soft tissue of trunk, unspecified: Secondary | ICD-10-CM

## 2022-05-10 NOTE — Progress Notes (Signed)
  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 He developed anticipated skin changes in the treatment field.    Impression/Plan: 1. 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  I was unable to reach the patient but left a voicemail and on the message, but with the help of a spanish speaking interpretor discussed that we would be happy to continue to follow him as needed, but he will also continue to follow up with Dr. Alen Blew in medical oncology.      Carola Rhine, PAC

## 2022-05-11 ENCOUNTER — Other Ambulatory Visit: Payer: Self-pay

## 2022-06-04 ENCOUNTER — Encounter: Payer: Self-pay | Admitting: Nurse Practitioner

## 2022-06-04 ENCOUNTER — Other Ambulatory Visit: Payer: Self-pay

## 2022-06-04 ENCOUNTER — Ambulatory Visit: Payer: Self-pay | Attending: Nurse Practitioner | Admitting: Nurse Practitioner

## 2022-06-04 VITALS — BP 132/75 | HR 73 | Temp 98.0°F | Wt 208.4 lb

## 2022-06-04 DIAGNOSIS — R21 Rash and other nonspecific skin eruption: Secondary | ICD-10-CM

## 2022-06-04 DIAGNOSIS — I10 Essential (primary) hypertension: Secondary | ICD-10-CM

## 2022-06-04 DIAGNOSIS — R7303 Prediabetes: Secondary | ICD-10-CM

## 2022-06-04 MED ORDER — METFORMIN HCL 500 MG PO TABS
1000.0000 mg | ORAL_TABLET | Freq: Every day | ORAL | 1 refills | Status: DC
  Filled 2022-06-04: qty 180, 90d supply, fill #0
  Filled 2022-11-09: qty 60, 30d supply, fill #1

## 2022-06-04 MED ORDER — TRIAMCINOLONE ACETONIDE 0.025 % EX OINT
1.0000 | TOPICAL_OINTMENT | Freq: Two times a day (BID) | CUTANEOUS | 0 refills | Status: DC
  Filled 2022-06-04: qty 30, 15d supply, fill #0

## 2022-06-04 NOTE — Progress Notes (Signed)
Assessment & Plan:  Austin Rubio was seen today for hypertension.  Diagnoses and all orders for this visit:  Essential hypertension -     CMP14+EGFR  Skin rash -     triamcinolone (KENALOG) 0.025 % ointment; Apply to skin rash 2 (two) times daily As needed.  Prediabetes -     metFORMIN (GLUCOPHAGE) 500 MG tablet; Take 2 tablets (1,000 mg total) by mouth daily with breakfast. -     CMP14+EGFR    Patient has been counseled on age-appropriate routine health concerns for screening and prevention. These are reviewed and up-to-date. Referrals have been placed accordingly. Immunizations are up-to-date or declined.    Subjective:   Chief Complaint  Patient presents with   Hypertension   Hypertension Pertinent negatives include no blurred vision, chest pain, headaches, malaise/fatigue, palpitations or shortness of breath.   Austin Rubio 46 y.o. 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    SKIN Today he does endorse pruritic rash around surgical site. Likely radiation related changes however will treat with low dose steroid ointment as needed.    HTN Blood pressure is well controlled. He endorses adherence taking losartan 50 mg and amlodipine 10 mg daily. BP Readings from Last 3 Encounters:  06/04/22 132/75  03/01/22 134/79  02/04/22 135/73     Review of Systems  Constitutional:  Negative for fever, malaise/fatigue and weight loss.  HENT: Negative.  Negative for nosebleeds.   Eyes: Negative.  Negative for blurred vision, double vision and photophobia.  Respiratory: Negative.  Negative for cough and shortness of breath.   Cardiovascular: Negative.  Negative for chest pain, palpitations and leg swelling.   Gastrointestinal: Negative.  Negative for heartburn, nausea and vomiting.  Musculoskeletal: Negative.  Negative for myalgias.  Skin:  Positive for itching and rash.  Neurological: Negative.  Negative for dizziness, focal weakness, seizures and headaches.  Psychiatric/Behavioral: Negative.  Negative for suicidal ideas.     Past Medical History:  Diagnosis Date   Complication of anesthesia    syncope after bgetting home from surgery.    Hyperlipidemia    Pre-diabetes    Unspecified essential hypertension 05/17/2013    Past Surgical History:  Procedure Laterality Date   HERNIA REPAIR     MELANOMA EXCISION Left 11/24/2021   Procedure: WIDE EXCISION OF LEFT CHEST SPINDLE CELL NEOPLASM;  Surgeon: Donnie Mesa, MD;  Location: [facility];  Service: General;  Laterality: Left;  LMA   XI ROBOTIC ASSISTED VENTRAL HERNIA N/A 03/06/2020   Procedure: XI ROBOTIC ASSISTED VENTRAL HERNIA;  Surgeon: Jules Husbands, MD;  Location: [facility];  Service: General;  Laterality: N/A;    Family History  Problem Relation Age of Onset   Diabetes Mother    Heart disease Father     Social History Reviewed with no changes to be made today.   Outpatient Medications Prior to Visit  Medication Sig Dispense Refill   amLODipine (NORVASC) 10 MG tablet TAKE 1 TABLET (10 MG TOTAL) BY MOUTH DAILY. 90 tablet 1   atorvastatin (LIPITOR) 10 MG tablet TAKE 1 TABLET (10 MG TOTAL) BY MOUTH DAILY. 90 tablet 1   losartan (COZAAR) 50 MG tablet Take 1 tablet (50 mg total) by mouth daily. 90 tablet 1   naproxen (NAPROSYN) 500  MG tablet Take 1 tablet (500 mg total) by mouth 2 (two) times daily with a meal. 60 tablet 1   metFORMIN (GLUCOPHAGE) 500 MG tablet Take 2 tablets (1,000 mg total) by mouth daily with breakfast. 180 tablet 1   No facility-administered medications prior to visit.    No Known Allergies     Objective:    BP 132/75   Pulse 73   Temp 98 F (36.7 C) (Oral)   Wt 208 lb 6.4 oz (94.5 kg)    SpO2 97%   BMI 33.64 kg/m  Wt Readings from Last 3 Encounters:  06/04/22 208 lb 6.4 oz (94.5 kg)  03/01/22 206 lb (93.4 kg)  02/04/22 207 lb 11.2 oz (94.2 kg)    Physical Exam Vitals and nursing note reviewed.  Constitutional:      Appearance: He is well-developed.  HENT:     Head: Normocephalic and atraumatic.  Cardiovascular:     Rate and Rhythm: Normal rate and regular rhythm.     Heart sounds: Normal heart sounds. No murmur heard.    No friction rub. No gallop.  Pulmonary:     Effort: Pulmonary effort is normal. No tachypnea or respiratory distress.     Breath sounds: Normal breath sounds. No decreased breath sounds, wheezing, rhonchi or rales.  Chest:     Chest wall: No tenderness.  Abdominal:     General: Bowel sounds are normal.     Palpations: Abdomen is soft.  Musculoskeletal:        General: Normal range of motion.     Cervical back: Normal range of motion.  Skin:    General: Skin is warm and dry.     Findings: Rash present.     Comments: SEE PHOTO  Neurological:     Mental Status: He is alert and oriented to person, place, and time.     Coordination: Coordination normal.  Psychiatric:        Behavior: Behavior normal. Behavior is cooperative.        Thought Content: Thought content normal.        Judgment: Judgment normal.          Patient has been counseled extensively about nutrition and exercise as well as the importance of adherence with medications and regular follow-up. The patient was given clear instructions to go to ER or return to medical center if symptoms don't improve, worsen or new problems develop. The patient verbalized understanding.   Follow-up: Return in about 3 months (around 09/04/2022) for HTN.   Gildardo Pounds, FNP-BC [facility] and [place], [place]   06/04/2022, 4:38 PM

## 2022-06-04 NOTE — Progress Notes (Signed)
Pt has a rash at the surgery site on his right upper chest.

## 2022-06-05 LAB — CMP14+EGFR
ALT: 27 IU/L (ref 0–44)
AST: 23 IU/L (ref 0–40)
Albumin/Globulin Ratio: 2 (ref 1.2–2.2)
Albumin: 4.5 g/dL (ref 4.1–5.1)
Alkaline Phosphatase: 99 IU/L (ref 44–121)
BUN/Creatinine Ratio: 12 (ref 9–20)
BUN: 11 mg/dL (ref 6–24)
Bilirubin Total: 0.4 mg/dL (ref 0.0–1.2)
CO2: 24 mmol/L (ref 20–29)
Calcium: 9 mg/dL (ref 8.7–10.2)
Chloride: 104 mmol/L (ref 96–106)
Creatinine, Ser: 0.89 mg/dL (ref 0.76–1.27)
Globulin, Total: 2.3 g/dL (ref 1.5–4.5)
Glucose: 87 mg/dL (ref 70–99)
Potassium: 4.3 mmol/L (ref 3.5–5.2)
Sodium: 141 mmol/L (ref 134–144)
Total Protein: 6.8 g/dL (ref 6.0–8.5)
eGFR: 106 mL/min/{1.73_m2} (ref >59)

## 2022-08-27 ENCOUNTER — Other Ambulatory Visit: Payer: Self-pay

## 2022-09-06 ENCOUNTER — Other Ambulatory Visit: Payer: Self-pay

## 2022-09-06 ENCOUNTER — Encounter: Payer: Self-pay | Admitting: Nurse Practitioner

## 2022-09-06 ENCOUNTER — Ambulatory Visit: Payer: Self-pay | Attending: Nurse Practitioner | Admitting: Nurse Practitioner

## 2022-09-06 VITALS — BP 131/71 | HR 70 | Temp 98.0°F | Ht 66.0 in | Wt 212.6 lb

## 2022-09-06 DIAGNOSIS — I10 Essential (primary) hypertension: Secondary | ICD-10-CM

## 2022-09-06 DIAGNOSIS — R7303 Prediabetes: Secondary | ICD-10-CM

## 2022-09-06 DIAGNOSIS — Z23 Encounter for immunization: Secondary | ICD-10-CM

## 2022-09-06 DIAGNOSIS — E785 Hyperlipidemia, unspecified: Secondary | ICD-10-CM

## 2022-09-06 LAB — POCT GLYCOSYLATED HEMOGLOBIN (HGB A1C): HbA1c, POC (controlled diabetic range): 6.2 % (ref 0.0–7.0)

## 2022-09-06 MED ORDER — LOSARTAN POTASSIUM 50 MG PO TABS
50.0000 mg | ORAL_TABLET | Freq: Every day | ORAL | 1 refills | Status: DC
  Filled 2022-09-06: qty 90, 90d supply, fill #0
  Filled 2022-10-08: qty 30, 30d supply, fill #0
  Filled 2022-11-09: qty 30, 30d supply, fill #1
  Filled 2022-12-16: qty 30, 30d supply, fill #2
  Filled 2023-01-21: qty 30, 30d supply, fill #3
  Filled 2023-03-01: qty 30, 30d supply, fill #4

## 2022-09-06 MED ORDER — ATORVASTATIN CALCIUM 10 MG PO TABS
10.0000 mg | ORAL_TABLET | Freq: Every day | ORAL | 1 refills | Status: DC
  Filled 2022-09-06: qty 30, 30d supply, fill #0
  Filled 2022-11-09: qty 30, 30d supply, fill #1
  Filled 2022-12-16: qty 30, 30d supply, fill #2
  Filled 2023-01-21: qty 30, 30d supply, fill #3
  Filled 2023-03-01: qty 30, 30d supply, fill #4

## 2022-09-06 MED ORDER — AMLODIPINE BESYLATE 10 MG PO TABS
10.0000 mg | ORAL_TABLET | Freq: Every day | ORAL | 1 refills | Status: DC
  Filled 2022-09-06: qty 30, 30d supply, fill #0
  Filled 2022-10-08: qty 30, 30d supply, fill #1
  Filled 2022-11-09: qty 30, 30d supply, fill #2
  Filled 2022-12-16: qty 30, 30d supply, fill #3
  Filled 2023-01-21: qty 30, 30d supply, fill #4
  Filled 2023-03-01: qty 30, 30d supply, fill #5

## 2022-09-06 NOTE — Progress Notes (Signed)
Assessment & Plan:  Austin Rubio was seen today for hypertension and prediabetes.  Diagnoses and all orders for this visit:  Prediabetes -     POCT glycosylated hemoglobin (Hb A1C) -     Basic metabolic panel  Essential hypertension -     amLODipine (NORVASC) 10 MG tablet; Take 1 tablet (10 mg total) by mouth daily. For blood pressure -     losartan (COZAAR) 50 MG tablet; Take 1 tablet (50 mg total) by mouth daily. For blood pressure. Please fill as a 90 day supply Continue all antihypertensives as prescribed.  Reminded to bring in blood pressure log for follow  up appointment.  RECOMMENDATIONS: DASH/Mediterranean Diets are healthier choices for HTN.    Dyslipidemia -     atorvastatin (LIPITOR) 10 MG tablet; Take 1 tablet (10 mg total) by mouth daily. FOR CHOLESTEROL. Please fill as a 90 day supply INSTRUCTIONS: Work on a low fat, heart healthy diet and participate in regular aerobic exercise program by working out at least 150 minutes per week; 5 days a week-30 minutes per day. Avoid red meat/beef/steak,  fried foods. junk foods, sodas, sugary drinks, unhealthy snacking, alcohol and smoking.  Drink at least 80 oz of water per day and monitor your carbohydrate intake daily.    Need for immunization against influenza -     Flu Vaccine QUAD 74moIM (Fluarix, Fluzone & Alfiuria Quad PF)    Patient has been counseled on age-appropriate routine health concerns for screening and prevention. These are reviewed and up-to-date. Referrals have been placed accordingly. Immunizations are up-to-date or declined.    Subjective:   Chief Complaint  Patient presents with   Hypertension   Prediabetes    Heel pain   HPI Austin Fangman89y.o. male presents to office today for follow up to HTN and prediabetes.  He has a past medical history of Complication of anesthesia, Hyperlipidemia, Pre-diabetes, and Unspecified essential hypertension (05/17/2013).    HTN Blood pressure is well controlled. He  is taking amlodipine 10 mg daily, losartan '50mg'$  daily.  BP Readings from Last 3 Encounters:  09/06/22 131/71  06/04/22 132/75  03/01/22 134/79    Prediabetes Well controlled with metformin 1000 mg BID. LDL not at goal with atorvastatin 10 mg daily.  Lab Results  Component Value Date   HGBA1C 6.2 09/06/2022    Lab Results  Component Value Date   LDLCALC 112 (H) 02/27/2021    Review of Systems  Constitutional:  Negative for fever, malaise/fatigue and weight loss.  HENT: Negative.  Negative for nosebleeds.   Eyes: Negative.  Negative for blurred vision, double vision and photophobia.  Respiratory: Negative.  Negative for cough and shortness of breath.   Cardiovascular: Negative.  Negative for chest pain, palpitations and leg swelling.  Gastrointestinal: Negative.  Negative for heartburn, nausea and vomiting.  Musculoskeletal: Negative.  Negative for myalgias.  Neurological: Negative.  Negative for dizziness, focal weakness, seizures and headaches.  Psychiatric/Behavioral: Negative.  Negative for suicidal ideas.     Past Medical History:  Diagnosis Date   Complication of anesthesia    syncope after bgetting home from surgery.    Hyperlipidemia    Pre-diabetes    Unspecified essential hypertension 05/17/2013    Past Surgical History:  Procedure Laterality Date   HERNIA REPAIR     MELANOMA EXCISION Left 11/24/2021   Procedure: WIDE EXCISION OF LEFT CHEST SPINDLE CELL NEOPLASM;  Surgeon: TDonnie Mesa MD;  [place]  Service: General;  Laterality: Left;  LMA   XI ROBOTIC ASSISTED VENTRAL HERNIA N/A 03/06/2020   Procedure: XI ROBOTIC ASSISTED VENTRAL HERNIA;  Surgeon: Jules Husbands, MD;  Location: [facility];  Service: General;  Laterality: N/A;    Family History  Problem Relation Age of Onset   Diabetes Mother    Heart disease Father     Social History Reviewed with no changes to be made today.   Outpatient Medications Prior to Visit   Medication Sig Dispense Refill   metFORMIN (GLUCOPHAGE) 500 MG tablet Take 2 tablets (1,000 mg total) by mouth daily with breakfast. 180 tablet 1   naproxen (NAPROSYN) 500 MG tablet Take 1 tablet (500 mg total) by mouth 2 (two) times daily with a meal. 60 tablet 1   triamcinolone (KENALOG) 0.025 % ointment Apply to skin rash 2 (two) times daily As needed. 30 g 0   amLODipine (NORVASC) 10 MG tablet TAKE 1 TABLET (10 MG TOTAL) BY MOUTH DAILY. 90 tablet 1   atorvastatin (LIPITOR) 10 MG tablet TAKE 1 TABLET (10 MG TOTAL) BY MOUTH DAILY. 90 tablet 1   losartan (COZAAR) 50 MG tablet Take 1 tablet (50 mg total) by mouth daily. 90 tablet 1   No facility-administered medications prior to visit.    No Known Allergies     Objective:    BP 131/71   Pulse 70   Temp 98 F (36.7 C) (Temporal)   Ht '5\' 6"'$  (1.676 m)   Wt 212 lb 9.6 oz (96.4 kg)   SpO2 98%   BMI 34.31 kg/m  Wt Readings from Last 3 Encounters:  09/06/22 212 lb 9.6 oz (96.4 kg)  06/04/22 208 lb 6.4 oz (94.5 kg)  03/01/22 206 lb (93.4 kg)    Physical Exam Vitals and nursing note reviewed.  Constitutional:      Appearance: He is well-developed.  HENT:     Head: Normocephalic and atraumatic.  Cardiovascular:     Rate and Rhythm: Normal rate and regular rhythm.     Heart sounds: Normal heart sounds. No murmur heard.    No friction rub. No gallop.  Pulmonary:     Effort: Pulmonary effort is normal. No tachypnea or respiratory distress.     Breath sounds: Normal breath sounds. No decreased breath sounds, wheezing, rhonchi or rales.  Chest:     Chest wall: No tenderness.  Abdominal:     General: Bowel sounds are normal.     Palpations: Abdomen is soft.  Musculoskeletal:        General: Normal range of motion.     Cervical back: Normal range of motion.  Skin:    General: Skin is warm and dry.  Neurological:     Mental Status: He is alert and oriented to person, place, and time.     Coordination: Coordination normal.   Psychiatric:        Behavior: Behavior normal. Behavior is cooperative.        Thought Content: Thought content normal.        Judgment: Judgment normal.          Patient has been counseled extensively about nutrition and exercise as well as the importance of adherence with medications and regular follow-up. The patient was given clear instructions to go to ER or return to medical center if symptoms don't improve, worsen or new problems develop. The patient verbalized understanding.   Follow-up: Return for physical.   Gildardo Pounds, FNP-BC [facility]  [place], [place]   09/13/2022, 7:51 PM

## 2022-09-07 LAB — BASIC METABOLIC PANEL
BUN/Creatinine Ratio: 14 (ref 9–20)
BUN: 15 mg/dL (ref 6–24)
CO2: 26 mmol/L (ref 20–29)
Calcium: 9.3 mg/dL (ref 8.7–10.2)
Chloride: 101 mmol/L (ref 96–106)
Creatinine, Ser: 1.07 mg/dL (ref 0.76–1.27)
Glucose: 90 mg/dL (ref 70–99)
Potassium: 4.3 mmol/L (ref 3.5–5.2)
Sodium: 139 mmol/L (ref 134–144)
eGFR: 86 mL/min/{1.73_m2} (ref >59)

## 2022-10-08 ENCOUNTER — Other Ambulatory Visit: Payer: Self-pay

## 2022-11-09 ENCOUNTER — Other Ambulatory Visit: Payer: Self-pay

## 2022-11-10 ENCOUNTER — Other Ambulatory Visit: Payer: Self-pay

## 2022-12-16 ENCOUNTER — Other Ambulatory Visit: Payer: Self-pay

## 2023-01-13 ENCOUNTER — Telehealth: Payer: Self-pay | Admitting: Emergency Medicine

## 2023-01-13 NOTE — Telephone Encounter (Signed)
Copied from [place] [id number]. Topic: General - Other >> Jan 13, 2023  4:06 PM Leone Payor F wrote: Reason for CRM: Pt's wife is calling in because pt tried to renew their [employer], and the paperwork was sent back. Pt was instructed to contact [facility] [place]. Please advise.

## 2023-01-21 ENCOUNTER — Other Ambulatory Visit: Payer: Self-pay

## 2023-01-24 ENCOUNTER — Other Ambulatory Visit: Payer: Self-pay

## 2023-03-01 ENCOUNTER — Other Ambulatory Visit: Payer: Self-pay

## 2023-03-02 ENCOUNTER — Other Ambulatory Visit: Payer: Self-pay

## 2023-03-07 ENCOUNTER — Other Ambulatory Visit: Payer: Self-pay

## 2023-03-07 ENCOUNTER — Encounter: Payer: Self-pay | Admitting: Nurse Practitioner

## 2023-03-07 ENCOUNTER — Ambulatory Visit: Payer: Self-pay | Attending: Nurse Practitioner | Admitting: Nurse Practitioner

## 2023-03-07 VITALS — BP 130/79 | HR 75 | Resp 16 | Ht 65.0 in | Wt 209.4 lb

## 2023-03-07 DIAGNOSIS — R7303 Prediabetes: Secondary | ICD-10-CM

## 2023-03-07 DIAGNOSIS — Z0000 Encounter for general adult medical examination without abnormal findings: Secondary | ICD-10-CM

## 2023-03-07 DIAGNOSIS — G44209 Tension-type headache, unspecified, not intractable: Secondary | ICD-10-CM

## 2023-03-07 DIAGNOSIS — Z1211 Encounter for screening for malignant neoplasm of colon: Secondary | ICD-10-CM

## 2023-03-07 DIAGNOSIS — I10 Essential (primary) hypertension: Secondary | ICD-10-CM

## 2023-03-07 DIAGNOSIS — E785 Hyperlipidemia, unspecified: Secondary | ICD-10-CM

## 2023-03-07 LAB — POCT GLYCOSYLATED HEMOGLOBIN (HGB A1C): HbA1c, POC (prediabetic range): 5.8 % (ref 5.7–6.4)

## 2023-03-07 MED ORDER — NAPROXEN 500 MG PO TABS
500.0000 mg | ORAL_TABLET | Freq: Two times a day (BID) | ORAL | 1 refills | Status: DC
  Filled 2023-03-07: qty 60, 30d supply, fill #0

## 2023-03-07 MED ORDER — LOSARTAN POTASSIUM 50 MG PO TABS
50.0000 mg | ORAL_TABLET | Freq: Every day | ORAL | 1 refills | Status: DC
  Filled 2023-03-07: qty 90, 90d supply, fill #0
  Filled 2023-04-07: qty 30, 30d supply, fill #0
  Filled 2023-05-16: qty 30, 30d supply, fill #1
  Filled 2023-06-28: qty 90, 90d supply, fill #2

## 2023-03-07 MED ORDER — METFORMIN HCL 500 MG PO TABS
1000.0000 mg | ORAL_TABLET | Freq: Every day | ORAL | 1 refills | Status: DC
  Filled 2023-03-07: qty 180, 90d supply, fill #0

## 2023-03-07 MED ORDER — AMLODIPINE BESYLATE 10 MG PO TABS
10.0000 mg | ORAL_TABLET | Freq: Every day | ORAL | 1 refills | Status: DC
  Filled 2023-03-07: qty 90, 90d supply, fill #0
  Filled 2023-04-07: qty 30, 30d supply, fill #0
  Filled 2023-05-16: qty 30, 30d supply, fill #1
  Filled 2023-06-28 (×2): qty 90, 90d supply, fill #2
  Filled 2023-06-28: qty 30, 30d supply, fill #2
  Filled 2023-08-05: qty 90, 90d supply, fill #3
  Filled 2023-08-05: qty 30, 30d supply, fill #3

## 2023-03-07 MED ORDER — ATORVASTATIN CALCIUM 10 MG PO TABS
10.0000 mg | ORAL_TABLET | Freq: Every day | ORAL | 1 refills | Status: DC
  Filled 2023-03-07: qty 90, 90d supply, fill #0
  Filled 2023-04-07: qty 30, 30d supply, fill #0
  Filled 2023-06-28: qty 30, 30d supply, fill #1
  Filled 2023-06-28 (×2): qty 90, 90d supply, fill #1
  Filled 2023-09-08 (×2): qty 30, 30d supply, fill #2

## 2023-03-07 NOTE — Progress Notes (Signed)
Assessment & Plan:  Austin Rubio was seen today for annual exam.  Diagnoses and all orders for this visit:  Encounter for annual physical exam -     CMP14+EGFR -     CBC with Differential -     Lipid panel  Prediabetes -     POCT glycosylated hemoglobin (Hb A1C) -     metFORMIN (GLUCOPHAGE) 500 MG tablet; Take 2 tablets (1,000 mg total) by mouth daily with breakfast.  Essential hypertension Continue all antihypertensives as prescribed.  Reminded to bring in blood pressure log for follow  up appointment.  RECOMMENDATIONS: DASH/Mediterranean Diets are healthier choices for HTN.   -     amLODipine (NORVASC) 10 MG tablet; Take 1 tablet (10 mg total) by mouth daily. For blood pressure -     losartan (COZAAR) 50 MG tablet; Take 1 tablet (50 mg total) by mouth daily. For blood pressure.  Dyslipidemia INSTRUCTIONS: Work on a low fat, heart healthy diet and participate in regular aerobic exercise program by working out at least 150 minutes per week; 5 days a week-30 minutes per day. Avoid red meat/beef/steak,  fried foods. junk foods, sodas, sugary drinks, unhealthy snacking, alcohol and smoking.  Drink at least 80 oz of water per day and monitor your carbohydrate intake daily.   -     atorvastatin (LIPITOR) 10 MG tablet; Take 1 tablet (10 mg total) by mouth daily. FOR CHOLESTEROL.  Acute non intractable tension-type headache Headaches controlled -     naproxen (NAPROSYN) 500 MG tablet; Take 1 tablet (500 mg total) by mouth 2 (two) times daily with a meal. For back pain    Patient has been counseled on age-appropriate routine health concerns for screening and prevention. These are reviewed and up-to-date. Referrals have been placed accordingly. Immunizations are up-to-date or declined.    Subjective:   Chief Complaint  Patient presents with   Annual Exam   HPI Austin Rubio 47 y.o. male presents to office today annual physical exam  VRI was used to communicate directly with patient for  the entire encounter including providing detailed patient instructions.    Review of Systems  Constitutional:  Negative for fever, malaise/fatigue and weight loss.  HENT: Negative.  Negative for nosebleeds.   Eyes: Negative.  Negative for blurred vision, double vision and photophobia.  Respiratory: Negative.  Negative for cough and shortness of breath.   Cardiovascular: Negative.  Negative for chest pain, palpitations and leg swelling.  Gastrointestinal: Negative.  Negative for heartburn, nausea and vomiting.  Genitourinary: Negative.   Musculoskeletal: Negative.  Negative for myalgias.  Skin: Negative.   Neurological: Negative.  Negative for dizziness, focal weakness, seizures and headaches.  Endo/Heme/Allergies: Negative.   Psychiatric/Behavioral: Negative.  Negative for suicidal ideas.     Past Medical History:  Diagnosis Date   Complication of anesthesia    syncope after bgetting home from surgery.    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   Outpatient Medications Prior to Visit  Medication Sig Dispense Refill   triamcinolone (KENALOG) 0.025 % ointment Apply to skin rash 2 (two) times daily As needed. 30 g 0   amLODipine (NORVASC) 10 MG tablet Take 1 tablet (10 mg total) by mouth daily. For blood pressure 90 tablet 1    atorvastatin (LIPITOR) 10 MG tablet Take 1 tablet (10 mg total) by mouth daily. FOR CHOLESTEROL. 90 tablet 1   losartan (COZAAR) 50 MG tablet Take 1 tablet (50 mg total) by mouth daily. For blood pressure. 90 tablet 1   metFORMIN (GLUCOPHAGE) 500 MG tablet Take 2 tablets (1,000 mg total) by mouth daily with breakfast. 180 tablet 1   naproxen (NAPROSYN) 500 MG tablet Take 1 tablet (500 mg total) by mouth 2 (two) times daily with a meal. 60 tablet 1   No facility-administered medications prior to visit.    No Known Allergies     Objective:    BP 130/79   Pulse 75   Resp 16   Ht 5\' 5"  (1.651 m)   Wt 209 lb 6.4 oz (95 kg)   SpO2 95%   BMI 34.85 kg/m  Wt Readings from Last 3 Encounters:  03/07/23 209 lb 6.4 oz (95 kg)  09/06/22 212 lb 9.6 oz (96.4 kg)  06/04/22 208 lb 6.4 oz (94.5 kg)    Physical Exam Constitutional:      Appearance: He is well-developed.  HENT:     Head: Normocephalic and atraumatic.     Right Ear: Hearing, tympanic membrane, ear canal and external ear normal.     Left Ear: Hearing, tympanic membrane, ear canal and external ear normal.     Nose: Nose normal. No mucosal edema or rhinorrhea.     Right Turbinates: Not enlarged.     Left Turbinates: Not enlarged.     Mouth/Throat:     Lips: Pink.     Mouth: Mucous membranes are moist.     Dentition: No gingival swelling, dental abscesses or gum lesions.     Pharynx: Uvula midline.     Tonsils: No tonsillar exudate. 1+ on the right. 1+ on the left.  Eyes:     General: Lids are normal. No scleral icterus.    Extraocular Movements: Extraocular movements intact.     Conjunctiva/sclera: Conjunctivae normal.     Pupils: Pupils are equal, round, and reactive to light.  Neck:     Thyroid: No thyromegaly.     Trachea: No tracheal deviation.  Cardiovascular:     Rate and Rhythm: Normal rate and regular rhythm.     Heart sounds: Normal heart sounds. No murmur heard.    No friction rub. No gallop.  Pulmonary:      Effort: Pulmonary effort is normal. No respiratory distress.     Breath sounds: Normal breath sounds. No wheezing or rales.  Chest:     Chest wall: No mass or tenderness.  Breasts:    Right: No inverted nipple, mass, nipple discharge, skin change or tenderness.     Left: No inverted nipple, mass, nipple discharge, skin change or tenderness.  Abdominal:     General: Bowel sounds are normal. There is no distension.     Palpations: Abdomen is soft. There is no mass.     Tenderness: There is no  abdominal tenderness. There is no guarding or rebound.  Musculoskeletal:        General: No tenderness or deformity. Normal range of motion.     Cervical back: Normal range of motion and neck supple.  Lymphadenopathy:     Cervical: No cervical adenopathy.  Skin:    General: Skin is warm and dry.     Capillary Refill: Capillary refill takes less than 2 seconds.     Findings: No erythema.  Neurological:     Mental Status: He is alert and oriented to person, place, and time.     Cranial Nerves: No cranial nerve deficit.     Sensory: Sensation is intact.     Motor: No abnormal muscle tone.     Coordination: Coordination is intact. Coordination normal.     Gait: Gait is intact.     Deep Tendon Reflexes: Reflexes normal.     Reflex Scores:      Patellar reflexes are 1+ on the right side and 1+ on the left side. Psychiatric:        Attention and Perception: Attention normal.        Mood and Affect: Mood normal.        Speech: Speech normal.        Behavior: Behavior normal.        Thought Content: Thought content normal.        Judgment: Judgment normal.          Patient has been counseled extensively about nutrition and exercise as well as the importance of adherence with medications and regular follow-up. The patient was given clear instructions to go to ER or return to medical center if symptoms don't improve, worsen or new problems develop. The patient verbalized understanding.    Follow-up: Return in about 6 months (around 09/07/2023).   Claiborne Rigg, FNP-BC [facility] [place], [place] [phone]   03/07/2023, 3:18 PM

## 2023-03-08 LAB — LIPID PANEL
Chol/HDL Ratio: 4.7 ratio (ref 0.0–5.0)
Cholesterol, Total: 166 mg/dL (ref 100–199)
HDL: 35 mg/dL — ABNORMAL LOW (ref >39)
LDL Chol Calc (NIH): 74 mg/dL (ref 0–99)
Triglycerides: 359 mg/dL — ABNORMAL HIGH (ref 0–149)
VLDL Cholesterol Cal: 57 mg/dL — ABNORMAL HIGH (ref 5–40)

## 2023-03-08 LAB — CBC WITH DIFFERENTIAL/PLATELET
Basophils Absolute: 0 10*3/uL (ref 0.0–0.2)
Basos: 0 %
EOS (ABSOLUTE): 0.3 10*3/uL (ref 0.0–0.4)
Eos: 5 %
Hematocrit: 42 % (ref 37.5–51.0)
Hemoglobin: 14.7 g/dL (ref 13.0–17.7)
Immature Grans (Abs): 0 10*3/uL (ref 0.0–0.1)
Immature Granulocytes: 0 %
Lymphocytes Absolute: 1.6 10*3/uL (ref 0.7–3.1)
Lymphs: 27 %
MCH: 30.1 pg (ref 26.6–33.0)
MCHC: 35 g/dL (ref 31.5–35.7)
MCV: 86 fL (ref 79–97)
Monocytes Absolute: 0.4 10*3/uL (ref 0.1–0.9)
Monocytes: 7 %
Neutrophils Absolute: 3.5 10*3/uL (ref 1.4–7.0)
Neutrophils: 61 %
Platelets: 291 10*3/uL (ref 150–450)
RBC: 4.88 x10E6/uL (ref 4.14–5.80)
RDW: 12.4 % (ref 11.6–15.4)
WBC: 5.8 10*3/uL (ref 3.4–10.8)

## 2023-03-08 LAB — CMP14+EGFR
ALT: 37 IU/L (ref 0–44)
AST: 24 IU/L (ref 0–40)
Albumin/Globulin Ratio: 1.8 (ref 1.2–2.2)
Albumin: 4.5 g/dL (ref 4.1–5.1)
Alkaline Phosphatase: 93 IU/L (ref 44–121)
BUN/Creatinine Ratio: 16 (ref 9–20)
BUN: 15 mg/dL (ref 6–24)
Bilirubin Total: 0.3 mg/dL (ref 0.0–1.2)
CO2: 19 mmol/L — ABNORMAL LOW (ref 20–29)
Calcium: 8.8 mg/dL (ref 8.7–10.2)
Chloride: 103 mmol/L (ref 96–106)
Creatinine, Ser: 0.93 mg/dL (ref 0.76–1.27)
Globulin, Total: 2.5 g/dL (ref 1.5–4.5)
Glucose: 117 mg/dL — ABNORMAL HIGH (ref 70–99)
Potassium: 4.1 mmol/L (ref 3.5–5.2)
Sodium: 141 mmol/L (ref 134–144)
Total Protein: 7 g/dL (ref 6.0–8.5)
eGFR: 101 mL/min/{1.73_m2} (ref >59)

## 2023-03-14 ENCOUNTER — Other Ambulatory Visit: Payer: Self-pay

## 2023-04-07 ENCOUNTER — Other Ambulatory Visit: Payer: Self-pay

## 2023-05-18 ENCOUNTER — Other Ambulatory Visit: Payer: Self-pay

## 2023-06-28 ENCOUNTER — Other Ambulatory Visit: Payer: Self-pay

## 2023-06-29 ENCOUNTER — Other Ambulatory Visit: Payer: Self-pay

## 2023-07-13 IMAGING — US US BREAST BX W LOC DEV 1ST LESION IMG BX SPEC US GUIDE*L*
1 series · 12 of 12 positions shown · non-contrast
Comparison: Previous exam(s).
COMPARISON: Previous exam(s).

Addendum:
CLINICAL DATA: Ultrasound-guided biopsy of a left breast mass at 10
o'clock

EXAM:
ULTRASOUND GUIDED LEFT BREAST CORE NEEDLE BIOPSY

[Series 1: us breast bx w loc dev 1st lesion img bx spec us g · 0.07mm/px · 12 of 12 slices shown]
[im 1/12]
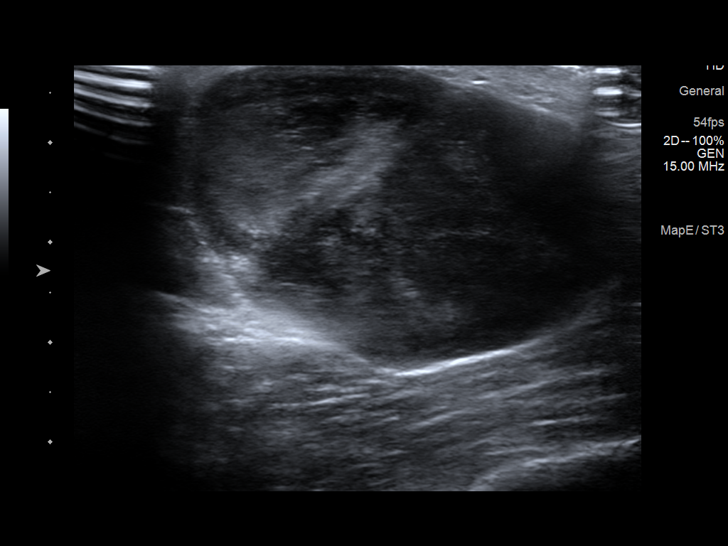
[im 2/12]
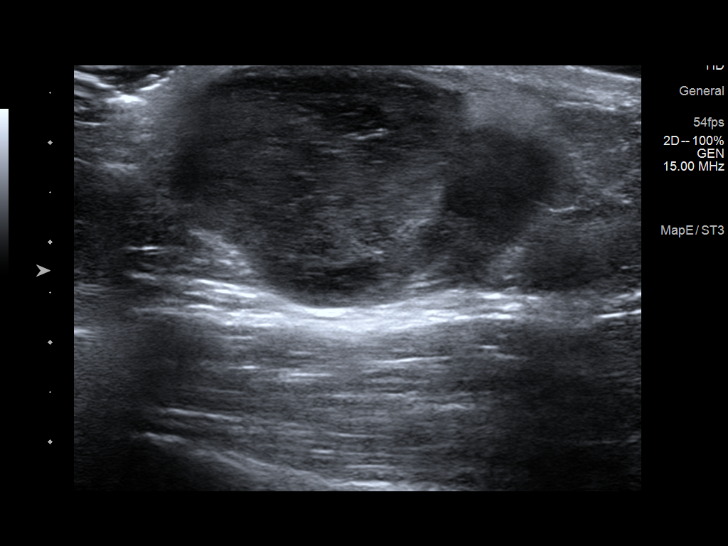
[im 3/12]
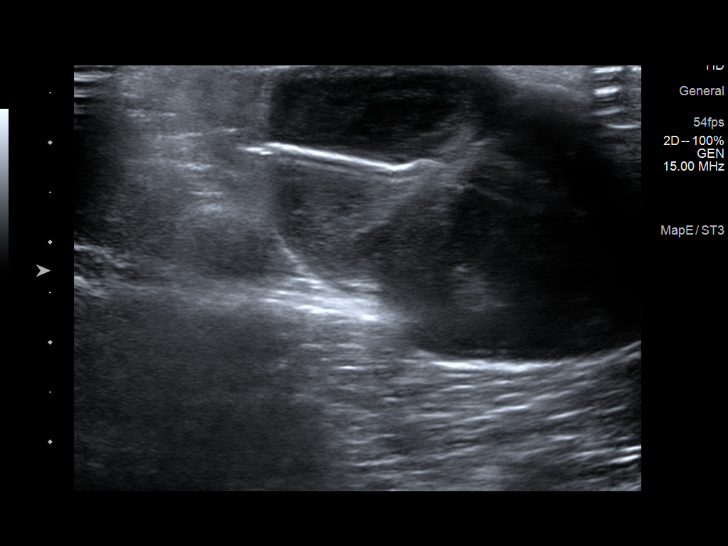
[im 4/12]
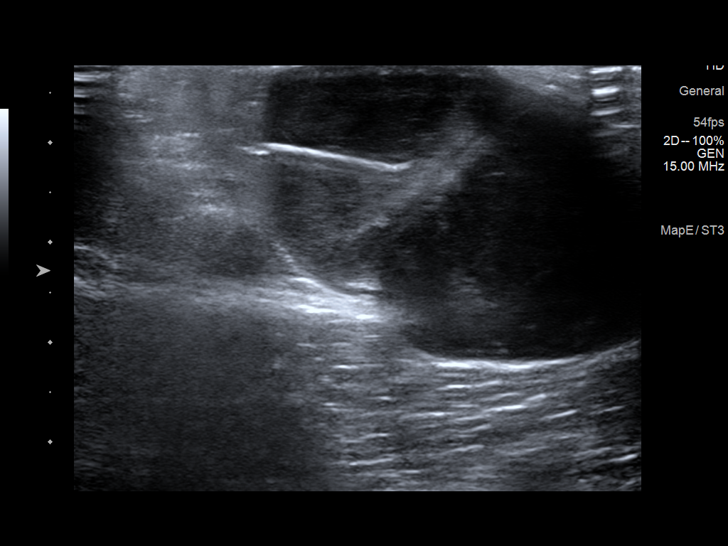
[im 5/12]
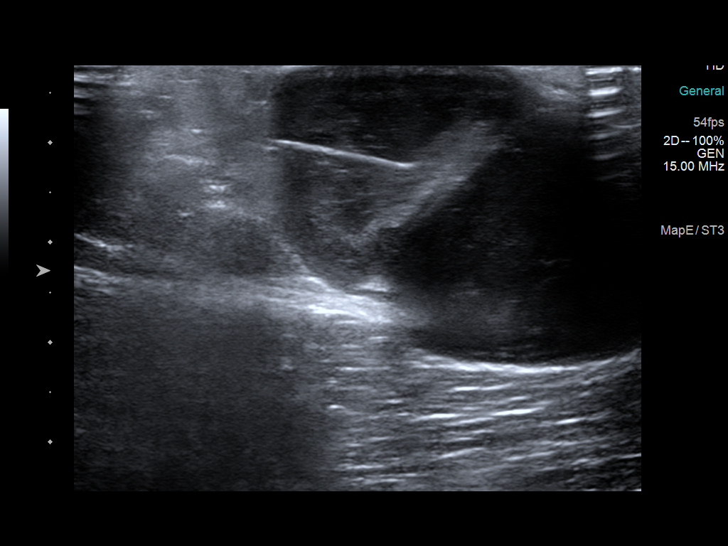
[im 6/12]
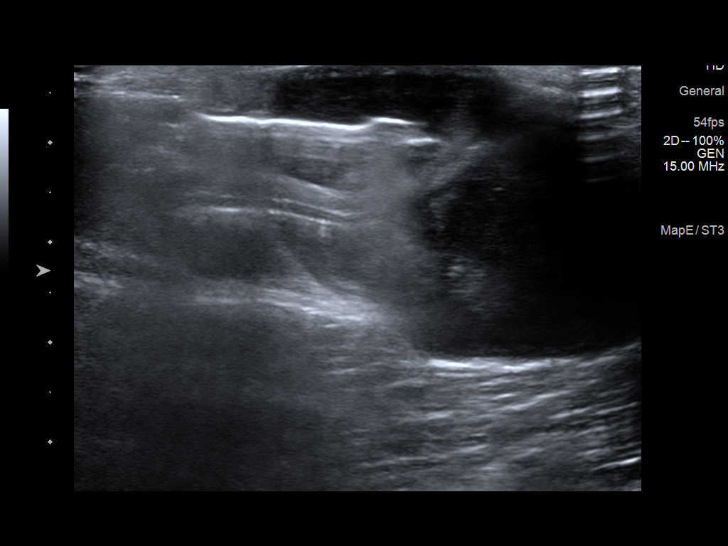
[im 7/12]
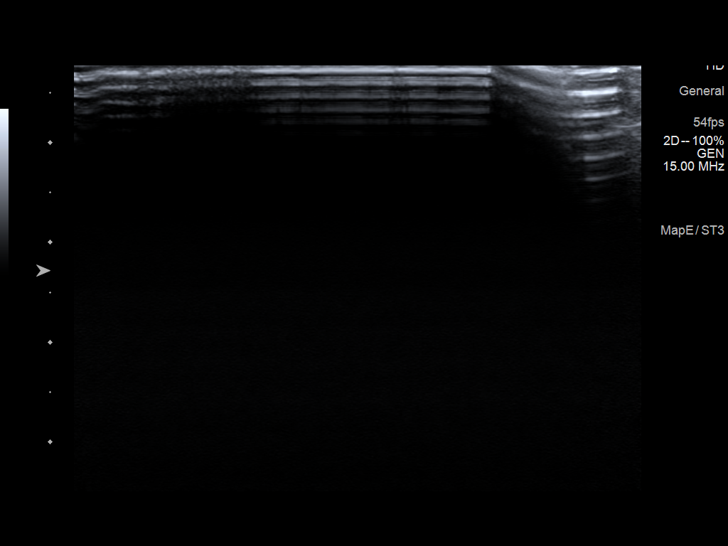
[im 8/12]
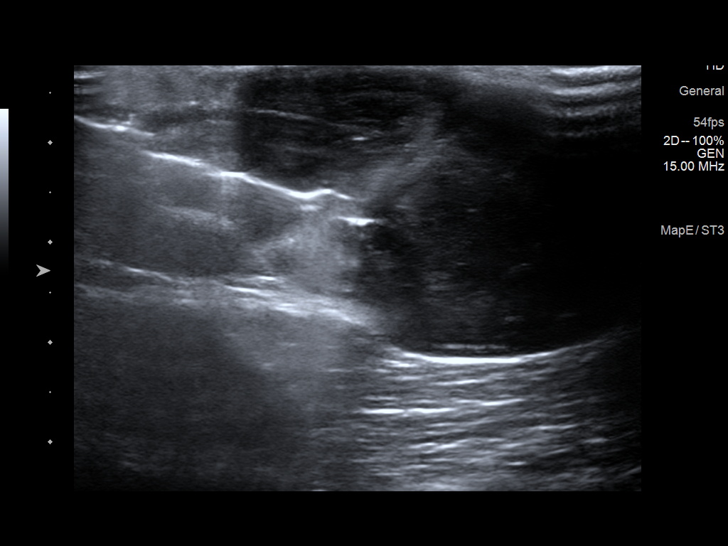
[im 9/12]
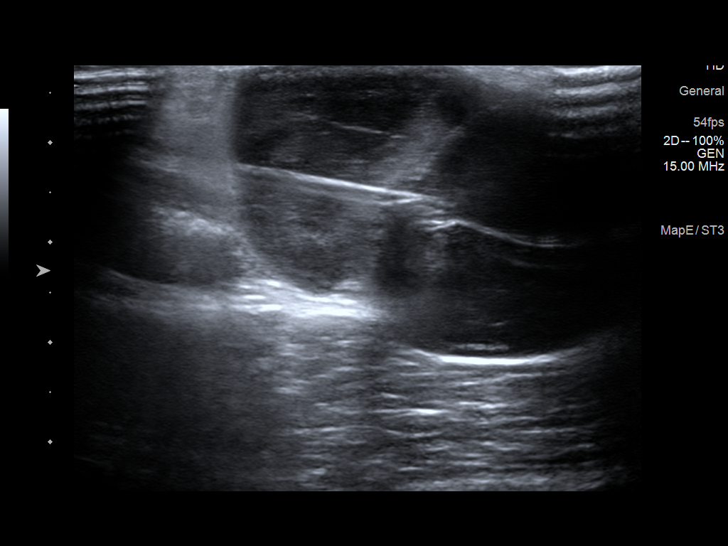
[im 10/12]
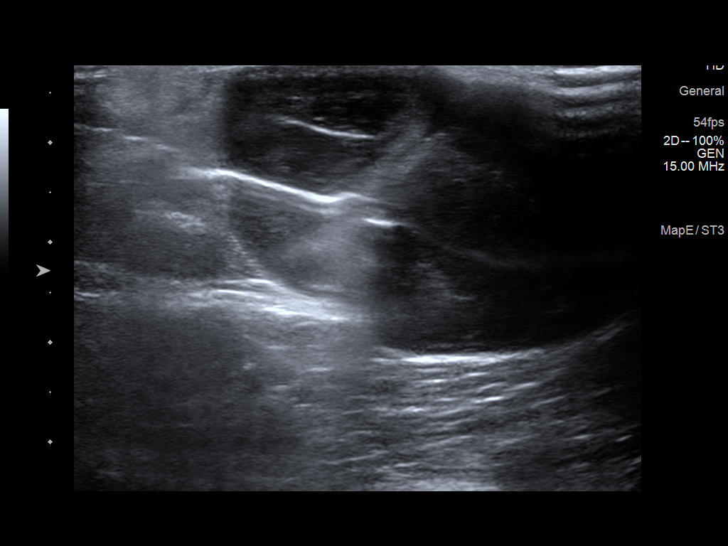
[im 11/12]
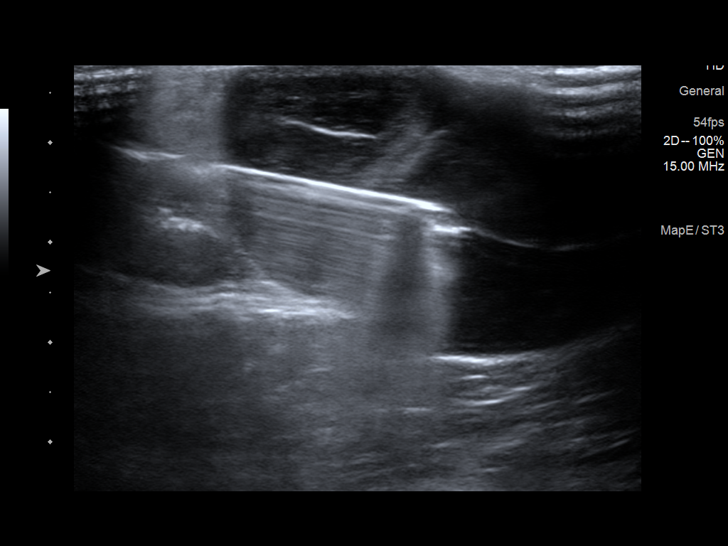
[im 12/12]
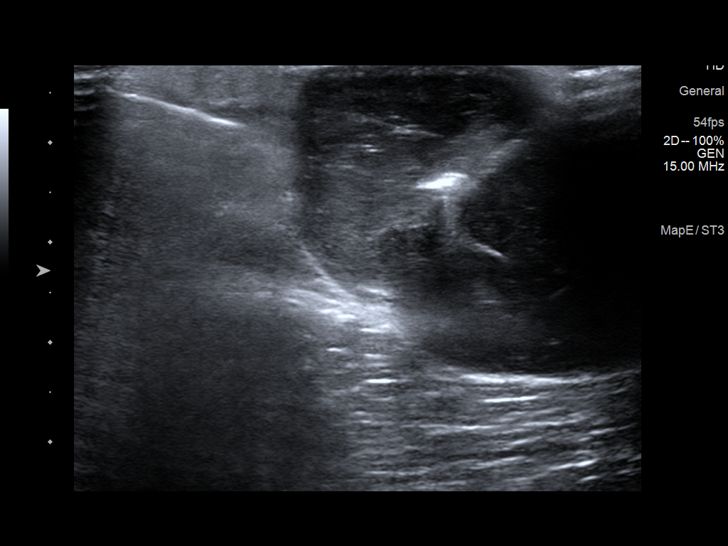

[12 of 12 positions shown; findings below may reference images not displayed]



Lesion quadrant: 10 o'clock left breast mass

Using sterile technique and 1% Lidocaine as local anesthetic, under
direct ultrasound visualization, a 14 gauge Daher device was
used to perform biopsy of a 10 o'clock left breast mass using a
lateral approach. At the conclusion of the procedure a coil shaped
tissue marker clip was deployed into the biopsy cavity. Follow up 2
view mammogram was performed and dictated separately.
IMPRESSION: Ultrasound guided biopsy of a 10 o'clock left breast mass. No
apparent complications.

ADDENDUM:
Pathology revealed SPINDLE CELL NEOPLASM of the LEFT breast, 10
o'clock, 8cmfn (coil clip). This was found to be concordant by Dr.
Asshed Varkalis.

Pathology results were discussed with the patient's spouse (Rajendra
Jeypa Stiale Mojarrango) by telephone with Don Lolito [employer] Bilingual Patient
Services Representative. The patient reported doing well after the
biopsy with tenderness at the site. Post biopsy instructions and
care were reviewed and questions were answered. The patient was
encouraged to call The [REDACTED] for any
additional concerns. The direct number was provided.

Surgical consultation has been arranged with Dr. Juxtcalme Tiger at
[REDACTED] on November 20, 2021.

Pathology results reported by Ananya Matsumoto RN on 11/18/2021.



Lesion quadrant: 10 o'clock left breast mass

Using sterile technique and 1% Lidocaine as local anesthetic, under
direct ultrasound visualization, a 14 gauge Daher device was
used to perform biopsy of a 10 o'clock left breast mass using a
lateral approach. At the conclusion of the procedure a coil shaped
tissue marker clip was deployed into the biopsy cavity. Follow up 2
view mammogram was performed and dictated separately.
IMPRESSION: Ultrasound guided biopsy of a 10 o'clock left breast mass. No
apparent complications.

## 2023-07-13 IMAGING — MG MM BREAST LOCALIZATION CLIP
4 series · 4 of 12 positions shown · non-contrast
Comparison: Previous exam(s).

CLINICAL DATA: Evaluate biopsy marker

EXAM:
3D DIAGNOSTIC LEFT MAMMOGRAM POST ULTRASOUND BIOPSY

[L ML synth-2D]
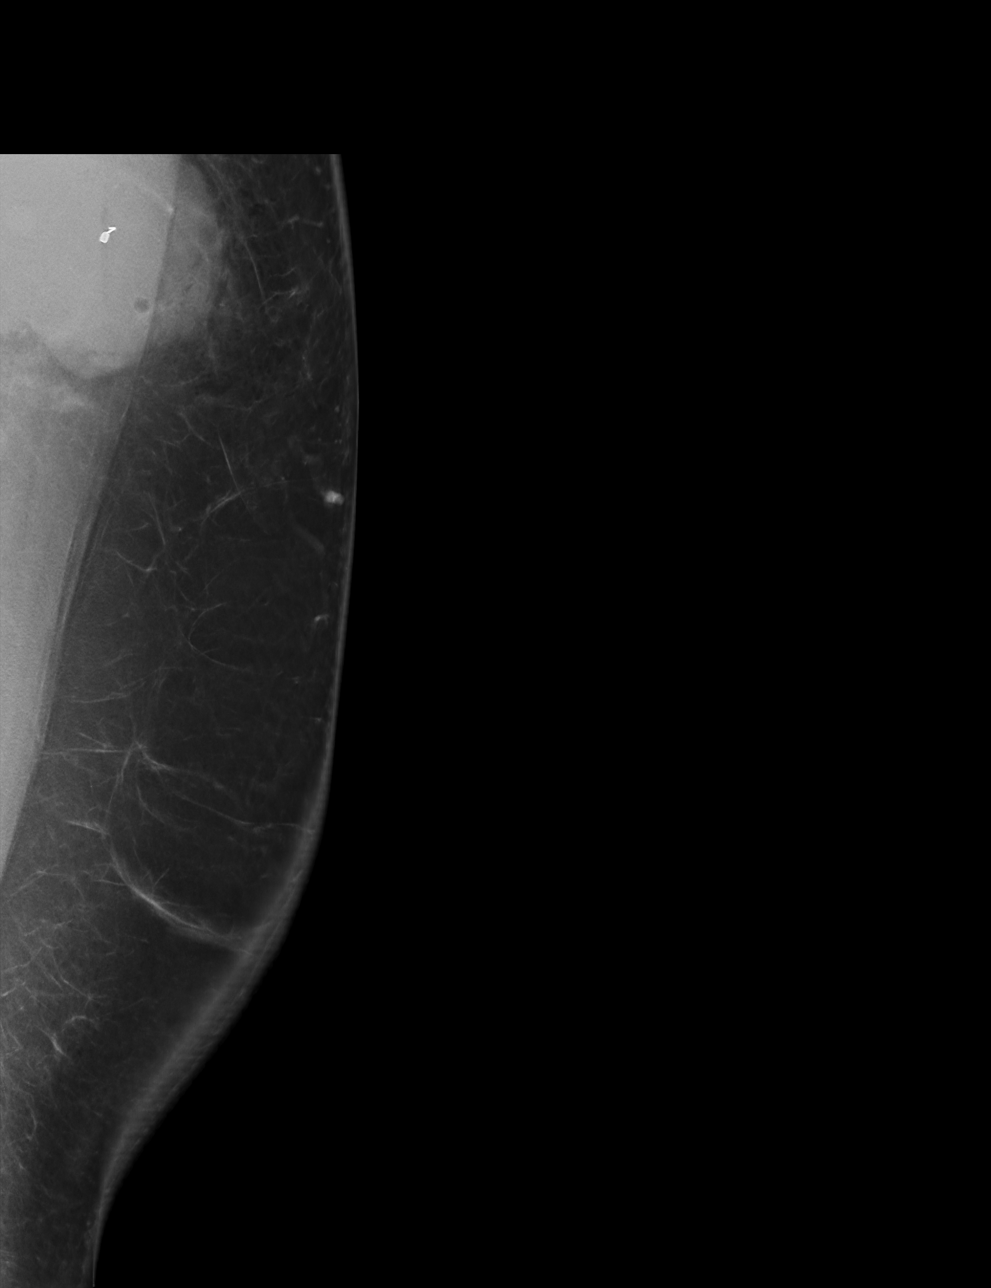

[L CC synth-2D]
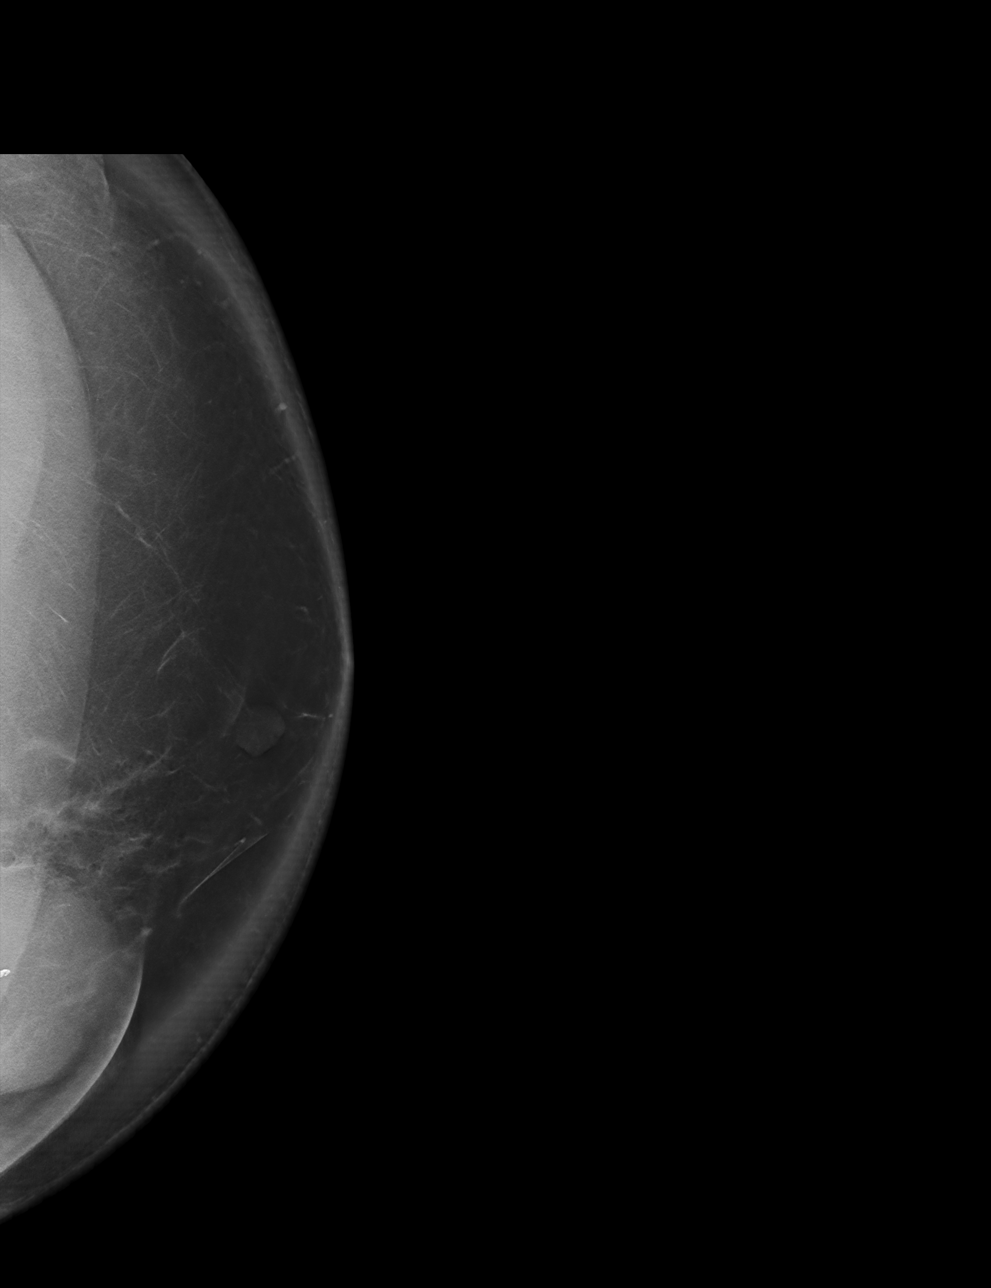

[L ML tomo · tomo slice 57/113.0]
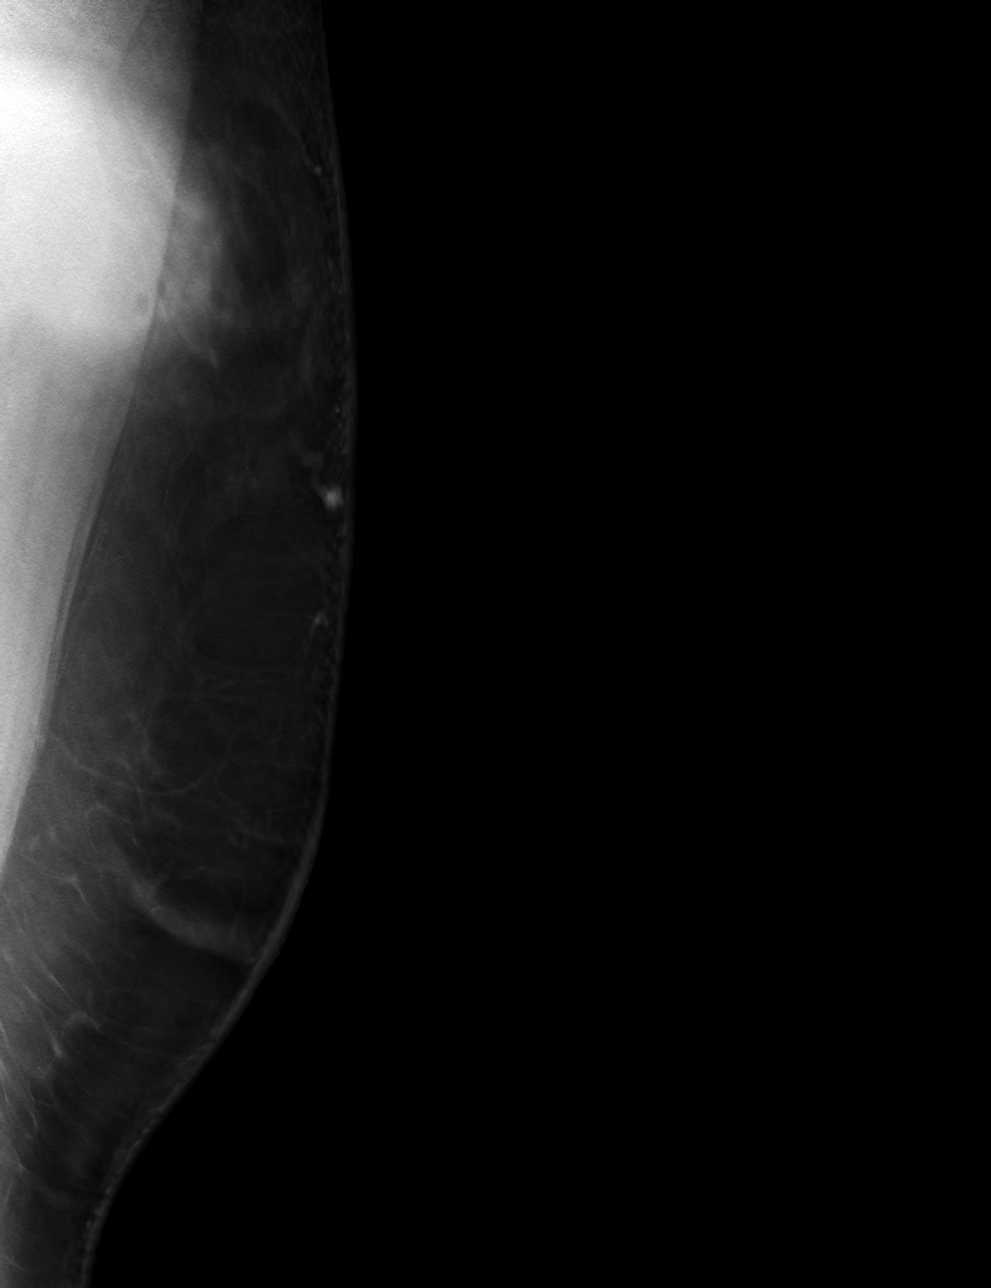

[L CC tomo · tomo slice 60/119.0]
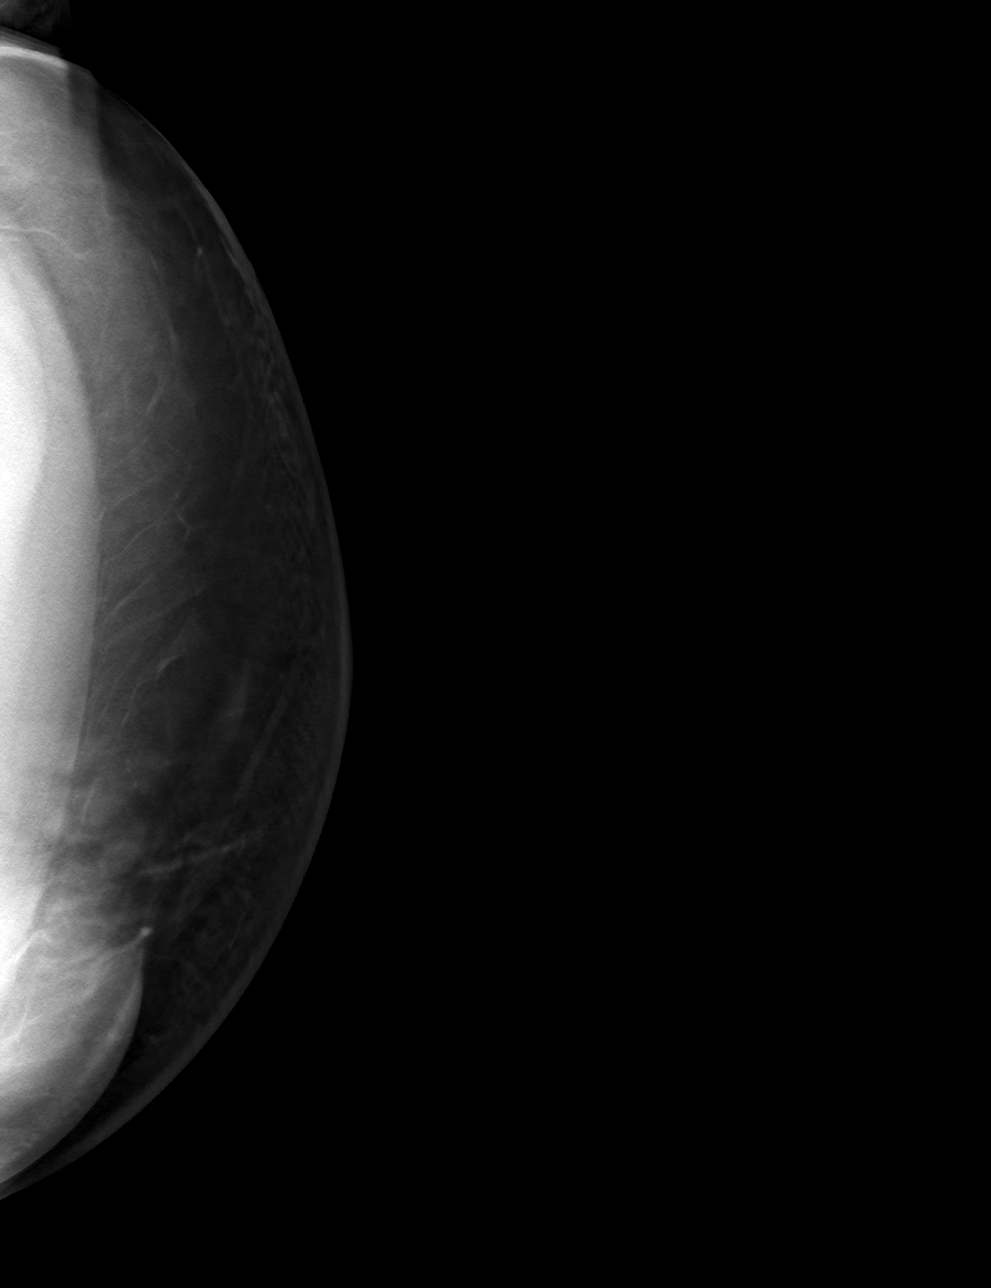

[4 of 12 positions shown; findings below may reference images not displayed]

FINDINGS: 3D Mammographic images were obtained following ultrasound guided
biopsy of a left breast mass. The biopsy marking clip is in expected
position at the site of biopsy.
IMPRESSION: Appropriate positioning of the coil shaped biopsy marking clip at
the site of biopsy in the biopsied left breast mass.

Final Assessment: Post Procedure Mammograms for Marker Placement

## 2023-08-05 ENCOUNTER — Other Ambulatory Visit: Payer: Self-pay

## 2023-08-08 ENCOUNTER — Other Ambulatory Visit: Payer: Self-pay

## 2023-08-12 ENCOUNTER — Other Ambulatory Visit: Payer: Self-pay

## 2023-09-07 ENCOUNTER — Ambulatory Visit: Payer: Self-pay | Admitting: Nurse Practitioner

## 2023-09-08 ENCOUNTER — Other Ambulatory Visit: Payer: Self-pay

## 2023-09-19 ENCOUNTER — Other Ambulatory Visit: Payer: Self-pay

## 2023-10-03 ENCOUNTER — Encounter: Payer: Self-pay | Admitting: Nurse Practitioner

## 2023-10-03 ENCOUNTER — Ambulatory Visit: Payer: Self-pay | Attending: Nurse Practitioner | Admitting: Nurse Practitioner

## 2023-10-03 ENCOUNTER — Other Ambulatory Visit: Payer: Self-pay

## 2023-10-03 VITALS — BP 130/73 | HR 71 | Ht 65.0 in | Wt 213.0 lb

## 2023-10-03 DIAGNOSIS — K4090 Unilateral inguinal hernia, without obstruction or gangrene, not specified as recurrent: Secondary | ICD-10-CM

## 2023-10-03 DIAGNOSIS — R7303 Prediabetes: Secondary | ICD-10-CM

## 2023-10-03 DIAGNOSIS — I10 Essential (primary) hypertension: Secondary | ICD-10-CM

## 2023-10-03 DIAGNOSIS — E781 Pure hyperglyceridemia: Secondary | ICD-10-CM

## 2023-10-03 LAB — POCT GLYCOSYLATED HEMOGLOBIN (HGB A1C): HbA1c, POC (prediabetic range): 6 % (ref 5.7–6.4)

## 2023-10-03 MED ORDER — LOSARTAN POTASSIUM 50 MG PO TABS
50.0000 mg | ORAL_TABLET | Freq: Every day | ORAL | 1 refills | Status: DC
  Filled 2023-10-03: qty 90, 90d supply, fill #0

## 2023-10-03 MED ORDER — ATORVASTATIN CALCIUM 10 MG PO TABS
10.0000 mg | ORAL_TABLET | Freq: Every day | ORAL | 1 refills | Status: DC
  Filled 2023-10-03: qty 90, 90d supply, fill #0
  Filled 2024-02-13 (×2): qty 90, 90d supply, fill #1

## 2023-10-03 MED ORDER — METFORMIN HCL 500 MG PO TABS
1000.0000 mg | ORAL_TABLET | Freq: Every day | ORAL | 1 refills | Status: DC
  Filled 2023-10-03: qty 180, 90d supply, fill #0

## 2023-10-03 NOTE — Progress Notes (Signed)
Assessment & Plan:  Aristeo was seen today for medical management of chronic issues.  Diagnoses and all orders for this visit:  Essential hypertension -     losartan (COZAAR) 50 MG tablet; Take 1 tablet (50 mg total) by mouth daily. FOR BLOOD PRESSURE. -     CMP14+EGFR Continue all antihypertensives as prescribed.  Reminded to bring in blood pressure log for follow  up appointment.  RECOMMENDATIONS: DASH/Mediterranean Diets are healthier choices for HTN.    Prediabetes -     POCT glycosylated hemoglobin (Hb A1C) -     metFORMIN (GLUCOPHAGE) 500 MG tablet; Take 2 tablets (1,000 mg total) by mouth daily with breakfast. For Diabetes -     CMP14+EGFR  Hypertriglyceridemia -     Lipid panel -     atorvastatin (LIPITOR) 10 MG tablet; Take 1 tablet (10 mg total) by mouth daily. For cholesterol INSTRUCTIONS: Work on a low fat, heart healthy diet and participate in regular aerobic exercise program by working out at least 150 minutes per week; 5 days a week-30 minutes per day. Avoid red meat/beef/steak,  fried foods. junk foods, sodas, sugary drinks, unhealthy snacking, alcohol and smoking.  Drink at least 80 oz of water per day and monitor your carbohydrate intake daily.    Inguinal hernia of left side without obstruction or gangrene -     Ambulatory referral to General Surgery Avoid heavy lifting    Patient has been counseled on age-appropriate routine health concerns for screening and prevention. These are reviewed and up-to-date. Referrals have been placed accordingly. Immunizations are up-to-date or declined.    Subjective:   Chief Complaint  Patient presents with   Medical Management of Chronic Issues    Austin Rubio 47 y.o. male presents to office today for follow up to HTN  VRI was used to communicate directly with patient for the entire encounter including providing detailed patient instructions.     He has Lipoma of the left clavicle. Denies any pain or tenderness with  palpation. Has been present for over 1.5 years. Not much growth over the past few years.    He has a history of umbilical hernia which was removed 2-3 years ago per his report and a right inguinal hernia which was removed 10 years ago. Today has concerns of left inguinal hernia. His job does Surveyor, quantity and heavy lifting. There is associated pain in the area as well.   HTN Blood pressure is well controlled. He is taking losartan 50 mg daily and amlodipine 10 mg daily as prescribed.  BP Readings from Last 3 Encounters:  10/03/23 130/73  03/07/23 130/79  09/06/22 131/71      Review of Systems  Constitutional:  Negative for fever, malaise/fatigue and weight loss.  HENT: Negative.  Negative for nosebleeds.   Eyes: Negative.  Negative for blurred vision, double vision and photophobia.  Respiratory: Negative.  Negative for cough and shortness of breath.   Cardiovascular: Negative.  Negative for chest pain, palpitations, leg swelling and PND.  Gastrointestinal: Negative.  Negative for heartburn, nausea and vomiting.  Musculoskeletal: Negative.  Negative for myalgias.  Skin:        SEE HPI  Neurological: Negative.  Negative for dizziness, focal weakness, seizures and headaches.  Psychiatric/Behavioral: Negative.  Negative for suicidal ideas.     Past Medical History:  Diagnosis Date   Complication of anesthesia    syncope after bgetting home from surgery.    Hyperlipidemia    Pre-diabetes  Unspecified essential hypertension 05/17/2013    Past Surgical History:  Procedure Laterality Date   HERNIA REPAIR     MELANOMA EXCISION Left 11/24/2021   Procedure: WIDE EXCISION OF LEFT CHEST SPINDLE CELL NEOPLASM;  Surgeon: Manus Rudd, MD;  Location: [facility];  Service: General;  Laterality: Left;  LMA   XI ROBOTIC ASSISTED VENTRAL HERNIA N/A 03/06/2020   Procedure: XI ROBOTIC ASSISTED VENTRAL HERNIA;  Surgeon: Leafy Ro, MD;  Location: [facility];  Service:  General;  Laterality: N/A;    Family History  Problem Relation Age of Onset   Diabetes Mother    Heart disease Father     Social History Reviewed with no changes to be made today.   Outpatient Medications Prior to Visit  Medication Sig Dispense Refill   amLODipine (NORVASC) 10 MG tablet Take 1 tablet (10 mg total) by mouth daily. For blood pressure 90 tablet 1   naproxen (NAPROSYN) 500 MG tablet Take 1 tablet (500 mg total) by mouth 2 (two) times daily with a meal. For back pain 60 tablet 1   triamcinolone (KENALOG) 0.025 % ointment Apply to skin rash 2 (two) times daily As needed. 30 g 0   atorvastatin (LIPITOR) 10 MG tablet Take 1 tablet (10 mg total) by mouth daily. FOR CHOLESTEROL. 90 tablet 1   losartan (COZAAR) 50 MG tablet Take 1 tablet (50 mg total) by mouth daily. For blood pressure. 90 tablet 1   metFORMIN (GLUCOPHAGE) 500 MG tablet Take 2 tablets (1,000 mg total) by mouth daily with breakfast. 180 tablet 1   No facility-administered medications prior to visit.    No Known Allergies     Objective:    BP 130/73 (BP Location: Left Arm, Patient Position: Sitting, Cuff Size: Large)   Pulse 71   Ht 5\' 5"  (1.651 m)   Wt 213 lb (96.6 kg)   SpO2 98%   BMI 35.45 kg/m  Wt Readings from Last 3 Encounters:  10/03/23 213 lb (96.6 kg)  03/07/23 209 lb 6.4 oz (95 kg)  09/06/22 212 lb 9.6 oz (96.4 kg)    Physical Exam Vitals and nursing note reviewed.  Constitutional:      Appearance: He is well-developed.  HENT:     Head: Normocephalic and atraumatic.  Neck:   Cardiovascular:     Rate and Rhythm: Normal rate and regular rhythm.     Heart sounds: Normal heart sounds. No murmur heard.    No friction rub. No gallop.  Pulmonary:     Effort: Pulmonary effort is normal. No tachypnea or respiratory distress.     Breath sounds: Normal breath sounds. No decreased breath sounds, wheezing, rhonchi or rales.  Chest:     Chest wall: No tenderness.  Abdominal:     General:  Bowel sounds are normal.     Palpations: Abdomen is soft.     Hernia: A hernia is present. Hernia is present in the left inguinal area.    Musculoskeletal:        General: Normal range of motion.     Cervical back: Normal range of motion.  Skin:    General: Skin is warm and dry.  Neurological:     Mental Status: He is alert and oriented to person, place, and time.     Coordination: Coordination normal.  Psychiatric:        Behavior: Behavior normal. Behavior is cooperative.        Thought Content: Thought content normal.  Judgment: Judgment normal.          Patient has been counseled extensively about nutrition and exercise as well as the importance of adherence with medications and regular follow-up. The patient was given clear instructions to go to ER or return to medical center if symptoms don't improve, worsen or new problems develop. The patient verbalized understanding.   Follow-up: Return in about 3 months (around 01/01/2024).   Claiborne Rigg, FNP-BC [facility] [place], [place] [phone]   10/03/2023, 3:19 PM

## 2023-10-04 LAB — CMP14+EGFR
ALT: 42 [IU]/L (ref 0–44)
AST: 32 [IU]/L (ref 0–40)
Albumin: 4.2 g/dL (ref 4.1–5.1)
Alkaline Phosphatase: 100 [IU]/L (ref 44–121)
BUN/Creatinine Ratio: 21 — ABNORMAL HIGH (ref 9–20)
BUN: 13 mg/dL (ref 6–24)
Bilirubin Total: 0.3 mg/dL (ref 0.0–1.2)
CO2: 25 mmol/L (ref 20–29)
Calcium: 9.2 mg/dL (ref 8.7–10.2)
Chloride: 103 mmol/L (ref 96–106)
Creatinine, Ser: 0.63 mg/dL — ABNORMAL LOW (ref 0.76–1.27)
Globulin, Total: 2.5 g/dL (ref 1.5–4.5)
Glucose: 140 mg/dL — ABNORMAL HIGH (ref 70–99)
Potassium: 4.5 mmol/L (ref 3.5–5.2)
Sodium: 141 mmol/L (ref 134–144)
Total Protein: 6.7 g/dL (ref 6.0–8.5)
eGFR: 117 mL/min/{1.73_m2} (ref >59)

## 2023-10-04 LAB — LIPID PANEL
Chol/HDL Ratio: 3.3 {ratio} (ref 0.0–5.0)
Cholesterol, Total: 174 mg/dL (ref 100–199)
HDL: 53 mg/dL (ref >39)
LDL Chol Calc (NIH): 98 mg/dL (ref 0–99)
Triglycerides: 132 mg/dL (ref 0–149)
VLDL Cholesterol Cal: 23 mg/dL (ref 5–40)

## 2023-10-06 ENCOUNTER — Other Ambulatory Visit: Payer: Self-pay

## 2023-10-10 ENCOUNTER — Ambulatory Visit (INDEPENDENT_AMBULATORY_CARE_PROVIDER_SITE_OTHER): Payer: Self-pay | Admitting: Surgery

## 2023-10-10 ENCOUNTER — Encounter: Payer: Self-pay | Admitting: Surgery

## 2023-10-10 VITALS — BP 125/78 | HR 75 | Temp 98.0°F | Wt 206.6 lb

## 2023-10-10 DIAGNOSIS — K4090 Unilateral inguinal hernia, without obstruction or gangrene, not specified as recurrent: Secondary | ICD-10-CM

## 2023-10-10 NOTE — Progress Notes (Signed)
10/10/2023  Reason for Visit: Left inguinal hernia  Requesting Provider: Bertram Denver, NP  History of Present Illness: Austin Rubio is a 47 y.o. male presenting for evaluation of a left inguinal hernia.  The patient has had a prior open right inguinal hernia repair with mesh in 2005 and also had a robotic assisted umbilical hernia repair with Dr. Everlene Farrier in May 2021.  At that point he was also noticed to have a left inguinal hernia which was small in size and this was not repaired at the time.  The patient reports that he had been doing well without any issues from that standpoint until more recently over the last few months that he has noticed some more burning sensation and a bulging sensation in the left groin.  Reports that overall initially that hernia was very small and has been growing in size and now he can feel a small bulge in the left groin area.  The burning sensation radiates towards the testicle.  The discomfort sometimes can last a couple of days but goes away on its own.  He is able to push the hernia contents back in.  He denies any nausea or vomiting, constipation, diarrhea, or urinary difficulties.  He has healed well from the umbilical hernia repair in the right groin and denies any issues with either area.  Past Medical History: Past Medical History:  Diagnosis Date   Complication of anesthesia    syncope after bgetting home from surgery.    Hyperlipidemia    Pre-diabetes    Unspecified essential hypertension 05/17/2013     Past Surgical History: Past Surgical History:  Procedure Laterality Date   HERNIA REPAIR     MELANOMA EXCISION Left 11/24/2021   Procedure: WIDE EXCISION OF LEFT CHEST SPINDLE CELL NEOPLASM;  Surgeon: Manus Rudd, MD;  Location: [facility];  Service: General;  Laterality: Left;  LMA   XI ROBOTIC ASSISTED VENTRAL HERNIA N/A 03/06/2020   Procedure: XI ROBOTIC ASSISTED VENTRAL HERNIA;  Surgeon: Leafy Ro, MD;  Location: [facility];   Service: General;  Laterality: N/A;    Home Medications: Prior to Admission medications   Medication Sig Start Date End Date Taking? Authorizing Provider  amLODipine (NORVASC) 10 MG tablet Take 1 tablet (10 mg total) by mouth daily. For blood pressure 03/07/23  Yes Claiborne Rigg, NP  atorvastatin (LIPITOR) 10 MG tablet Take 1 tablet (10 mg total) by mouth daily. For cholesterol 10/03/23  Yes Claiborne Rigg, NP  losartan (COZAAR) 50 MG tablet Take 1 tablet (50 mg total) by mouth daily. FOR BLOOD PRESSURE. 10/03/23  Yes Claiborne Rigg, NP  metFORMIN (GLUCOPHAGE) 500 MG tablet Take 2 tablets (1,000 mg total) by mouth daily with breakfast. For Diabetes 10/03/23  Yes Claiborne Rigg, NP  naproxen (NAPROSYN) 500 MG tablet Take 1 tablet (500 mg total) by mouth 2 (two) times daily with a meal. For back pain 03/07/23  Yes Claiborne Rigg, NP  triamcinolone (KENALOG) 0.025 % ointment Apply to skin rash 2 (two) times daily As needed. 06/04/22  Yes Claiborne Rigg, NP    Allergies: No Known Allergies  Social History:  reports that he has never smoked. He has never used smokeless tobacco. He reports that he does not drink alcohol and does not use drugs.   Family History: Family History  Problem Relation Age of Onset   Diabetes Mother    Heart disease Father     Review of Systems: Review of  Systems  Constitutional:  Negative for chills and fever.  HENT:  Negative for hearing loss.   Respiratory:  Negative for shortness of breath.   Cardiovascular:  Negative for chest pain.  Gastrointestinal:  Positive for abdominal pain (left groin burning sensation). Negative for constipation, diarrhea, nausea and vomiting.  Genitourinary:  Negative for dysuria.  Musculoskeletal:  Negative for myalgias.  Skin:  Negative for rash.  Neurological:  Negative for dizziness.  Psychiatric/Behavioral:  Negative for depression.     Physical Exam BP 125/78   Pulse 75   Temp 98 F (36.7 C) (Oral)   Wt 206 lb  9.6 oz (93.7 kg)   SpO2 97%   BMI 34.38 kg/m  CONSTITUTIONAL: No acute distress HEENT:  Normocephalic, atraumatic, extraocular motion intact. NECK: Trachea is midline, and there is no jugular venous distension.  RESPIRATORY:  Lungs are clear, and breath sounds are equal bilaterally. Normal respiratory effort without pathologic use of accessory muscles. CARDIOVASCULAR: Heart is regular without murmurs, gallops, or rubs. GI: The abdomen is soft, nondistended, with some discomfort to palpation in the left groin.  The patient has prior right inguinal hernia and left-sided abdominal incisions from umbilical hernia repair which are all well-healed without any evidence of hernia recurrence in either the right groin or the umbilicus.  In the left groin, the patient does have what appears to be a direct inguinal hernia which is limited to the groin itself without any extension to the scrotum.  There are some discomfort with coughing and when reducing the hernia but is otherwise easily reducible.  MUSCULOSKELETAL:  Normal muscle strength and tone in all four extremities.  No peripheral edema or cyanosis. SKIN: Skin turgor is normal. There are no pathologic skin lesions.  NEUROLOGIC:  Motor and sensation is grossly normal.  Cranial nerves are grossly intact. PSYCH:  Alert and oriented to person, place and time. Affect is normal.  Laboratory Analysis: Labs from 10/03/2023: Sodium 141, potassium 4.5, chloride 103, CO2 25, BUN 13, creatinine 0.63.  Total bilirubin 0.3, AST 32, ALT 42, alkaline phosphatase 100, albumin 4.2.  Hemoglobin A1c 6.0  Imaging: No results found.  Assessment and Plan: This is a 47 y.o. male with a left inguinal hernia.  - Discussed with patient the findings on exam today which confirm a left-sided inguinal hernia.  Based on location, it could possibly be a direct inguinal hernia.  Discussed with the patient that the burning sensation that he is feeling may be related to the nerves  in the inguinal region that are getting aggravated by the bulging sensation.  The hernia is reducible and although there is some mild discomfort there is no evidence of incarceration or strangulation.  Discussed with patient the natural progression for hernias would be to get bigger in size and started with symptoms which is what has been happening with the patient.  As such I would recommend surgical repair. - Discussed with the patient the potential options for repair which would include a robotic assisted left inguinal hernia repair which would mean making incisions in the abdomen, particularly at the umbilical area which would traverse the previously placed mesh versus proceeding with surgery in the more traditional fashion with the incision of the left groin instead.  After further discussion, the patient prefers to proceed with an open inguinal hernia repair. - Discussed with him then the plan for open left inguinal hernia repair with mesh and reviewed the procedure at length with him including the planned incision, risks of bleeding,  infection, injury to surrounding structures, the use of mesh, postoperative activity restrictions, pain control, that this would be an outpatient procedure, and he is willing to proceed. - We will schedule him for surgery on 11/10/2023 at the patient's request.  All his questions have been answered.  I spent 55 minutes dedicated to the care of this patient on the date of this encounter to include pre-visit review of records, face-to-face time with the patient discussing diagnosis and management, and any post-visit coordination of care.   Howie Ill, MD [facility]

## 2023-10-10 NOTE — Patient Instructions (Addendum)
Nuestra programadora de [place], [place], lo llamar dentro de las 24 a 48 horas para Charity fundraiser su cita. Si no ha tenido noticias de ella despus de 48 horas, llame a nuestro consultorio. Tenga a mano la hoja azul cuando lo llame para anotar la informacin importante.   Hernia inguinal en los adultos Inguinal Hernia, Adult Una hernia inguinal se produce cuando la grasa o los intestinos empujan a travs de una zona dbil de un msculo donde se unen la pierna y el abdomen inferior (ingle). Esto produce un bulto. Este tipo de hernia tambin puede aparecer en: En el escroto, si es varn. En los pliegues de la piel alrededor de la vagina, si es [place]. Existen tres tipos de hernias inguinales: Hernias que se pueden empujar hacia adentro del abdomen (son reducibles). Este tipo de hernias rara vez provoca dolor. Hernias que no se pueden empujar hacia adentro del abdomen (encarceladas). Hernias que no se pueden empujar hacia adentro del abdomen y pierden la irrigacin sangunea (estranguladas). Este tipo de hernia requiere ciruga de Associate Professor. Cules son las causas? Esta afeccin se produce cuando tiene una zona dbil en los msculos o en los tejidos de la ingle. Esto se desarrolla con el tiempo. La hernia puede salirse por la zona dbil cuando ejerce un esfuerzo [employer] de la parte baja del vientre de [place] repentina, por ejemplo, cuando usted: Levanta un objeto pesado. Hace esfuerzos para mover el vientre (defecar). La dificultad para mover el vientre (estreimiento) puede derivar en un esfuerzo. Tos. Qu incrementa el riesgo? Es ms probable que Dietitian en: Los hombres. [place]. Personas que: Tienen sobrepeso. Realizan trabajos que requieren Theme park manager de pie durante largos perodos o levantar cargas pesadas. Han tenido una hernia inguinal previamente. Fuman o tienen una enfermedad pulmonar. Estos factores pueden causar tos a largo plazo  (crnica). Cules son los signos o sntomas? Los sntomas pueden depender del tamao de la hernia. Con frecuencia, una pequea hernia no tiene sntomas. Estos son algunos de los sntomas de una hernia ms grande: Un bulto en la zona de la ingle. Este es fcil de ver cuando se encuentra de pie. Es posible que no pueda verlo cuando est recostado. Dolor o ardor en la ingle. Esto podra empeorar cuando levanta un objeto, realiza un esfuerzo o tose. Un dolor sordo o una sensacin de presin en la ingle. Un bulto anormal en el escroto, en los hombres. Los sntomas de una hernia inguinal estrangulada pueden incluir lo siguiente: Un bulto en la ingle que es muy doloroso y sensible al tacto. Un bulto que se torna de color rojo o prpura. Fiebre, sensacin de que va a vomitar (nuseas) y vmitos. No poder mover el vientre ni expulsar gases. Cmo se trata? El tratamiento depende del tamao de la hernia y de si tiene sntomas o no. Si no tiene sntomas, el mdico podr indicarle que controle cuidadosamente la hernia y que concurra a las visitas de seguimiento. Si tiene sntomas o una hernia grande, es posible que necesite ciruga para repararla. Siga estas instrucciones en su casa: Estilo de vida Evite levantar objetos pesados. Evite estar de pie durante mucho tiempo. No fume ni consuma ningn producto que contenga nicotina o tabaco. Si necesita ayuda para dejar de fumar, consulte al mdico. Mantenga un peso saludable. Evite la dificultad para defecar Es posible que deba tomar estas medidas para prevenir o tratar los problemas para defecar: Product manager suficiente lquido para Radio producer pis (orina) de color amarillo plido. Usar  medicamentos recetados o de [employer] [place] alimentos ricos en fibra. Entre ellos, frijoles, cereales integrales y frutas y verduras frescas. Limitar los alimentos con alto contenido de grasa y International aid/development worker. Estos incluyen alimentos fritos o dulces. Instrucciones generales Puede  intentar empujar la hernia hacia adentro presionando muy suavemente sobre esta cuando est acostado. No intente empujar el bulto hacia adentro si este no entra fcilmente. Observe si la hernia cambia de forma, de color o de tamao. Informe al mdico si observa algn cambio. Use los medicamentos de venta libre y los recetados solamente como se lo haya indicado el mdico. Cumpla con todas las visitas de seguimiento. Comunquese con un mdico si: Tiene fiebre o escalofros. Aparecen nuevos sntomas. Sus sntomas empeoran. Solicite ayuda de inmediato si: Tiene dolor en la ingle que empeora de repente. Tiene un bulto en la ingle que tiene las siguientes caractersticas: Se agranda de repente y no se encoge despus. Se pone rojo o morado. Le duele al tocarlo. Usted es hombre y tiene: Dolor repentino en el escroto. Un cambio repentino en el tamao del escroto. No puede volver a Electrical engineer hernia en su lugar al ejercer sobre esta una presin muy suave mientras est acostado. Siente ganas de vomitar y la sensacin no desaparece. Sigue vomitando. Tiene latidos cardacos acelerados. No puede mover el vientre o expulsar gases. Estos sntomas pueden Customer service manager. Solicite ayuda de inmediato. Comunquese con el servicio de emergencias de su localidad (911 en los [place]). No espere a ver si los sntomas desaparecen. No conduzca por sus propios medios [employer] [facility]. Resumen Una hernia inguinal se produce cuando la grasa o los intestinos empujan a travs de una zona dbil de un msculo donde se unen la pierna y el abdomen inferior (ingle). Esto produce un bulto. Si no tiene sntomas, es posible que no necesite tratamiento. Si tiene sntomas o una hernia grande, es posible que necesite [place]. Evite levantar objetos pesados. Tambin evite estar de pie durante mucho tiempo. No intente empujar el bulto hacia adentro si este no entra fcilmente. Esta informacin no tiene Microbiologist el consejo del mdico. Asegrese de hacerle al mdico cualquier pregunta que tenga. Document Revised: 08/18/2020 Document Reviewed: 07/04/2020 Elsevier Patient Education  2024 [employer].

## 2023-10-11 ENCOUNTER — Telehealth: Payer: Self-pay | Admitting: Surgery

## 2023-10-11 NOTE — Telephone Encounter (Signed)
Patient has been advised of Pre-Admission date/time, and Surgery date at [facility]. Also mailed pink sheet regarding surgery.   Surgery Date: 11/10/23 Preadmission Testing Date: 11/03/23 (phone 1p-4p)  Patient has been made aware to call [phone], between 1-3:00pm the day before surgery, to find out what time to arrive for surgery.

## 2023-11-03 ENCOUNTER — Encounter
Admission: RE | Admit: 2023-11-03 | Discharge: 2023-11-03 | Disposition: A | Discharge status: Home / Self Care | Payer: Self-pay | Source: Ambulatory Visit | Attending: Surgery | Admitting: Surgery

## 2023-11-03 ENCOUNTER — Other Ambulatory Visit: Payer: Self-pay

## 2023-11-03 VITALS — Ht 65.0 in | Wt 206.0 lb

## 2023-11-03 DIAGNOSIS — Z01810 Encounter for preprocedural cardiovascular examination: Secondary | ICD-10-CM

## 2023-11-03 DIAGNOSIS — Z01812 Encounter for preprocedural laboratory examination: Secondary | ICD-10-CM

## 2023-11-03 DIAGNOSIS — I10 Essential (primary) hypertension: Secondary | ICD-10-CM

## 2023-11-03 HISTORY — DX: Unilateral inguinal hernia, without obstruction or gangrene, not specified as recurrent: K40.90

## 2023-11-03 NOTE — Patient Instructions (Addendum)
 Su trmite est programado para el: jueves 9 de enero Environmental health practitioner de chartered certified accountant piso del [employer] Para saber su hora de llegada, llame al [phone] entre la 1:00 p. m. y las 3:00 p. m. el mircoles 8 de enero. Si su hora de llegada es a las 6:00 am, no llegue antes de esa hora ya que las puertas de entrada del [facility] no se abren teacher, adult education las 6:00 am.  RECUERDA: Las instrucciones que no se siguen completamente pueden generar riesgos mdicos graves, que pueden llegar hasta la [place]; o, segn el criterio de su cirujano y scientific laboratory technician, es posible que sea aeronautical engineer su leisure centre manager.  No ingiera alimentos despus de la medianoche del da anterior a la ciruga.  No mascar chicle ni caramelos duros.  Sin embargo, puede beber lquidos [employer] 2 horas antes de la fecha prevista de llegada a la ciruga. No beba nada dentro de las 2 horas anteriores a su hora de nurse, learning disability.  Los lquidos claros incluyen: - agua  - jugo de manzana sin pulpa - gatorade (no colores ROJOS) - caf o t negro (NO agregue leche ni cremas al caf o t) NO beba nada que no est en esta lista.  Una semana antes de la ciruga: a glass blower/designer del 2 de enero Detenga los antiinflamatorios (AINE) como Advil, Aleve , Ibuprofeno, Motrin, Naproxen , Naprosyn  y productos a base de aspirina como Excedrin, Goody's Powder, BC Powder. Suspenda CUALQUIER suplemento de venta libre hasta despus de la ciruga.  Sin embargo, puede educational psychologist tomando Tylenol  si es necesario para press photographer da de la ciruga.  Metformina: mantngala durante 2 das antes de la ciruga. El ltimo da para tomar Metformina es el lunes 6 de enero. Reanudar DESPUS de la ciruga.  Contine tomando todos sus dems medicamentos recetados hasta el da de la ciruga.  EL DA DE LA CIRUGA SLO TOMA ESTOS MEDICAMENTOS CON sorbos de agua:  1. amlodipino  No consumir alcohol durante 24 horas antes o despus de  la ciruga.  No fumar, incluidos los cigarrillos electrnicos, durante las 24 horas previas a la ciruga.  No consumir productos de tabaco masticables durante al menos 6 horas antes de la ciruga.  Sin parches de optometrist de la ciruga.  No use ningn medicamento recreativo durante al menos una semana (preferiblemente 2 semanas) antes de la ciruga.  Tenga en cuenta que la combinacin de cocana y anestesia puede tener resultados negativos, que pueden llegar hasta la [place]. Si su prueba de cocana da positivo, su ciruga ser cancelada.  La maana de la ciruga cepille sus dientes con pasta dental y agua, puede enjuagarse la boca con enjuague bucal si lo desea. No ingiera pasta de dientes ni enjuague bucal.  Utilice el jabn CHG como se indica en la hoja de instrucciones.  No use joyas, maquillaje, horquillas, clips ni esmalte de uas.  Para joyera soldada (permanente): pulseras, tobilleras, cinturillas, etc. Retrelos antes de la ciruga.  Si no se extrae, existe la posibilidad de que el personal del hospital deba cortrselo el da de la ciruga.  No use lociones, polvos ni perfumes.   No se afeite el vello corporal desde el cuello hacia abajo 48 horas antes de la ciruga.  No se pueden usar lentes de contacto, audfonos ni dentaduras postizas durante la ciruga.  No lleve objetos de valor al hospital. [facility] no es responsable de ninguna pertenencia u objeto de valor perdido o perdido.  Notifique a su mdico si hay algn cambio en su condicin mdica (resfriado, fiebre, infeccin).  Lleve ropa cmoda (especfica para su tipo de ciruga) al hospital.  Despus de la ciruga, usted puede ayudar a prevenir complicaciones pulmonares haciendo ejercicios de respiracin.  Respire profundamente y tosa cada 1 o 2 horas. Su mdico puede indicarle un dispositivo llamado espirmetro incentivador para ayudarle a respirar profundamente. Al toser o engineering geologist, sostenga firmemente  una almohada contra la incisin con ambas manos. Esto se llama ferulizacin. Hacer esto ayuda a proteger su incisin. Tambin disminuye las molestias abdominales.  Si le dan el alta el da de la [place], no se le permitir conducir a casa. Necesitar que una persona responsable lo lleve a su casa y se quede con usted durante las 24 horas posteriores a la ciruga.   Si viaja en transporte pblico, deber ir acompaado de una persona responsable.  Llame al Departamento de pruebas previas a la admisin al [phone] si tiene alguna pregunta sobre estas instrucciones.  Poltica de visitas a ciruga:  Los lyondell chemical se someten a una ciruga o un procedimiento pueden delphi visitantes.  Los nios menores de 16 aos deben estar acompaados por un adulto que no sea el [place].    Preparacin para la ciruga con jabn de GLUCONATO DE CLORHEXIDINA (CHG)  Jabn de gluconato de clorhexidina (CHG)  * Un limpiador antisptico que [employer] grmenes y se adhiere a la piel para seguir colgate palmolive grmenes incluso despus del lavado.   *Se utiliza para ducharse la noche anterior a la ciruga y la maana de la ciruga.  Antes de la ciruga, usted puede desempear un papel importante al reducir la cantidad de grmenes en su piel. El jabn CHG (gluconato de clorhexidina) es un limpiador antisptico que mata los grmenes y se adhiere a la piel para continuar matndolos incluso despus del lavado.  No lo utilice si es alrgico al CHG o a los jabones antibacterianos. Si su piel se enrojece o irrita, deje de usar CHG.  1. Ducharse la NOCHE ANTES DE LA CIRUGA y la [place] DE LA CIRUGA con jabn CHG.  2. Si eliges lavarte el cabello, lvalo primero como de costumbre con tu champ habitual.  3. Despus del champ, enjuague bien el cabello y el cuerpo para eliminar el champ.  4. Utilice CHG como lo hara con cualquier otro jabn lquido. Puede aplicar CHG directamente sobre la piel y lavar  suavemente con un pauelo o una toallita limpia.  5. Aplique el jabn CHG en su cuerpo nicamente desde el cuello hacia abajo. No utilizar en heridas abiertas o llagas abiertas. Evite el contacto con los ojos, odos, boca y genitales (partes privadas). Lvese la cara y los genitales (partes privadas) con su jabn habitual.  6. Lvese bien, prestando especial atencin al rea donde se realizar su ciruga.  7. Enjuague bien su cuerpo con agua tibia.  8. No se duche ni se lave con su jabn normal despus de usar y enjuagar el jabn CHG.  9. Squese dando palmaditas con una toalla limpia.  10. Use pijamas limpios para dormir la noche anterior a la ciruga.  12. Coloque sbanas limpias en su cama la noche de su primera ducha y no duerma con mascotas.  13. Ducharse nuevamente con el jabn CHG el da de la ciruga antes de llegar al hospital.  14. No aplique desodorantes, lociones o polvos.  15. Por favor use ropa limpia al hospital.   ENGLISH VERSION:  Your procedure is scheduled on: Thursday, January 9 Report to the Registration Desk on the 1st floor of the [employer] To find out your arrival time, please call [phone] between 1PM - 3PM on: Wednesday, January 8 If your arrival time is 6:00 am, do not arrive before that time as the [facility] entrance doors do not open until 6:00 am.  REMEMBER: Instructions that are not followed completely may result in serious medical risk, up to and including death; or upon the discretion of your surgeon and anesthesiologist your surgery may need to be rescheduled.  Do not eat food after midnight the night before surgery.  No gum chewing or hard candies.  You may however, drink CLEAR liquids up to 2 hours before you are scheduled to arrive for your surgery. Do not drink anything within 2 hours of your scheduled arrival time.  Clear liquids include: - water  - apple juice without pulp - gatorade (not RED colors) - black coffee or  tea (Do NOT add milk or creamers to the coffee or tea) Do NOT drink anything that is not on this list.  One week prior to surgery: starting January 2 Stop Anti-inflammatories (NSAIDS) such as Advil, Aleve , Ibuprofen, Motrin, Naproxen , Naprosyn  and Aspirin based products such as Excedrin, Goody's Powder, BC Powder. Stop ANY OVER THE COUNTER supplements until after surgery.  You may however, continue to take Tylenol  if needed for pain up until the day of surgery.  Metformin  - hold for 2 days before surgery. Last day to take Metformin  is Monday, January 6. Resume AFTER surgery.  Continue taking all of your other prescription medications up until the day of surgery.  ON THE DAY OF SURGERY ONLY TAKE THESE MEDICATIONS WITH SIPS OF WATER:  Amlodipine   No Alcohol for 24 hours before or after surgery.  No Smoking including e-cigarettes for 24 hours before surgery.  No chewable tobacco products for at least 6 hours before surgery.  No nicotine patches on the day of surgery.  Do not use any recreational drugs for at least a week (preferably 2 weeks) before your surgery.  Please be advised that the combination of cocaine and anesthesia may have negative outcomes, up to and including death. If you test positive for cocaine, your surgery will be cancelled.  On the morning of surgery brush your teeth with toothpaste and water, you may rinse your mouth with mouthwash if you wish. Do not swallow any toothpaste or mouthwash.  Use CHG Soap as directed on instruction sheet.  Do not wear jewelry, make-up, hairpins, clips or nail polish.  For welded (permanent) jewelry: bracelets, anklets, waist bands, etc.  Please have this removed prior to surgery.  If it is not removed, there is a chance that hospital personnel will need to cut it off on the day of surgery.  Do not wear lotions, powders, or perfumes.   Do not shave body hair from the neck down 48 hours before surgery.  Contact lenses, hearing  aids and dentures may not be worn into surgery.  Do not bring valuables to the hospital. [facility] is not responsible for any missing/lost belongings or valuables.   Notify your doctor if there is any change in your medical condition (cold, fever, infection).  Wear comfortable clothing (specific to your surgery type) to the hospital.  After surgery, you can help prevent lung complications by doing breathing exercises.  Take deep breaths and cough every 1-2 hours. Your doctor may order a device called an  Incentive Spirometer to help you take deep breaths. When coughing or sneezing, hold a pillow firmly against your incision with both hands. This is called "splinting." Doing this helps protect your incision. It also decreases belly discomfort.  If you are being discharged the day of surgery, you will not be allowed to drive home. You will need a responsible individual to drive you home and stay with you for 24 hours after surgery.   If you are taking public transportation, you will need to have a responsible individual with you.  Please call the Pre-admissions Testing Dept. at [phone] if you have any questions about these instructions.  Surgery Visitation Policy:  Patients having surgery or a procedure may have two visitors.  Children under the age of 26 must have an adult with them who is not the patient.     Preparing for Surgery with CHLORHEXIDINE  GLUCONATE (CHG) Soap  Chlorhexidine  Gluconate (CHG) Soap  o An antiseptic cleaner that kills germs and bonds with the skin to continue killing germs even after washing  o Used for showering the night before surgery and morning of surgery  Before surgery, you can play an important role by reducing the number of germs on your skin.  CHG (Chlorhexidine  gluconate) soap is an antiseptic cleanser which kills germs and bonds with the skin to continue killing germs even after washing.  Please do not use if you have an allergy to CHG  or antibacterial soaps. If your skin becomes reddened/irritated stop using the CHG.  1. Shower the NIGHT BEFORE SURGERY and the MORNING OF SURGERY with CHG soap.  2. If you choose to wash your hair, wash your hair first as usual with your normal shampoo.  3. After shampooing, rinse your hair and body thoroughly to remove the shampoo.  4. Use CHG as you would any other liquid soap. You can apply CHG directly to the skin and wash gently with a scrungie or a clean washcloth.  5. Apply the CHG soap to your body only from the neck down. Do not use on open wounds or open sores. Avoid contact with your eyes, ears, mouth, and genitals (private parts). Wash face and genitals (private parts) with your normal soap.  6. Wash thoroughly, paying special attention to the area where your surgery will be performed.  7. Thoroughly rinse your body with warm water.  8. Do not shower/wash with your normal soap after using and rinsing off the CHG soap.  9. Pat yourself dry with a clean towel.  10. Wear clean pajamas to bed the night before surgery.  12. Place clean sheets on your bed the night of your first shower and do not sleep with pets.  13. Shower again with the CHG soap on the day of surgery prior to arriving at the hospital.  14. Do not apply any deodorants/lotions/powders.  15. Please wear clean clothes to the hospital.

## 2023-11-04 ENCOUNTER — Encounter
Admission: RE | Admit: 2023-11-04 | Discharge: 2023-11-04 | Disposition: A | Discharge status: Home / Self Care | Payer: Self-pay | Source: Ambulatory Visit | Attending: Surgery | Admitting: Surgery

## 2023-11-04 DIAGNOSIS — Z01818 Encounter for other preprocedural examination: Secondary | ICD-10-CM | POA: Insufficient documentation

## 2023-11-04 DIAGNOSIS — Z01810 Encounter for preprocedural cardiovascular examination: Secondary | ICD-10-CM

## 2023-11-04 DIAGNOSIS — Z01812 Encounter for preprocedural laboratory examination: Secondary | ICD-10-CM

## 2023-11-04 DIAGNOSIS — I10 Essential (primary) hypertension: Secondary | ICD-10-CM | POA: Insufficient documentation

## 2023-11-04 LAB — CBC
HCT: 41.8 % (ref 39.0–52.0)
Hemoglobin: 14.3 g/dL (ref 13.0–17.0)
MCH: 29.9 pg (ref 26.0–34.0)
MCHC: 34.2 g/dL (ref 30.0–36.0)
MCV: 87.4 fL (ref 80.0–100.0)
Platelets: 297 10*3/uL (ref 150–400)
RBC: 4.78 MIL/uL (ref 4.22–5.81)
RDW: 12.5 % (ref 11.5–15.5)
WBC: 6 10*3/uL (ref 4.0–10.5)
nRBC: 0 % (ref 0.0–0.2)

## 2023-11-10 ENCOUNTER — Ambulatory Visit: Payer: Self-pay | Admitting: Anesthesiology

## 2023-11-10 ENCOUNTER — Ambulatory Visit
Admission: RE | Admit: 2023-11-10 | Discharge: 2023-11-10 | Disposition: A | Discharge status: Home / Self Care | Payer: Self-pay | Attending: Surgery | Admitting: Surgery

## 2023-11-10 ENCOUNTER — Encounter
Admission: RE | Disposition: A | Discharge status: Home / Self Care | Payer: Self-pay | Source: Home / Self Care | Attending: Surgery

## 2023-11-10 ENCOUNTER — Other Ambulatory Visit: Payer: Self-pay

## 2023-11-10 ENCOUNTER — Encounter: Payer: Self-pay | Admitting: Surgery

## 2023-11-10 DIAGNOSIS — G44209 Tension-type headache, unspecified, not intractable: Secondary | ICD-10-CM

## 2023-11-10 DIAGNOSIS — Z833 Family history of diabetes mellitus: Secondary | ICD-10-CM | POA: Insufficient documentation

## 2023-11-10 DIAGNOSIS — K4090 Unilateral inguinal hernia, without obstruction or gangrene, not specified as recurrent: Secondary | ICD-10-CM

## 2023-11-10 DIAGNOSIS — Z79899 Other long term (current) drug therapy: Secondary | ICD-10-CM | POA: Insufficient documentation

## 2023-11-10 DIAGNOSIS — I10 Essential (primary) hypertension: Secondary | ICD-10-CM | POA: Insufficient documentation

## 2023-11-10 DIAGNOSIS — Z7984 Long term (current) use of oral hypoglycemic drugs: Secondary | ICD-10-CM | POA: Insufficient documentation

## 2023-11-10 DIAGNOSIS — E119 Type 2 diabetes mellitus without complications: Secondary | ICD-10-CM | POA: Insufficient documentation

## 2023-11-10 DIAGNOSIS — Z8249 Family history of ischemic heart disease and other diseases of the circulatory system: Secondary | ICD-10-CM | POA: Insufficient documentation

## 2023-11-10 DIAGNOSIS — D176 Benign lipomatous neoplasm of spermatic cord: Secondary | ICD-10-CM | POA: Insufficient documentation

## 2023-11-10 HISTORY — PX: INSERTION OF MESH: SHX5868

## 2023-11-10 HISTORY — PX: INGUINAL HERNIA REPAIR: SHX194

## 2023-11-10 SURGERY — REPAIR, HERNIA, INGUINAL, ADULT
Anesthesia: General | Laterality: Left

## 2023-11-10 MED ORDER — GABAPENTIN 300 MG PO CAPS
300.0000 mg | ORAL_CAPSULE | ORAL | Status: AC
  Administered 2023-11-10: 300 mg via ORAL

## 2023-11-10 MED ORDER — BUPIVACAINE-EPINEPHRINE (PF) 0.25% -1:200000 IJ SOLN
INTRAMUSCULAR | Status: AC
  Filled 2023-11-10: qty 30

## 2023-11-10 MED ORDER — KETOROLAC TROMETHAMINE 30 MG/ML IJ SOLN
INTRAMUSCULAR | Status: DC | PRN
  Administered 2023-11-10: 30 mg via INTRAVENOUS

## 2023-11-10 MED ORDER — LIDOCAINE HCL (CARDIAC) PF 100 MG/5ML IV SOSY
PREFILLED_SYRINGE | INTRAVENOUS | Status: DC | PRN
  Administered 2023-11-10: 100 mg via INTRAVENOUS

## 2023-11-10 MED ORDER — OXYCODONE HCL 5 MG PO TABS
5.0000 mg | ORAL_TABLET | ORAL | 0 refills | Status: DC | PRN
  Filled 2023-11-10: qty 30, 5d supply, fill #0

## 2023-11-10 MED ORDER — CHLORHEXIDINE GLUCONATE 0.12 % MT SOLN
15.0000 mL | Freq: Once | OROMUCOSAL | Status: AC
  Administered 2023-11-10: 15 mL via OROMUCOSAL

## 2023-11-10 MED ORDER — BUPIVACAINE HCL (PF) 0.25 % IJ SOLN
INTRAMUSCULAR | Status: DC | PRN
  Administered 2023-11-10: 30 mL

## 2023-11-10 MED ORDER — CHLORHEXIDINE GLUCONATE 0.12 % MT SOLN
OROMUCOSAL | Status: AC
  Filled 2023-11-10: qty 15

## 2023-11-10 MED ORDER — BUPIVACAINE LIPOSOME 1.3 % IJ SUSP: 10.0000 mL | Freq: Once | INTRAMUSCULAR | Status: DC

## 2023-11-10 MED ORDER — DEXAMETHASONE SODIUM PHOSPHATE 10 MG/ML IJ SOLN
INTRAMUSCULAR | Status: AC
  Filled 2023-11-10: qty 1

## 2023-11-10 MED ORDER — DEXAMETHASONE SODIUM PHOSPHATE 10 MG/ML IJ SOLN
INTRAMUSCULAR | Status: DC | PRN
  Administered 2023-11-10: 8 mg via INTRAVENOUS

## 2023-11-10 MED ORDER — PROPOFOL 1000 MG/100ML IV EMUL
INTRAVENOUS | Status: AC
  Filled 2023-11-10: qty 100

## 2023-11-10 MED ORDER — ROCURONIUM BROMIDE 100 MG/10ML IV SOLN
INTRAVENOUS | Status: DC | PRN
  Administered 2023-11-10: 50 mg via INTRAVENOUS

## 2023-11-10 MED ORDER — FENTANYL CITRATE (PF) 100 MCG/2ML IJ SOLN
INTRAMUSCULAR | Status: AC
  Filled 2023-11-10: qty 2

## 2023-11-10 MED ORDER — PROPOFOL 10 MG/ML IV BOLUS
INTRAVENOUS | Status: AC
  Filled 2023-11-10: qty 20

## 2023-11-10 MED ORDER — CHLORHEXIDINE GLUCONATE CLOTH 2 % EX PADS: 6.0000 | MEDICATED_PAD | Freq: Once | CUTANEOUS | Status: DC

## 2023-11-10 MED ORDER — FENTANYL CITRATE (PF) 100 MCG/2ML IJ SOLN
25.0000 ug | INTRAMUSCULAR | Status: DC | PRN
  Administered 2023-11-10 (×2): 25 ug via INTRAVENOUS

## 2023-11-10 MED ORDER — SUGAMMADEX SODIUM 200 MG/2ML IV SOLN
INTRAVENOUS | Status: DC | PRN
  Administered 2023-11-10: 200 mg via INTRAVENOUS

## 2023-11-10 MED ORDER — LIDOCAINE HCL (PF) 2 % IJ SOLN
INTRAMUSCULAR | Status: AC
  Filled 2023-11-10: qty 5

## 2023-11-10 MED ORDER — BUPIVACAINE LIPOSOME 1.3 % IJ SUSP
INTRAMUSCULAR | Status: AC
  Filled 2023-11-10: qty 10

## 2023-11-10 MED ORDER — NAPROXEN 500 MG PO TABS
500.0000 mg | ORAL_TABLET | Freq: Two times a day (BID) | ORAL | 1 refills | Status: DC | PRN
  Filled 2023-11-10: qty 40, 20d supply, fill #0

## 2023-11-10 MED ORDER — OXYCODONE HCL 5 MG PO TABS
ORAL_TABLET | ORAL | Status: AC
  Filled 2023-11-10: qty 1

## 2023-11-10 MED ORDER — ACETAMINOPHEN 500 MG PO TABS: 1000.0000 mg | ORAL_TABLET | Freq: Four times a day (QID) | ORAL | Status: DC | PRN

## 2023-11-10 MED ORDER — ONDANSETRON HCL 4 MG/2ML IJ SOLN
INTRAMUSCULAR | Status: AC
  Filled 2023-11-10: qty 2

## 2023-11-10 MED ORDER — CEFAZOLIN SODIUM-DEXTROSE 2-4 GM/100ML-% IV SOLN
2.0000 g | INTRAVENOUS | Status: AC
  Administered 2023-11-10: 2 g via INTRAVENOUS

## 2023-11-10 MED ORDER — OXYCODONE HCL 5 MG PO TABS
5.0000 mg | ORAL_TABLET | Freq: Once | ORAL | Status: AC | PRN
  Administered 2023-11-10: 5 mg via ORAL

## 2023-11-10 MED ORDER — OXYCODONE HCL 5 MG/5ML PO SOLN: 5.0000 mg | Freq: Once | ORAL | Status: AC | PRN

## 2023-11-10 MED ORDER — ACETAMINOPHEN 500 MG PO TABS
ORAL_TABLET | ORAL | Status: AC
  Filled 2023-11-10: qty 2

## 2023-11-10 MED ORDER — ONDANSETRON HCL 4 MG/2ML IJ SOLN
INTRAMUSCULAR | Status: DC | PRN
  Administered 2023-11-10: 4 mg via INTRAVENOUS

## 2023-11-10 MED ORDER — BUPIVACAINE LIPOSOME 1.3 % IJ SUSP
INTRAMUSCULAR | Status: DC | PRN
  Administered 2023-11-10: 10 mL

## 2023-11-10 MED ORDER — MIDAZOLAM HCL 2 MG/2ML IJ SOLN
INTRAMUSCULAR | Status: AC
  Filled 2023-11-10: qty 2

## 2023-11-10 MED ORDER — GABAPENTIN 300 MG PO CAPS
ORAL_CAPSULE | ORAL | Status: AC
  Filled 2023-11-10: qty 1

## 2023-11-10 MED ORDER — CEFAZOLIN SODIUM-DEXTROSE 2-4 GM/100ML-% IV SOLN
INTRAVENOUS | Status: AC
  Filled 2023-11-10: qty 100

## 2023-11-10 MED ORDER — CHLORHEXIDINE GLUCONATE CLOTH 2 % EX PADS
6.0000 | MEDICATED_PAD | Freq: Once | CUTANEOUS | Status: AC
  Administered 2023-11-10: 6 via TOPICAL

## 2023-11-10 MED ORDER — ACETAMINOPHEN 500 MG PO TABS
1000.0000 mg | ORAL_TABLET | ORAL | Status: AC
Start: 2023-11-10 — End: 2023-11-10
  Administered 2023-11-10: 1000 mg via ORAL

## 2023-11-10 MED ORDER — PROPOFOL 10 MG/ML IV BOLUS
INTRAVENOUS | Status: DC | PRN
  Administered 2023-11-10: 200 mg via INTRAVENOUS

## 2023-11-10 MED ORDER — MIDAZOLAM HCL 2 MG/2ML IJ SOLN
INTRAMUSCULAR | Status: DC | PRN
  Administered 2023-11-10: 2 mg via INTRAVENOUS

## 2023-11-10 MED ORDER — FENTANYL CITRATE (PF) 100 MCG/2ML IJ SOLN
INTRAMUSCULAR | Status: DC | PRN
  Administered 2023-11-10: 50 ug via INTRAVENOUS
  Administered 2023-11-10 (×2): 25 ug via INTRAVENOUS

## 2023-11-10 MED ORDER — KETOROLAC TROMETHAMINE 30 MG/ML IJ SOLN
INTRAMUSCULAR | Status: AC
  Filled 2023-11-10: qty 1

## 2023-11-10 MED ORDER — LACTATED RINGERS IV SOLN: INTRAVENOUS | Status: DC

## 2023-11-10 MED ORDER — ORAL CARE MOUTH RINSE: 15.0000 mL | Freq: Once | OROMUCOSAL | Status: AC

## 2023-11-10 SURGICAL SUPPLY — 33 items
BLADE SURG 15 STRL LF DISP TIS (BLADE) ×2 IMPLANT
CHLORAPREP W/TINT 26 (MISCELLANEOUS) ×2 IMPLANT
DERMABOND ADVANCED .7 DNX12 (GAUZE/BANDAGES/DRESSINGS) ×2 IMPLANT
DRAIN PENROSE 12X.25 LTX STRL (MISCELLANEOUS) ×2 IMPLANT
DRAPE LAPAROTOMY 100X77 ABD (DRAPES) ×2 IMPLANT
ELECT CAUTERY BLADE TIP 2.5 (TIP) ×2
ELECT REM PT RETURN 9FT ADLT (ELECTROSURGICAL) ×2
ELECTRODE CAUTERY BLDE TIP 2.5 (TIP) ×2 IMPLANT
ELECTRODE REM PT RTRN 9FT ADLT (ELECTROSURGICAL) ×2 IMPLANT
GAUZE 4X4 16PLY ~~LOC~~+RFID DBL (SPONGE) ×2 IMPLANT
GLOVE SURG SYN 7.0 (GLOVE) ×2 IMPLANT
GLOVE SURG SYN 7.0 PF PI (GLOVE) ×2 IMPLANT
GLOVE SURG SYN 7.5 E (GLOVE) ×2 IMPLANT
GLOVE SURG SYN 7.5 PF PI (GLOVE) ×2 IMPLANT
GOWN STRL REUS W/ TWL LRG LVL3 (GOWN DISPOSABLE) ×4 IMPLANT
LABEL OR SOLS (LABEL) ×2 IMPLANT
MANIFOLD NEPTUNE II (INSTRUMENTS) ×2 IMPLANT
MESH HERNIA 1.6X1.9 PLUG LRG (Mesh General) IMPLANT
NDL HYPO 22X1.5 SAFETY MO (MISCELLANEOUS) ×2 IMPLANT
NEEDLE HYPO 22X1.5 SAFETY MO (MISCELLANEOUS) ×2 IMPLANT
NS IRRIG 500ML POUR BTL (IV SOLUTION) ×2 IMPLANT
PACK BASIN MINOR ARMC (MISCELLANEOUS) ×2 IMPLANT
SPONGE T-LAP 18X18 ~~LOC~~+RFID (SPONGE) ×2 IMPLANT
SUT MNCRL 4-0 27XMFL (SUTURE) ×2
SUT PROLENE 2 0 SH DA (SUTURE) ×4 IMPLANT
SUT SILK 2-0 30XBRD TIE 12 (SUTURE) IMPLANT
SUT VIC AB 2-0 CT1 (SUTURE) ×2 IMPLANT
SUT VIC AB 3-0 SH 27X BRD (SUTURE) ×2 IMPLANT
SUTURE MNCRL 4-0 27XMF (SUTURE) ×2 IMPLANT
SYR 10ML LL (SYRINGE) ×2 IMPLANT
SYR 30ML LL (SYRINGE) ×2 IMPLANT
TRAP FLUID SMOKE EVACUATOR (MISCELLANEOUS) ×2 IMPLANT
WATER STERILE IRR 500ML POUR (IV SOLUTION) ×2 IMPLANT

## 2023-11-10 NOTE — H&P (Signed)
 11/10/2023  Reason for Visit: Left inguinal hernia   Requesting Provider: Haze Servant, NP   History of Present Illness: Austin Rubio is a 48 y.o. male presenting for evaluation of a left inguinal hernia.  The patient has had a prior open right inguinal hernia repair with mesh in 2005 and also had a robotic assisted umbilical hernia repair with Dr. Jordis in May 2021.  At that point he was also noticed to have a left inguinal hernia which was small in size and this was not repaired at the time.  The patient reports that he had been doing well without any issues from that standpoint until more recently over the last few months that he has noticed some more burning sensation and a bulging sensation in the left groin.  Reports that overall initially that hernia was very small and has been growing in size and now he can feel a small bulge in the left groin area.  The burning sensation radiates towards the testicle.  The discomfort sometimes can last a couple of days but goes away on its own.  He is able to push the hernia contents back in.  He denies any nausea or vomiting, constipation, diarrhea, or urinary difficulties.  He has healed well from the umbilical hernia repair in the right groin and denies any issues with either area.   Past Medical History:     Past Medical History:  Diagnosis Date   Complication of anesthesia      syncope after bgetting home from surgery.    Hyperlipidemia     Pre-diabetes     Unspecified essential hypertension 05/17/2013          Past Surgical History:      Past Surgical History:  Procedure Laterality Date   HERNIA REPAIR       MELANOMA EXCISION Left 11/24/2021    Procedure: WIDE EXCISION OF LEFT CHEST SPINDLE CELL NEOPLASM;  Surgeon: Belinda Cough, MD;  Location: [facility];  Service: General;  Laterality: Left;  LMA   XI ROBOTIC ASSISTED VENTRAL HERNIA N/A 03/06/2020    Procedure: XI ROBOTIC ASSISTED VENTRAL HERNIA;  Surgeon: Jordis Laneta FALCON, MD;  Location: [facility];  Service: General;  Laterality: N/A;          Home Medications:        Prior to Admission medications   Medication Sig Start Date End Date Taking? Authorizing Provider  amLODipine  (NORVASC ) 10 MG tablet Take 1 tablet (10 mg total) by mouth daily. For blood pressure 03/07/23   Yes Fleming, Zelda W, NP  atorvastatin  (LIPITOR) 10 MG tablet Take 1 tablet (10 mg total) by mouth daily. For cholesterol 10/03/23   Yes Servant Haze ORN, NP  losartan  (COZAAR ) 50 MG tablet Take 1 tablet (50 mg total) by mouth daily. FOR BLOOD PRESSURE. 10/03/23   Yes Servant Haze ORN, NP  metFORMIN  (GLUCOPHAGE ) 500 MG tablet Take 2 tablets (1,000 mg total) by mouth daily with breakfast. For Diabetes 10/03/23   Yes Fleming, Zelda W, NP  naproxen  (NAPROSYN ) 500 MG tablet Take 1 tablet (500 mg total) by mouth 2 (two) times daily with a meal. For back pain 03/07/23   Yes Fleming, Zelda W, NP  triamcinolone  (KENALOG ) 0.025 % ointment Apply to skin rash 2 (two) times daily As needed. 06/04/22   Yes Servant Haze ORN, NP      Allergies: Allergies  No Known Allergies     Social History:  reports that he has never  smoked. He has never used smokeless tobacco. He reports that he does not drink alcohol and does not use drugs.    Family History:      Family History  Problem Relation Age of Onset   Diabetes Mother     Heart disease Father            Review of Systems: Review of Systems  Constitutional:  Negative for chills and fever.  HENT:  Negative for hearing loss.   Respiratory:  Negative for shortness of breath.   Cardiovascular:  Negative for chest pain.  Gastrointestinal:  Positive for abdominal pain (left groin burning sensation). Negative for constipation, diarrhea, nausea and vomiting.  Genitourinary:  Negative for dysuria.  Musculoskeletal:  Negative for myalgias.  Skin:  Negative for rash.  Neurological:  Negative for dizziness.  Psychiatric/Behavioral:  Negative for depression.        Physical Exam BP 125/78   Pulse 75   Temp 98 F (36.7 C) (Oral)   Wt 206 lb 9.6 oz (93.7 kg)   SpO2 97%   BMI 34.38 kg/m  CONSTITUTIONAL: No acute distress HEENT:  Normocephalic, atraumatic, extraocular motion intact. NECK: Trachea is midline, and there is no jugular venous distension.  RESPIRATORY:  Lungs are clear, and breath sounds are equal bilaterally. Normal respiratory effort without pathologic use of accessory muscles. CARDIOVASCULAR: Heart is regular without murmurs, gallops, or rubs. GI: The abdomen is soft, nondistended, with some discomfort to palpation in the left groin.  The patient has prior right inguinal hernia and left-sided abdominal incisions from umbilical hernia repair which are all well-healed without any evidence of hernia recurrence in either the right groin or the umbilicus.  In the left groin, the patient does have what appears to be a direct inguinal hernia which is limited to the groin itself without any extension to the scrotum.  There are some discomfort with coughing and when reducing the hernia but is otherwise easily reducible.  MUSCULOSKELETAL:  Normal muscle strength and tone in all four extremities.  No peripheral edema or cyanosis. SKIN: Skin turgor is normal. There are no pathologic skin lesions.  NEUROLOGIC:  Motor and sensation is grossly normal.  Cranial nerves are grossly intact. PSYCH:  Alert and oriented to person, place and time. Affect is normal.   Laboratory Analysis: Labs from 10/03/2023: Sodium 141, potassium 4.5, chloride 103, CO2 25, BUN 13, creatinine 0.63.  Total bilirubin 0.3, AST 32, ALT 42, alkaline phosphatase 100, albumin 4.2.  Hemoglobin A1c 6.0   Imaging: No results found.   Assessment and Plan: This is a 48 y.o. male with a left inguinal hernia.   - Discussed with patient the findings on exam today which confirm a left-sided inguinal hernia.  Based on location, it could possibly be a direct inguinal hernia.  Discussed  with the patient that the burning sensation that he is feeling may be related to the nerves in the inguinal region that are getting aggravated by the bulging sensation.  The hernia is reducible and although there is some mild discomfort there is no evidence of incarceration or strangulation.  Discussed with patient the natural progression for hernias would be to get bigger in size and started with symptoms which is what has been happening with the patient.  As such I would recommend surgical repair. - Discussed with the patient the potential options for repair which would include a robotic assisted left inguinal hernia repair which would mean making incisions in the abdomen, particularly at the  umbilical area which would traverse the previously placed mesh versus proceeding with surgery in the more traditional fashion with the incision of the left groin instead.  After further discussion, the patient prefers to proceed with an open inguinal hernia repair. - Discussed with him then the plan for open left inguinal hernia repair with mesh and reviewed the procedure at length with him including the planned incision, risks of bleeding, infection, injury to surrounding structures, the use of mesh, postoperative activity restrictions, pain control, that this would be an outpatient procedure, and he is willing to proceed. - We will schedule him for surgery on 11/10/2023 at the patient's request.  All his questions have been answered.     Aloysius Sheree Plant, MD [facility]

## 2023-11-10 NOTE — Transfer of Care (Signed)
 Immediate Anesthesia Transfer of Care Note  Patient: Austin Rubio  Procedure(s) Performed: HERNIA REPAIR INGUINAL ADULT, open (Left)  Patient Location: PACU  Anesthesia Type:General  Level of Consciousness: awake, alert , oriented, and patient cooperative  Airway & Oxygen Therapy: Patient Spontanous Breathing and Patient connected to face mask oxygen  Post-op Assessment: Report given to RN and Post -op Vital signs reviewed and stable  Post vital signs: Reviewed and stable  Last Vitals:  Vitals Value Taken Time  BP 149/83 11/10/23 1136  Temp 97.4   Pulse 80 11/10/23 1138  Resp 15 11/10/23 1138  SpO2 98 % 11/10/23 1138  Vitals shown include unfiled device data.  Last Pain:  Vitals:   11/10/23 0933  TempSrc: Temporal  PainSc: 3          Complications: No notable events documented.

## 2023-11-10 NOTE — Anesthesia Preprocedure Evaluation (Signed)
 Anesthesia Evaluation  Patient identified by MRN, date of birth, ID band Patient awake    Reviewed: Allergy & Precautions, H&P , NPO status , Patient's Chart, lab work & pertinent test results  History of Anesthesia Complications (+) history of anesthetic complications (reports syncopal event at home after anesthesia)  Airway Mallampati: II  TM Distance: >3 FB Neck ROM: full    Dental  (+) Teeth Intact   Pulmonary neg pulmonary ROS, neg COPD, Not current smoker, former smoker   Pulmonary exam normal breath sounds clear to auscultation       Cardiovascular hypertension, Pt. on medications (-) angina (-) Past MI negative cardio ROS Normal cardiovascular exam(-) dysrhythmias  Rhythm:regular Rate:Normal     Neuro/Psych negative neurological ROS  negative psych ROS   GI/Hepatic negative GI ROS, Neg liver ROS,,,  Endo/Other  negative endocrine ROSdiabetes, Type 2, Oral Hypoglycemic Agents    Renal/GU      Musculoskeletal   Abdominal   Peds  Hematology negative hematology ROS (+)   Anesthesia Other Findings Patient stated that last time he got anesthesia he felt very dizzy after surgery. When he went to the elevator, he states he almost fell down. States that he went to his cardiologist and was checked out but they never found anything. Patient states he feels well today. Denies feeling short of breath, heart palpitations, dizzy. Discussed with him the risks of anesthesia including heart attack, stroke, and even death. Patient states he is aware and agrees to proceed.   Past Medical History: No date: Complication of anesthesia     Comment:  syncope after getting home from surgery. 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  Past Surgical  History: 2005: INGUINAL HERNIA REPAIR; Right 11/24/2021: MELANOMA EXCISION; Left     Comment:  Procedure: WIDE EXCISION OF LEFT CHEST SPINDLE CELL               NEOPLASM;  Surgeon: Belinda Cough, MD;  Location: [facility]               [place];  Service: General;  Laterality:               Left;  LMA 03/06/2020: XI ROBOTIC ASSISTED VENTRAL HERNIA; N/A     Comment:  Procedure: XI ROBOTIC ASSISTED VENTRAL HERNIA;  Surgeon:              Jordis Laneta FALCON, MD;  Location: [facility];  Service:               General;  Laterality: N/A;     Reproductive/Obstetrics negative OB ROS                             Anesthesia Physical Anesthesia Plan  ASA: 2  Anesthesia Plan: General ETT and General   Post-op Pain Management:    Induction: Intravenous  PONV Risk Score and Plan: 2 and Ondansetron , Dexamethasone  and Midazolam   Airway Management Planned: Oral ETT  Additional Equipment:   Intra-op Plan:   Post-operative Plan: Extubation in OR  Informed Consent: I have reviewed the patients History and Physical, chart, labs and discussed the procedure including the risks, benefits and alternatives for the proposed anesthesia with the patient or authorized representative who has indicated his/her understanding and acceptance.  Dental Advisory Given  Plan Discussed with: Anesthesiologist, CRNA and Surgeon  Anesthesia Plan Comments: (Patient consented for risks of anesthesia including but not limited to:  - adverse reactions to medications - damage to eyes, teeth, lips or other oral mucosa - nerve damage due to positioning  - sore throat or hoarseness - Damage to heart, brain, nerves, lungs, other parts of body or loss of life  Patient voiced understanding and assent.)       Anesthesia Quick Evaluation

## 2023-11-10 NOTE — Anesthesia Postprocedure Evaluation (Signed)
 Anesthesia Post Note  Patient: Austin Rubio  Procedure(s) Performed: HERNIA REPAIR INGUINAL ADULT, open (Left) INSERTION OF MESH  Patient location during evaluation: PACU Anesthesia Type: General Level of consciousness: awake and alert Pain management: pain level controlled Vital Signs Assessment: post-procedure vital signs reviewed and stable Respiratory status: spontaneous breathing, nonlabored ventilation, respiratory function stable and patient connected to nasal cannula oxygen Cardiovascular status: blood pressure returned to baseline and stable Postop Assessment: no apparent nausea or vomiting Anesthetic complications: no  No notable events documented.   Last Vitals:  Vitals:   11/10/23 1200 11/10/23 1203  BP: 136/84   Pulse: 79 76  Resp: 15 14  Temp:    SpO2: 93% 93%    Last Pain:  Vitals:   11/10/23 1203  TempSrc:   PainSc: 4                  Debby Mines

## 2023-11-10 NOTE — Discharge Instructions (Addendum)
 Discharge Instructions: 1.  Patient may shower, but do not scrub wounds heavily and dab dry only. 2.  Do not submerge wounds in pool/tub until fully healed. 3.  Do not apply ointments or hydrogen peroxide to the wounds. 4.  May apply ice packs to the wounds for comfort. 5.  Do not drive while taking narcotics for pain control.  Prior to driving, make sure you are able to rotate right and left to look at blindspots without significant pain or discomfort. 6.  No heavy lifting or pushing of more than 10-15 lbs for 6 weeks.  Information for Discharge Teaching: EXPAREL  (bupivacaine  liposome injectable suspension)   Pain relief is important to your recovery. The goal is to control your pain so you can move easier and return to your normal activities as soon as possible after your procedure. Your physician may use several types of medicines to manage pain, swelling, and more.  Your surgeon or anesthesiologist gave you EXPAREL (bupivacaine ) to help control your pain after surgery.  EXPAREL  is a local anesthetic designed to release slowly over an extended period of time to provide pain relief by numbing the tissue around the surgical site. EXPAREL  is designed to release pain medication over time and can control pain for up to 72 hours. Depending on how you respond to EXPAREL , you may require less pain medication during your recovery. EXPAREL  can help reduce or eliminate the need for opioids during the first few days after surgery when pain relief is needed the most. EXPAREL  is not an opioid and is not addictive. It does not cause sleepiness or sedation.   Important! A teal colored band has been placed on your arm with the date, time and amount of EXPAREL  you have received. Please leave this armband in place for the full 96 hours following administration, and then you may remove the band. If you return to the hospital for any reason within 96 hours following the administration of EXPAREL , the armband  provides important information that your health care providers to know, and alerts them that you have received this anesthetic.    Possible side effects of EXPAREL : Temporary loss of sensation or ability to move in the area where medication was injected. Nausea, vomiting, constipation Rarely, numbness and tingling in your mouth or lips, lightheadedness, or anxiety may occur. Call your doctor right away if you think you may be experiencing any of these sensations, or if you have other questions regarding possible side effects.  Follow all other discharge instructions given to you by your surgeon or nurse. Eat a healthy diet and drink plenty of water or other fluids.

## 2023-11-10 NOTE — Anesthesia Procedure Notes (Signed)
 Procedure Name: Intubation Date/Time: 11/10/2023 9:57 AM  Performed by: Nada Corean CROME, CRNAPre-anesthesia Checklist: Patient identified, Emergency Drugs available, Suction available, Patient being monitored and Timeout performed Patient Re-evaluated:Patient Re-evaluated prior to induction Oxygen Delivery Method: Circle system utilized Preoxygenation: Pre-oxygenation with 100% oxygen Induction Type: IV induction Ventilation: Mask ventilation without difficulty Laryngoscope Size: McGrath and 4 Grade View: Grade I Tube type: Oral Tube size: 7.5 mm Number of attempts: 1 Airway Equipment and Method: Stylet and Video-laryngoscopy Placement Confirmation: ETT inserted through vocal cords under direct vision, positive ETCO2 and breath sounds checked- equal and bilateral Secured at: 21 cm Tube secured with: Tape Dental Injury: Teeth and Oropharynx as per pre-operative assessment

## 2023-11-10 NOTE — Op Note (Signed)
  Procedure Date:  11/10/2023  Pre-operative Diagnosis:  Left inguinal hernia  Post-operative Diagnosis: Left inguinal hernia  Procedure:  Left Inguinal Hernia Repair with mesh  Surgeon:  Aloysius Sheree Plant, MD  Anesthesia:  General endotracheal  Estimated Blood Loss:  10 ml  Specimens:  Cord lipoma  Complications:  None  Findings:  The patient had a direct inguinal hernia and a cord lipoma.  Indications for Procedure:  This is a 48 y.o. male who presents with a left inguinal hernia.  The options of surgery versus observation were reviewed with the patient and/or family. The risks of bleeding, abscess or infection, recurrence of symptoms, potential for an open procedure, injury to surrounding structures, and chronic pain were all discussed with the patient and was willing to proceed.  Description of Procedure: The patient was correctly identified in the preoperative area and brought into the operating room.  The patient was placed supine with VTE prophylaxis in place.  Appropriate time-outs were performed.  Anesthesia was induced and the patient was intubated.  Appropriate antibiotics were infused.  The left groin and lower abdomen were prepped and draped in a sterile fashion. An oblique incision was made between the pubic symphysis extending laterally toward the ASIS. Using electrocautery, the subcutaneous tissues were dissected, assuring adequate hemostasis, until reaching the external oblique aponeurosis.  A 1 cm incision was made over the aponeurosis and extended laterally and medially toward the external inguinal ring avoiding injury to the ilioinguinal nerve.  The aponeurosis was very thin and stretched.  The cord was encircled using a Penrose drain and using medial and lateral retraction, the cord structures were identified.  The patient had a direct inguinal hernia.  The hernia sac was identified and dissected free.  The sac was pushed back into the abdominal cavity.  The patient also  had a cord lipoma which was dissected free off the cord, ligated at its base, and then resected.    A large Bard mesh and plug was opened.  The plug was placed in the direct space and sutured in place in two areas using 2-0 Prolene sutures.  Then, the mesh was placed and sutured to the pubic tubercle, shelving edge, and conjoined tendon with interrupted 2-0 Prolene sutures.  The tails of the mesh were crossed behind the cord and sutured together creating a new internal ring.  The external oblique was then closed in running fashion with 2-0 Vicryl, creating a new external ring.  The wound was irrigated and the incision was closed in three layers with 2-0 Vicryl, 3-0 Vicryl and 4-0 Monocryl.  The wound was cleaned and sealed with DermaBond.  The patient was emerged from anesthesia and extubated and brought to the recovery room for further management.  The patient tolerated the procedure well and all counts were correct at the end of the case.   Aloysius Sheree Plant, MD

## 2023-11-11 LAB — SURGICAL PATHOLOGY

## 2023-11-12 ENCOUNTER — Encounter: Payer: Self-pay | Admitting: Surgery

## 2023-11-17 ENCOUNTER — Other Ambulatory Visit: Payer: Self-pay

## 2023-11-17 ENCOUNTER — Other Ambulatory Visit: Payer: Self-pay | Admitting: Nurse Practitioner

## 2023-11-17 DIAGNOSIS — I10 Essential (primary) hypertension: Secondary | ICD-10-CM

## 2023-11-17 MED ORDER — AMLODIPINE BESYLATE 10 MG PO TABS
10.0000 mg | ORAL_TABLET | Freq: Every day | ORAL | 0 refills | Status: DC
  Filled 2023-11-17: qty 90, 90d supply, fill #0

## 2023-11-23 ENCOUNTER — Ambulatory Visit (INDEPENDENT_AMBULATORY_CARE_PROVIDER_SITE_OTHER): Payer: Self-pay | Admitting: Surgery

## 2023-11-23 ENCOUNTER — Encounter: Payer: Self-pay | Admitting: Surgery

## 2023-11-23 VITALS — BP 140/80 | HR 68 | Temp 98.0°F | Ht 65.0 in | Wt 204.0 lb

## 2023-11-23 DIAGNOSIS — K4090 Unilateral inguinal hernia, without obstruction or gangrene, not specified as recurrent: Secondary | ICD-10-CM

## 2023-11-23 DIAGNOSIS — Z09 Encounter for follow-up examination after completed treatment for conditions other than malignant neoplasm: Secondary | ICD-10-CM

## 2023-11-23 NOTE — Patient Instructions (Addendum)
POSTOPERATORIO GENERAL INSTRUCCIONES PARA EL PACIENTE   INSTRUCCIONES PARA EL CUIDADO DE LAS HERIDAS: Trate de mantener la herida seca y evite aplicar ungentos a menos que se lo indiquen.  Si la herida se vuelve de color rojo brillante y dolorosa o comienza a Surveyor, quantity infectado que no est claro, comunquese con su mdico de inmediato.  Si la herida es ligeramente rosada y tiene una cresta gruesa y [place], esto es normal y se conoce como cresta de curacin.  Esto se resolver en las prximas 4 a 6 semanas. BAOS: Puede ducharse si su cirujano se lo ha informado. Sin embargo, no se sumerja en Human resources officer, jacuzzi o piscina [employer] las incisiones estn completamente selladas o su cirujano le haya indicado que puede [place]. DIETA: Puede comer cualquier alimento que pueda tolerar.  Es Neomia Dear buena idea llevar una dieta rica en fibra y beber muchos lquidos para prevenir el estreimiento.  Si sufre estreimiento, es posible que desee tomar un laxante suave o tabletas de ducolax diariamente hasta que sus hbitos intestinales sean regulares.  El estreimiento puede ser muy incmodo, junto con el esfuerzo, despus de Neomia Dear ciruga reciente. ACTIVIDAD: No debe levantar ms de 20 libras durante un total de 6 semanas despus de la [place], ya que podra aumentar el riesgo de sufrir complicaciones.  Veinte libras equivalen aproximadamente a una bolsa de plstico de comestibles. En ese momento, escuche a su cuerpo al levantar, si tiene dolor al levantar, detngase y vuelva a intentarlo en [employer]. El dolor despus de hacer ejercicios o actividades de la vida diaria es normal a medida que regresa a su rutina normal. MEDICAMENTOS: Si desarrolla nuseas y vmitos debido al analgsico, o desarrolla un sarpullido, suspenda el medicamento y comunquese con su mdico.  No debe conducir, tomar decisiones importantes ni operar maquinaria mientras toma analgsicos narcticos. BLOQUEADOR SOLAR Utilice bloqueador solar  en el rea de la incisin durante el prximo ao si esta rea estar expuesta al sol. Esto ayuda a disminuir las cicatrices y Occupational hygienist un rea oscura permanente sobre la incisin.  PREGUNTAS: No dude en llamar a nuestra oficina si tiene alguna pregunta y estaremos encantados de ayudarle. [phone]

## 2023-11-23 NOTE — Progress Notes (Signed)
11/23/2023  HPI: Austin Rubio is a 48 y.o. male s/p open left inguinal hernia repair on 11/10/2023.  Patient seen today for follow-up.  He reports that he has been doing well with some soreness in the left groin and swelling but denies any worsening pain or bulging sensation.  Vital signs: BP (!) 140/80   Pulse 68   Temp 98 F (36.7 C)   Ht 5\' 5"  (1.651 m)   Wt 204 lb (92.5 kg)   SpO2 97%   BMI 33.95 kg/m    Physical Exam: Constitutional: No acute distress Abdomen: Soft, nondistended, appropriately sore to palpation.  Patient's left inguinal incision is healing well and is clean, dry, intact.  There is palpable firmness and swelling associated with postoperative changes but otherwise no evidence of hernia recurrence or seroma.  Assessment/Plan: This is a 48 y.o. male s/p open left inguinal hernia repair P  - Discussed with patient and the findings on exam are expected after the surgery.  The firmness and swelling are related to postoperative changes and these should be improving with time. - Discussed with the patient activity restrictions. - Follow-up as needed.   Howie Ill, MD [facility]

## 2024-01-02 ENCOUNTER — Other Ambulatory Visit: Payer: Self-pay | Admitting: Pharmacist

## 2024-01-02 ENCOUNTER — Other Ambulatory Visit: Payer: Self-pay

## 2024-01-02 ENCOUNTER — Ambulatory Visit: Payer: Self-pay | Attending: Nurse Practitioner | Admitting: Nurse Practitioner

## 2024-01-02 ENCOUNTER — Encounter: Payer: Self-pay | Admitting: Nurse Practitioner

## 2024-01-02 VITALS — BP 145/83 | HR 85 | Wt 212.4 lb

## 2024-01-02 DIAGNOSIS — I10 Essential (primary) hypertension: Secondary | ICD-10-CM

## 2024-01-02 DIAGNOSIS — D171 Benign lipomatous neoplasm of skin and subcutaneous tissue of trunk: Secondary | ICD-10-CM

## 2024-01-02 DIAGNOSIS — J3089 Other allergic rhinitis: Secondary | ICD-10-CM

## 2024-01-02 MED ORDER — LOSARTAN POTASSIUM-HCTZ 50-12.5 MG PO TABS
1.0000 | ORAL_TABLET | Freq: Every day | ORAL | 1 refills | Status: DC
Start: 2024-01-02 — End: 2024-04-03
  Filled 2024-01-02: qty 90, 90d supply, fill #0

## 2024-01-02 MED ORDER — FLUTICASONE PROPIONATE 50 MCG/ACT NA SUSP
2.0000 | Freq: Every day | NASAL | 6 refills | Status: AC
  Filled 2024-01-02: qty 16, 30d supply, fill #0

## 2024-01-02 MED ORDER — OLOPATADINE HCL 0.2 % OP SOLN
OPHTHALMIC | 1 refills | Status: DC
  Filled 2024-01-02: qty 2.5, fill #0

## 2024-01-02 MED ORDER — LORATADINE 10 MG PO TABS
10.0000 mg | ORAL_TABLET | Freq: Every day | ORAL | 11 refills | Status: DC
  Filled 2024-01-02: qty 30, 30d supply, fill #0

## 2024-01-02 MED ORDER — OLOPATADINE HCL 0.2 % OP SOLN
OPHTHALMIC | 1 refills | Status: DC
  Filled 2024-01-02: qty 2.5, 25d supply, fill #0

## 2024-01-02 NOTE — Progress Notes (Signed)
 Assessment & Plan:  Austin Rubio was seen today for medical management of chronic issues.  Diagnoses and all orders for this visit:  Primary hypertension Dose change: Losartan switch to Hyzaar.  Continue amlodipine as prescribed and follow-up in 6 to 8 weeks for blood pressure recheck -     losartan-hydrochlorothiazide (HYZAAR) 50-12.5 MG tablet; Take 1 tablet by mouth daily.  Lipoma of torso -     Ambulatory referral to Dermatology  Environmental and seasonal allergies -     fluticasone (FLONASE) 50 MCG/ACT nasal spray; Place 2 sprays into both nostrils daily. -     loratadine (CLARITIN) 10 MG tablet; Take 1 tablet (10 mg total) by mouth daily. -     Olopatadine HCl 0.2 % SOLN; Apply to both eyes twice per day    Patient has been counseled on age-appropriate routine health concerns for screening and prevention. These are reviewed and up-to-date. Referrals have been placed accordingly. Immunizations are up-to-date or declined.    Subjective:   Chief Complaint  Patient presents with   Medical Management of Chronic Issues    Patient stated that he has been having bad allergies and eyelids have been irritated     Austin Rubio 48 y.o. male presents to office today for follow up to HTN and with complaints of allergies.   VRI was used to communicate directly with patient for the entire encounter including providing detailed patient instructions.    He has a PMH of prediabetes, HTN, Umbilical hernia   Blood pressure is significantly elevated today despite his endorsement with taking amlodipine 10 mg daily and losartan 50 mg daily. BP Readings from Last 3 Encounters:  01/02/24 (!) 145/83  11/23/23 (!) 140/80  11/10/23 (!) 145/95     He has a 3 cm lipoma adjacent to the left clavicle which she would like removed.  Onset was a few years ago.  It has not increased in size over these past 2 years.    He has a stye on the right upper eyelid today as well as allergic conjunctivitis,  rhinorrhea and nasal congestion and dry cough.  States he gets these symptoms every year around this time and believes i his allergies are triggered.   Review of Systems  Constitutional:  Negative for fever, malaise/fatigue and weight loss.  HENT:  Positive for congestion. Negative for nosebleeds.   Eyes:  Positive for pain and redness. Negative for blurred vision, double vision, photophobia and discharge.  Respiratory:  Positive for cough. Negative for shortness of breath.   Cardiovascular: Negative.  Negative for chest pain, palpitations and leg swelling.  Gastrointestinal: Negative.  Negative for heartburn, nausea and vomiting.  Musculoskeletal: Negative.  Negative for myalgias.  Skin:        See HPI  Neurological: Negative.  Negative for dizziness, focal weakness, seizures and headaches.  Psychiatric/Behavioral: Negative.  Negative for suicidal ideas.     Past Medical History:  Diagnosis Date   Complication of anesthesia    syncope after getting home from surgery.   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    Past Surgical History:  Procedure Laterality Date   INGUINAL HERNIA REPAIR Right 2005   INGUINAL HERNIA REPAIR Left 11/10/2023   Procedure: HERNIA REPAIR INGUINAL ADULT, open;  Surgeon: Henrene Dodge, MD;  Location: [facility];  Service: General;  Laterality: Left;   INSERTION OF MESH  11/10/2023   Procedure: INSERTION OF MESH;  Surgeon: Henrene Dodge, MD;  Location: [facility];  Service: General;;   MELANOMA EXCISION Left 11/24/2021   Procedure: WIDE EXCISION OF LEFT CHEST SPINDLE CELL NEOPLASM;  Surgeon: Manus Rudd, MD;  Location: [facility];  Service: General;  Laterality: Left;  LMA   XI ROBOTIC ASSISTED VENTRAL HERNIA N/A 03/06/2020   Procedure: XI ROBOTIC ASSISTED VENTRAL HERNIA;  Surgeon: Leafy Ro, MD;  Location: [facility];  Service: General;  Laterality: N/A;    Family History  Problem Relation Age of Onset   Diabetes Mother    Heart disease Father     Social History Reviewed with no changes to be made today.   Outpatient Medications Prior to Visit  Medication Sig Dispense Refill   acetaminophen (TYLENOL) 500 MG tablet Take 2 tablets (1,000 mg total) by mouth every 6 (six) hours as needed for mild pain (pain score 1-3).     amLODipine (NORVASC) 10 MG tablet Take 1 tablet (10 mg total) by mouth daily. For blood pressure 90 tablet 0   atorvastatin (LIPITOR) 10 MG tablet Take 1 tablet (10 mg total) by mouth daily. For cholesterol (Patient taking differently: Take 10 mg by mouth daily with lunch. For cholesterol) 90 tablet 1   metFORMIN (GLUCOPHAGE) 500 MG tablet Take 2 tablets (1,000 mg total) by mouth daily with breakfast. For Diabetes (Patient taking differently: Take 1,000 mg by mouth daily with lunch. For Diabetes) 180 tablet 1   naproxen (NAPROSYN) 500 MG tablet Take 1 tablet (500 mg total) by mouth 2 (two) times daily as needed for moderate pain (pain score 4-6). For back pain 40 tablet 1   oxyCODONE (OXY IR/ROXICODONE) 5 MG immediate release tablet Take 1 tablet (5 mg total) by mouth every 4 (four) hours as needed for severe pain (pain score 7-10). 30 tablet 0   losartan (COZAAR) 50 MG tablet Take 1 tablet (50 mg total) by mouth daily. FOR BLOOD PRESSURE. 90 tablet 1   No facility-administered medications prior to visit.    No Known Allergies     Objective:    BP (!) 145/83   Pulse 85   Wt 212 lb 6.4 oz (96.3 kg)   SpO2 95%   BMI 35.35 kg/m  Wt Readings from Last 3 Encounters:  01/02/24 212 lb 6.4 oz (96.3 kg)  11/23/23 204 lb (92.5 kg)  11/10/23 206 lb (93.4 kg)    Physical Exam Vitals and nursing note reviewed.  Constitutional:      Appearance: He is well-developed.  HENT:     Head: Normocephalic and atraumatic.  Eyes:     Conjunctiva/sclera:     Right  eye: Right conjunctiva is injected.     Left eye: Left conjunctiva is injected.   Cardiovascular:     Rate and Rhythm: Normal rate and regular rhythm.     Heart sounds: Normal heart sounds. No murmur heard.    No friction rub. No gallop.  Pulmonary:     Effort: Pulmonary effort is normal. No tachypnea or respiratory distress.     Breath sounds: Normal breath sounds. No decreased breath sounds, wheezing, rhonchi or rales.  Chest:     Chest wall: No tenderness.  Abdominal:     General: Bowel sounds are normal.     Palpations: Abdomen is soft.  Musculoskeletal:        General: Normal range of motion.  Left shoulder: Deformity (lipoma) present.       Arms:     Cervical back: Normal range of motion.  Skin:    General: Skin is warm and dry.  Neurological:     Mental Status: He is alert and oriented to person, place, and time.     Coordination: Coordination normal.  Psychiatric:        Behavior: Behavior normal. Behavior is cooperative.        Thought Content: Thought content normal.        Judgment: Judgment normal.          Patient has been counseled extensively about nutrition and exercise as well as the importance of adherence with medications and regular follow-up. The patient was given clear instructions to go to ER or return to medical center if symptoms don't improve, worsen or new problems develop. The patient verbalized understanding.   Follow-up: Return in about 8 weeks (around 02/27/2024) for BP recheck.   Claiborne Rigg, FNP-BC [facility] [place], [place] [phone]   01/02/2024, 4:46 PM

## 2024-01-03 ENCOUNTER — Other Ambulatory Visit: Payer: Self-pay

## 2024-01-12 ENCOUNTER — Other Ambulatory Visit: Payer: Self-pay

## 2024-02-13 ENCOUNTER — Other Ambulatory Visit: Payer: Self-pay

## 2024-02-16 ENCOUNTER — Other Ambulatory Visit: Payer: Self-pay

## 2024-04-03 ENCOUNTER — Encounter: Payer: Self-pay | Admitting: Nurse Practitioner

## 2024-04-03 ENCOUNTER — Ambulatory Visit: Payer: Self-pay | Attending: Nurse Practitioner | Admitting: Nurse Practitioner

## 2024-04-03 ENCOUNTER — Other Ambulatory Visit: Payer: Self-pay

## 2024-04-03 VITALS — BP 126/72 | HR 70 | Resp 20 | Ht 65.0 in | Wt 209.6 lb

## 2024-04-03 DIAGNOSIS — D171 Benign lipomatous neoplasm of skin and subcutaneous tissue of trunk: Secondary | ICD-10-CM

## 2024-04-03 DIAGNOSIS — R7303 Prediabetes: Secondary | ICD-10-CM

## 2024-04-03 DIAGNOSIS — J3089 Other allergic rhinitis: Secondary | ICD-10-CM

## 2024-04-03 DIAGNOSIS — E781 Pure hyperglyceridemia: Secondary | ICD-10-CM

## 2024-04-03 DIAGNOSIS — I10 Essential (primary) hypertension: Secondary | ICD-10-CM

## 2024-04-03 LAB — POCT GLYCOSYLATED HEMOGLOBIN (HGB A1C): HbA1c, POC (prediabetic range): 5.7 % (ref 5.7–6.4)

## 2024-04-03 MED ORDER — ATORVASTATIN CALCIUM 10 MG PO TABS
10.0000 mg | ORAL_TABLET | Freq: Every day | ORAL | 1 refills | Status: AC
Start: 2024-04-03 — End: ?
  Filled 2024-04-03: qty 90, 90d supply, fill #0
  Filled 2024-06-27: qty 30, 30d supply, fill #0
  Filled 2024-07-25: qty 30, 30d supply, fill #1
  Filled 2024-10-01 (×2): qty 30, 30d supply, fill #2
  Filled 2024-11-06: qty 30, 30d supply, fill #3

## 2024-04-03 MED ORDER — LORATADINE 10 MG PO TABS
10.0000 mg | ORAL_TABLET | Freq: Every day | ORAL | 3 refills | Status: DC
  Filled 2024-04-03: qty 90, 90d supply, fill #0

## 2024-04-03 MED ORDER — METFORMIN HCL 500 MG PO TABS
1000.0000 mg | ORAL_TABLET | Freq: Every day | ORAL | 1 refills | Status: DC
Start: 2024-04-03 — End: 2024-08-01
  Filled 2024-04-03 – 2024-06-06 (×2): qty 180, 90d supply, fill #0

## 2024-04-03 MED ORDER — LOSARTAN POTASSIUM-HCTZ 50-12.5 MG PO TABS
1.0000 | ORAL_TABLET | Freq: Every day | ORAL | 1 refills | Status: DC
  Filled 2024-04-03 – 2024-05-01 (×2): qty 90, 90d supply, fill #0
  Filled 2024-07-25: qty 90, 90d supply, fill #1

## 2024-04-03 NOTE — Patient Instructions (Signed)
 [facility]  [address], [place], [place] [address] Ph# [phone] Fax [phone]

## 2024-04-03 NOTE — Progress Notes (Signed)
 Assessment & Plan:  [name] was seen today for hypertension.  Diagnoses and all orders for this visit:  Primary hypertension -     losartan -hydrochlorothiazide  (HYZAAR ) 50-12.5 MG tablet; Take 1 tablet by mouth daily. FOR BLOOD PRESSURE. Continue all antihypertensives as prescribed.  Reminded to bring in blood pressure log for follow  up appointment.  RECOMMENDATIONS: DASH/Mediterranean Diets are healthier choices for HTN.    Lipoma of torso -     Cancel: Ambulatory referral to Dermatology -     Ambulatory referral to Dermatology  Prediabetes -     POCT glycosylated hemoglobin (Hb A1C) -     metFORMIN  (GLUCOPHAGE ) 500 MG tablet; Take 2 tablets (1,000 mg total) by mouth daily with lunch. For Diabetes.  Hypertriglyceridemia -     atorvastatin  (LIPITOR) 10 MG tablet; Take 1 tablet (10 mg total) by mouth daily with lunch. For cholesterol. INSTRUCTIONS: Work on a low fat, heart healthy diet and participate in regular aerobic exercise program by working out at least 150 minutes per week; 5 days a week-30 minutes per day. Avoid red meat/beef/steak,  fried foods. junk foods, sodas, sugary drinks, unhealthy snacking, alcohol and smoking.  Drink at least 80 oz of water per day and monitor your carbohydrate intake daily.    Environmental and seasonal allergies -     loratadine  (CLARITIN ) 10 MG tablet; Take 1 tablet (10 mg total) by mouth daily.    Patient has been counseled on age-appropriate routine health concerns for screening and prevention. These are reviewed and up-to-date. Referrals have been placed accordingly. Immunizations are up-to-date or declined.    Subjective:   Chief Complaint  Patient presents with   Hypertension    Austin Rubio 48 y.o. male presents to office today for follow up to HTN and DM  VRI was used to communicate directly with patient for the entire encounter including providing detailed patient instructions.     He has a PMH of prediabetes, HTN, Umbilical  hernia   He is not taking amlodipine . He thought he was supposed to stop amlodipine  and start losartan  hydrochlorothiazide  50-12.5 instead of taking both. As blood pressure is well controlled we will continue with hyzaar  only at this time.  BP Readings from Last 3 Encounters:  04/03/24 126/72  01/02/24 (!) 145/83  11/23/23 (!) 140/80     Prediabetes A1c at goal of less than 6.5 with metformin  1000 mg daily. Lab Results  Component Value Date   HGBA1C 5.7 04/03/2024    He has a 3 cm lipoma adjacent to the left clavicle which she would like removed. Onset was a few years ago. It has not increased in size over these past 2 years. He was referred to dermatology at his last appt with me however they were unable to reach him to schedule. Will place another referral and also give him the office number to schedule.    Review of Systems  Constitutional:  Negative for fever, malaise/fatigue and weight loss.  HENT: Negative.  Negative for nosebleeds.   Eyes: Negative.  Negative for blurred vision, double vision and photophobia.  Respiratory: Negative.  Negative for cough and shortness of breath.   Cardiovascular: Negative.  Negative for chest pain, palpitations and leg swelling.  Gastrointestinal: Negative.  Negative for heartburn, nausea and vomiting.  Musculoskeletal: Negative.  Negative for myalgias.  Skin:        SEE HPI  Neurological: Negative.  Negative for dizziness, focal weakness, seizures and headaches.  Psychiatric/Behavioral: Negative.  Negative  for suicidal ideas.     Past Medical History:  Diagnosis Date   Complication of anesthesia    syncope after getting home from surgery.   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    Past Surgical History:  Procedure Laterality Date   INGUINAL HERNIA REPAIR Right 2005   INGUINAL  HERNIA REPAIR Left 11/10/2023   Procedure: HERNIA REPAIR INGUINAL ADULT, open;  Surgeon: Emmalene Hare, MD;  Location: [facility];  Service: General;  Laterality: Left;   INSERTION OF MESH  11/10/2023   Procedure: INSERTION OF MESH;  Surgeon: Emmalene Hare, MD;  Location: [facility];  Service: General;;   MELANOMA EXCISION Left 11/24/2021   Procedure: WIDE EXCISION OF LEFT CHEST SPINDLE CELL NEOPLASM;  Surgeon: Dareen Ebbing, MD;  Location: [facility];  Service: General;  Laterality: Left;  LMA   XI ROBOTIC ASSISTED VENTRAL HERNIA N/A 03/06/2020   Procedure: XI ROBOTIC ASSISTED VENTRAL HERNIA;  Surgeon: Alben Alma, MD;  Location: [facility];  Service: General;  Laterality: N/A;    Family History  Problem Relation Age of Onset   Diabetes Mother    Heart disease Father     Social History Reviewed with no changes to be made today.   Outpatient Medications Prior to Visit  Medication Sig Dispense Refill   fluticasone  (FLONASE ) 50 MCG/ACT nasal spray Place 2 sprays into both nostrils daily. 16 g 6   naproxen  (NAPROSYN ) 500 MG tablet Take 1 tablet (500 mg total) by mouth 2 (two) times daily as needed for moderate pain (pain score 4-6). For back pain 40 tablet 1   atorvastatin  (LIPITOR) 10 MG tablet Take 1 tablet (10 mg total) by mouth daily. For cholesterol (Patient taking differently: Take 10 mg by mouth daily with lunch. For cholesterol) 90 tablet 1   loratadine  (CLARITIN ) 10 MG tablet Take 1 tablet (10 mg total) by mouth daily. 30 tablet 11   losartan -hydrochlorothiazide  (HYZAAR ) 50-12.5 MG tablet Take 1 tablet by mouth daily. 90 tablet 1   metFORMIN  (GLUCOPHAGE ) 500 MG tablet Take 2 tablets (1,000 mg total) by mouth daily with breakfast. For Diabetes (Patient taking differently: Take 1,000 mg by mouth daily with lunch. For Diabetes) 180 tablet 1   acetaminophen  (TYLENOL ) 500 MG tablet Take 2 tablets (1,000 mg total) by mouth every 6 (six) hours as needed for mild pain (pain score  1-3). (Patient not taking: Reported on 04/03/2024)     amLODipine  (NORVASC ) 10 MG tablet Take 1 tablet (10 mg total) by mouth daily. For blood pressure (Patient not taking: Reported on 04/03/2024) 90 tablet 0   Olopatadine  HCl 0.2 % SOLN Apply to both eyes once daily. (Patient not taking: Reported on 04/03/2024) 2.5 mL 1   oxyCODONE  (OXY IR/ROXICODONE ) 5 MG immediate release tablet Take 1 tablet (5 mg total) by mouth every 4 (four) hours as needed for severe pain (pain score 7-10). (Patient not taking: Reported on 04/03/2024) 30 tablet 0   No facility-administered medications prior to visit.    No Known Allergies     Objective:    BP 126/72 (BP Location: Left Arm, Patient Position: Sitting, Cuff Size: Normal)   Pulse 70   Resp 20   Ht 5\' 5"  (1.651 m)   Wt 209 lb 9.6 oz (95.1 kg)   SpO2 98%   BMI 34.88  kg/m  Wt Readings from Last 3 Encounters:  04/03/24 209 lb 9.6 oz (95.1 kg)  01/02/24 212 lb 6.4 oz (96.3 kg)  11/23/23 204 lb (92.5 kg)    Physical Exam Vitals and nursing note reviewed.  Constitutional:      Appearance: He is well-developed.  HENT:     Head: Normocephalic and atraumatic.  Cardiovascular:     Rate and Rhythm: Normal rate and regular rhythm.     Heart sounds: Normal heart sounds. No murmur heard.    No friction rub. No gallop.  Pulmonary:     Effort: Pulmonary effort is normal. No tachypnea or respiratory distress.     Breath sounds: Normal breath sounds. No decreased breath sounds, wheezing, rhonchi or rales.  Chest:     Chest wall: No tenderness.  Abdominal:     General: Bowel sounds are normal.     Palpations: Abdomen is soft.  Musculoskeletal:        General: Normal range of motion.     Cervical back: Normal range of motion.  Skin:    General: Skin is warm and dry.       Neurological:     Mental Status: He is alert and oriented to person, place, and time.     Coordination: Coordination normal.  Psychiatric:        Behavior: Behavior normal. Behavior  is cooperative.        Thought Content: Thought content normal.        Judgment: Judgment normal.          Patient has been counseled extensively about nutrition and exercise as well as the importance of adherence with medications and regular follow-up. The patient was given clear instructions to go to ER or return to medical center if symptoms don't improve, worsen or new problems develop. The patient verbalized understanding.   Follow-up: Return in about 3 months (around 07/04/2024).   Austin Dean, FNP-BC [facility] [place], [place] [phone]   04/03/2024, 5:39 PM

## 2024-04-04 ENCOUNTER — Other Ambulatory Visit: Payer: Self-pay

## 2024-04-13 ENCOUNTER — Other Ambulatory Visit: Payer: Self-pay

## 2024-04-28 ENCOUNTER — Emergency Department (HOSPITAL_COMMUNITY): Payer: Self-pay

## 2024-04-28 ENCOUNTER — Ambulatory Visit (HOSPITAL_COMMUNITY)
Admission: EM | Admit: 2024-04-28 | Discharge: 2024-04-28 | Disposition: A | Discharge status: Home / Self Care | Payer: Self-pay | Attending: Family Medicine | Admitting: Family Medicine

## 2024-04-28 ENCOUNTER — Encounter (HOSPITAL_COMMUNITY): Payer: Self-pay

## 2024-04-28 ENCOUNTER — Other Ambulatory Visit: Payer: Self-pay

## 2024-04-28 ENCOUNTER — Emergency Department (HOSPITAL_COMMUNITY)
Admission: EM | Admit: 2024-04-28 | Discharge: 2024-04-28 | Disposition: A | Discharge status: Home / Self Care | Payer: Self-pay | Attending: Emergency Medicine | Admitting: Emergency Medicine

## 2024-04-28 ENCOUNTER — Encounter (HOSPITAL_COMMUNITY): Payer: Self-pay | Admitting: Emergency Medicine

## 2024-04-28 DIAGNOSIS — R1310 Dysphagia, unspecified: Secondary | ICD-10-CM

## 2024-04-28 DIAGNOSIS — J36 Peritonsillar abscess: Secondary | ICD-10-CM

## 2024-04-28 DIAGNOSIS — D72829 Elevated white blood cell count, unspecified: Secondary | ICD-10-CM | POA: Insufficient documentation

## 2024-04-28 DIAGNOSIS — I10 Essential (primary) hypertension: Secondary | ICD-10-CM | POA: Insufficient documentation

## 2024-04-28 DIAGNOSIS — Z79899 Other long term (current) drug therapy: Secondary | ICD-10-CM | POA: Insufficient documentation

## 2024-04-28 LAB — CBC WITH DIFFERENTIAL/PLATELET
Abs Immature Granulocytes: 0.04 10*3/uL (ref 0.00–0.07)
Basophils Absolute: 0 10*3/uL (ref 0.0–0.1)
Basophils Relative: 0 %
Eosinophils Absolute: 0.2 10*3/uL (ref 0.0–0.5)
Eosinophils Relative: 1 %
HCT: 49.3 % (ref 39.0–52.0)
Hemoglobin: 17 g/dL (ref 13.0–17.0)
Immature Granulocytes: 0 %
Lymphocytes Relative: 18 %
Lymphs Abs: 2 10*3/uL (ref 0.7–4.0)
MCH: 30.2 pg (ref 26.0–34.0)
MCHC: 34.5 g/dL (ref 30.0–36.0)
MCV: 87.7 fL (ref 80.0–100.0)
Monocytes Absolute: 0.7 10*3/uL (ref 0.1–1.0)
Monocytes Relative: 6 %
Neutro Abs: 8.4 10*3/uL — ABNORMAL HIGH (ref 1.7–7.7)
Neutrophils Relative %: 75 %
Platelets: 287 10*3/uL (ref 150–400)
RBC: 5.62 MIL/uL (ref 4.22–5.81)
RDW: 12.7 % (ref 11.5–15.5)
WBC: 11.3 10*3/uL — ABNORMAL HIGH (ref 4.0–10.5)
nRBC: 0 % (ref 0.0–0.2)

## 2024-04-28 LAB — BASIC METABOLIC PANEL WITH GFR
Anion gap: 11 (ref 5–15)
BUN: 16 mg/dL (ref 6–20)
CO2: 28 mmol/L (ref 22–32)
Calcium: 9.5 mg/dL (ref 8.9–10.3)
Chloride: 99 mmol/L (ref 98–111)
Creatinine, Ser: 1.07 mg/dL (ref 0.61–1.24)
GFR, Estimated: >60 mL/min (ref >60)
Glucose, Bld: 111 mg/dL — ABNORMAL HIGH (ref 70–99)
Potassium: 4.7 mmol/L (ref 3.5–5.1)
Sodium: 138 mmol/L (ref 135–145)

## 2024-04-28 LAB — I-STAT CHEM 8, ED
BUN: 20 mg/dL (ref 6–20)
Calcium, Ion: 1.16 mmol/L (ref 1.15–1.40)
Chloride: 101 mmol/L (ref 98–111)
Creatinine, Ser: 1 mg/dL (ref 0.61–1.24)
Glucose, Bld: 111 mg/dL — ABNORMAL HIGH (ref 70–99)
HCT: 51 % (ref 39.0–52.0)
Hemoglobin: 17.3 g/dL — ABNORMAL HIGH (ref 13.0–17.0)
Potassium: 4.4 mmol/L (ref 3.5–5.1)
Sodium: 139 mmol/L (ref 135–145)
TCO2: 28 mmol/L (ref 22–32)

## 2024-04-28 LAB — RESP PANEL BY RT-PCR (RSV, FLU A&B, COVID)  RVPGX2
Influenza A by PCR: NEGATIVE
Influenza B by PCR: NEGATIVE
Resp Syncytial Virus by PCR: NEGATIVE
SARS Coronavirus 2 by RT PCR: NEGATIVE

## 2024-04-28 LAB — GROUP A STREP BY PCR: Group A Strep by PCR: NOT DETECTED

## 2024-04-28 LAB — I-STAT CG4 LACTIC ACID, ED
Lactic Acid, Venous: 1.8 mmol/L (ref 0.5–1.9)
Lactic Acid, Venous: 0.8 mmol/L (ref 0.5–1.9)

## 2024-04-28 MED ORDER — AMOXICILLIN-POT CLAVULANATE 875-125 MG PO TABS
1.0000 | ORAL_TABLET | Freq: Once | ORAL | Status: AC
Start: 2024-04-28 — End: 2024-04-28
  Administered 2024-04-28: 1 via ORAL
  Filled 2024-04-28: qty 1

## 2024-04-28 MED ORDER — AMOXICILLIN-POT CLAVULANATE 875-125 MG PO TABS
1.0000 | ORAL_TABLET | Freq: Two times a day (BID) | ORAL | 0 refills | Status: DC
  Filled 2024-04-28: qty 14, 7d supply, fill #0

## 2024-04-28 MED ORDER — DEXAMETHASONE SODIUM PHOSPHATE 10 MG/ML IJ SOLN
10.0000 mg | Freq: Once | INTRAMUSCULAR | Status: AC
Start: 2024-04-28 — End: 2024-04-28
  Administered 2024-04-28: 10 mg via INTRAVENOUS
  Filled 2024-04-28: qty 1

## 2024-04-28 MED ORDER — OXYCODONE HCL 5 MG PO TABS
5.0000 mg | ORAL_TABLET | Freq: Once | ORAL | Status: AC
  Administered 2024-04-28: 5 mg via ORAL
  Filled 2024-04-28: qty 1

## 2024-04-28 MED ORDER — OXYCODONE HCL 5 MG PO TABS
5.0000 mg | ORAL_TABLET | Freq: Four times a day (QID) | ORAL | 0 refills | Status: AC | PRN
  Filled 2024-04-28: qty 12, 3d supply, fill #0

## 2024-04-28 MED ORDER — IOHEXOL 350 MG/ML SOLN
75.0000 mL | Freq: Once | INTRAVENOUS | Status: AC | PRN
  Administered 2024-04-28: 75 mL via INTRAVENOUS

## 2024-04-28 NOTE — ED Triage Notes (Signed)
 Patient is pointing to left side of neck, feels it is swollen and is painful to swallow.  Denies fever.    Patient did have a cough initially, has a productive cough-phlegm was clear, now yellowish.    Cough for one week.  Throat pain started on Tuesday and cough decreased .    Has had tylenol 

## 2024-04-28 NOTE — ED Provider Notes (Signed)
 [place] EMERGENCY DEPARTMENT AT [facility] Provider Note   CSN: [id number] Arrival date & time: 04/28/24  1612     Patient presents with: Cough   Austin Rubio is a 48 y.o. male with past medical history of HTN, HLD presents to emergency department for evaluation of left neck pain that started on Tuesday with associated difficulty and painful swallowing.  He also recently had a productive cough of phlegm that started 2 weeks ago which is significantly improved.  He denies chest pain, shortness breath, fevers at home, hoarse voice.  Was initially evaluated urgent care who recommended ED evaluation for concern of left tonsillar abscess    Cough      Prior to Admission medications   Medication Sig Start Date End Date Taking? Authorizing Provider  amoxicillin -clavulanate (AUGMENTIN) 875-125 MG tablet Take 1 tablet by mouth every 12 (twelve) hours. 04/28/24  Yes Minnie Tinnie BRAVO, PA  atorvastatin  (LIPITOR) 10 MG tablet Take 1 tablet (10 mg total) by mouth daily with lunch. For cholesterol. 04/03/24   Fleming, Zelda W, NP  fluticasone  (FLONASE ) 50 MCG/ACT nasal spray Place 2 sprays into both nostrils daily. 01/02/24   Fleming, Zelda W, NP  loratadine  (CLARITIN ) 10 MG tablet Take 1 tablet (10 mg total) by mouth daily. 04/03/24   Fleming, Zelda W, NP  losartan -hydrochlorothiazide  (HYZAAR ) 50-12.5 MG tablet Take 1 tablet by mouth daily. FOR BLOOD PRESSURE. 04/03/24   Fleming, Zelda W, NP  metFORMIN  (GLUCOPHAGE ) 500 MG tablet Take 2 tablets (1,000 mg total) by mouth daily with lunch. For Diabetes. 04/03/24   Fleming, Zelda W, NP  naproxen  (NAPROSYN ) 500 MG tablet Take 1 tablet (500 mg total) by mouth 2 (two) times daily as needed for moderate pain (pain score 4-6). For back pain 11/10/23   Desiderio Schanz, MD    Allergies: Patient has no known allergies.    Review of Systems  Respiratory:  Positive for cough.     Updated Vital Signs BP (!) 155/99   Pulse 90   Temp 98 F (36.7 C)  (Oral)   Resp 17   Ht 5' 5 (1.651 m)   Wt 95.1 kg   SpO2 97%   BMI 34.89 kg/m   Physical Exam Vitals and nursing note reviewed.  Constitutional:      General: He is not in acute distress.    Appearance: Normal appearance.  HENT:     Head: Normocephalic and atraumatic.     Mouth/Throat:     Mouth: Mucous membranes are moist. No oral lesions.     Pharynx: No uvula swelling.     Tonsils: Tonsillar abscess (left) present. No tonsillar exudate. 1+ on the right. 3+ on the left.     Comments: Mildly deviated uvula to right.  Maintain secretions without difficulty.  No drooling.  Eyes:     Conjunctiva/sclera: Conjunctivae normal.    Cardiovascular:     Rate and Rhythm: Normal rate.  Pulmonary:     Effort: Pulmonary effort is normal. No respiratory distress.     Comments: Speaking in full complete sentences without difficulty.  Lung sounds CTAB  Skin:    Coloration: Skin is not jaundiced or pale.   Neurological:     Mental Status: He is alert. Mental status is at baseline.     (all labs ordered are listed, but only abnormal results are displayed) Labs Reviewed  CBC WITH DIFFERENTIAL/PLATELET - Abnormal; Notable for the following components:      Result Value  WBC 11.3 (*)    Neutro Abs 8.4 (*)    All other components within normal limits  BASIC METABOLIC PANEL WITH GFR - Abnormal; Notable for the following components:   Glucose, Bld 111 (*)    All other components within normal limits  I-STAT CHEM 8, ED - Abnormal; Notable for the following components:   Glucose, Bld 111 (*)    Hemoglobin 17.3 (*)    All other components within normal limits  RESP PANEL BY RT-PCR (RSV, FLU A&B, COVID)  [id number]  GROUP A STREP BY PCR  I-STAT CG4 LACTIC ACID, ED  I-STAT CG4 LACTIC ACID, ED    EKG: None  Radiology: CT Soft Tissue Neck W Contrast Result Date: 04/28/2024 CLINICAL DATA:  Soft tissue infection suspected, neck, xray done r/o left tonsilar abscess EXAM: CT NECK WITH  CONTRAST TECHNIQUE: Multidetector CT imaging of the neck was performed using the standard protocol following the bolus administration of intravenous contrast. RADIATION DOSE REDUCTION: This exam was performed according to the departmental dose-optimization program which includes automated exposure control, adjustment of the mA and/or kV according to patient size and/or use of iterative reconstruction technique. CONTRAST:  75mL OMNIPAQUE IOHEXOL 350 MG/ML SOLN COMPARISON:  None Available. FINDINGS: Pharynx and larynx: Enhancing and edematous tonsils, left greater than right, compatible with tonsillitis. Approximately 1.7 x 1.3 cm peripherally enhancing fluid collection left tonsil, compatible with abscess. Mild surrounding edema. Two normal air ex. Salivary glands: No inflammation, mass, or stone. Thyroid: Normal. Lymph nodes: Mildly prominent asymmetric left upper cervical chain lymph nodes, nonspecific but likely reactive given the above findings. Vascular: Negative. Limited intracranial: Negative. Visualized orbits: Negative. Mastoids and visualized paranasal sinuses: Clear. Skeleton: No acute or aggressive process. Upper chest: Visualized lung apices are clear. IMPRESSION: 1. Findings compatible with tonsillitis and 1.7 cm left tonsillar abscess. 2. Mildly prominent and asymmetric left upper cervical chain lymph nodes, nonspecific but likely reactive given the above findings Electronically Signed   By: Gilmore GORMAN Molt M.D.   On: 04/28/2024 20:09     Medications Ordered in the ED  amoxicillin -clavulanate (AUGMENTIN) 875-125 MG per tablet 1 tablet (has no administration in time range)  dexamethasone  (DECADRON ) injection 10 mg (10 mg Intravenous Given 04/28/24 1839)  iohexol (OMNIPAQUE) 350 MG/ML injection 75 mL (75 mLs Intravenous Contrast Given 04/28/24 1934)                                    Medical Decision Making Amount and/or Complexity of Data Reviewed Labs: ordered. Radiology:  ordered.  Risk Prescription drug management.   Patient presents to the ED for concern of painful swallowing, left neck pain, this involves an extensive number of treatment options, and is a complaint that carries with it a high risk of complications and morbidity.  The differential diagnosis includes strep, viral pharyngitis, tonsillitis, retropharyngeal abscess, PTA, foreign body   Co morbidities that complicate the patient evaluation  None   Additional history obtained:  Additional history obtained from  Nursing   External records from outside source obtained and reviewed including triage RN note   Lab Tests:  I Ordered, and personally interpreted labs.  The pertinent results include:   My leukocytosis of 11.3 CBG 111 Respiratory panel and strep negative. Lactic acid negative   Imaging Studies ordered:  I ordered imaging studies including CT soft tissue I independently visualized and interpreted imaging which showed tonsillitis, 1.7 cm left tonsillar  abscess I agree with the radiologist interpretation     Medicines ordered and prescription drug management:  I ordered medication including augmentin  for PTA  Reevaluation of the patient after these medicines showed that the patient stayed the same I have reviewed the patients home medicines and have made adjustments as needed    Consultations Obtained:  I requested consultation with ENT Dr. Maggie,  and discussed lab and imaging findings as well as pertinent plan - they recommend: I&D at bedside, augmentin, outpatient f/u if symptoms worsen   Problem List / ED Course:  Left tonsillar swelling Mild leukocytosis of 11.3.  No elevated lactate.  No fever nor tachycardia Respiratory panel and strep negative CT soft tissue neck with contrast is notable for 1.7 cm tonsillar abscess Individually assessed by ENT Dr. Maggie who drained abscess.  See his note. Will provide dose of Augmentin here in emergency  department as well as prescription.  Discussed antibiotic stewardship. Discussed strict return precautions   Reevaluation:  After the interventions noted above, I reevaluated the patient and found that they have :improved     Dispostion:  After consideration of the diagnostic results and the patients response to treatment, I feel that the patent would benefit from outpatient management with symptomatic care.  Discussed ED workup, disposition, return to ED precautions with patient who expresses understanding agrees with plan.  All questions answered to their satisfaction.  They are agreeable to plan.  Discharge instructions provided on paperwork  Final diagnoses:  Peritonsillar abscess  Painful swallowing    ED Discharge Orders          Ordered    amoxicillin -clavulanate (AUGMENTIN) 875-125 MG tablet  Every 12 hours        04/28/24 2210               Minnie Tinnie BRAVO, PA 04/28/24 2213    Patsey Lot, MD 04/28/24 2316

## 2024-04-28 NOTE — Consult Note (Signed)
 ENT CONSULT:  Reason for Consult: Peritonsillar abscess  Referring Physician: Emergency department  HPI: Austin Rubio is an 48 y.o. male with acute tonsillitis and worsening symptoms prompting an ER visit.  CT was obtained and showed a 1.7 cm left peritonsillar abscess.  I was consulted for drainage.   Past Medical History:  Diagnosis Date   Complication of anesthesia    syncope after getting home from surgery.   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    Past Surgical History:  Procedure Laterality Date   INGUINAL HERNIA REPAIR Right 2005   INGUINAL HERNIA REPAIR Left 11/10/2023   Procedure: HERNIA REPAIR INGUINAL ADULT, open;  Surgeon: Desiderio Schanz, MD;  Location: [facility];  Service: General;  Laterality: Left;   INSERTION OF MESH  11/10/2023   Procedure: INSERTION OF MESH;  Surgeon: Desiderio Schanz, MD;  Location: [facility];  Service: General;;   MELANOMA EXCISION Left 11/24/2021   Procedure: WIDE EXCISION OF LEFT CHEST SPINDLE CELL NEOPLASM;  Surgeon: Belinda Cough, MD;  Location: [facility];  Service: General;  Laterality: Left;  LMA   XI ROBOTIC ASSISTED VENTRAL HERNIA N/A 03/06/2020   Procedure: XI ROBOTIC ASSISTED VENTRAL HERNIA;  Surgeon: Jordis Laneta FALCON, MD;  Location: [facility];  Service: General;  Laterality: N/A;    Family History  Problem Relation Age of Onset   Diabetes Mother    Heart disease Father     Social History:  reports that he has quit smoking. His smoking use included cigarettes. He has been exposed to tobacco smoke. He has never used smokeless tobacco. He reports that he does not currently use alcohol. He reports that he does not use drugs.  Allergies: No Known Allergies  Medications: I have reviewed the patient's current medications.  Results for orders placed or performed during  the hospital encounter of 04/28/24 (from the past 48 hours)  Group A Strep by PCR     Status: None   Collection Time: 04/28/24  6:26 PM   Specimen: Throat; Sterile Swab  Result Value Ref Range   Group A Strep by PCR NOT DETECTED NOT DETECTED    Comment: Performed at [facility], [address], [place], [place] [address]  CBC with Differential     Status: Abnormal   Collection Time: 04/28/24  6:29 PM  Result Value Ref Range   WBC 11.3 (H) 4.0 - 10.5 K/uL   RBC 5.62 4.22 - 5.81 MIL/uL   Hemoglobin 17.0 13.0 - 17.0 g/dL   HCT 50.6 60.9 - 47.9 %   MCV 87.7 80.0 - 100.0 fL   MCH 30.2 26.0 - 34.0 pg   MCHC 34.5 30.0 - 36.0 g/dL   RDW 87.2 88.4 - 84.4 %   Platelets 287 150 - 400 K/uL   nRBC 0.0 0.0 - 0.2 %   Neutrophils Relative % 75 %   Neutro Abs 8.4 (H) 1.7 - 7.7 K/uL   Lymphocytes Relative 18 %   Lymphs Abs 2.0 0.7 - 4.0 K/uL   Monocytes Relative 6 %   Monocytes Absolute 0.7 0.1 - 1.0 K/uL   Eosinophils Relative 1 %   Eosinophils Absolute 0.2 0.0 - 0.5 K/uL   Basophils Relative 0 %   Basophils Absolute 0.0 0.0 - 0.1 K/uL   Immature Granulocytes 0 %  Abs Immature Granulocytes 0.04 0.00 - 0.07 K/uL    Comment: Performed at [facility], [address] [place], [place] [address]  Basic metabolic panel     Status: Abnormal   Collection Time: 04/28/24  6:29 PM  Result Value Ref Range   Sodium 138 135 - 145 mmol/L   Potassium 4.7 3.5 - 5.1 mmol/L    Comment: HEMOLYSIS AT THIS LEVEL MAY AFFECT RESULT   Chloride 99 98 - 111 mmol/L   CO2 28 22 - 32 mmol/L   Glucose, Bld 111 (H) 70 - 99 mg/dL    Comment: Glucose reference range applies only to samples taken after fasting for at least 8 hours.   BUN 16 6 - 20 mg/dL   Creatinine, Ser 8.92 0.61 - 1.24 mg/dL   Calcium  9.5 8.9 - 10.3 mg/dL   GFR, Estimated >39 >39 mL/min    Comment: (NOTE) Calculated using the CKD-EPI Creatinine Equation (2021)    Anion gap 11 5 - 15    Comment: Performed at [facility],  [address], [place], [place] [address]  Resp panel by RT-PCR (RSV, Flu A&B, Covid) Anterior Nasal Swab     Status: None   Collection Time: 04/28/24  6:29 PM   Specimen: Anterior Nasal Swab  Result Value Ref Range   SARS Coronavirus 2 by RT PCR NEGATIVE NEGATIVE   Influenza A by PCR NEGATIVE NEGATIVE   Influenza B by PCR NEGATIVE NEGATIVE    Comment: (NOTE) The Xpert Xpress SARS-CoV-2/FLU/RSV plus assay is intended as an aid in the diagnosis of influenza from Nasopharyngeal swab specimens and should not be used as a sole basis for treatment. Nasal washings and aspirates are unacceptable for Xpert Xpress SARS-CoV-2/FLU/RSV testing.  Fact Sheet for Patients: BloggerCourse.com  Fact Sheet for Healthcare Providers: SeriousBroker.it  This test is not yet approved or cleared by the [place]  FDA and has been authorized for detection and/or diagnosis of SARS-CoV-2 by FDA under an Emergency Use Authorization (EUA). This EUA will remain in effect (meaning this test can be used) for the duration of the COVID-19 declaration under Section 564(b)(1) of the Act, 21 U.S.C. section 360bbb-3(b)(1), unless the authorization is terminated or revoked.     Resp Syncytial Virus by PCR NEGATIVE NEGATIVE    Comment: (NOTE) Fact Sheet for Patients: BloggerCourse.com  Fact Sheet for Healthcare Providers: SeriousBroker.it  This test is not yet approved or cleared by the [place]  FDA and has been authorized for detection and/or diagnosis of SARS-CoV-2 by FDA under an Emergency Use Authorization (EUA). This EUA will remain in effect (meaning this test can be used) for the duration of the COVID-19 declaration under Section 564(b)(1) of the Act, 21 U.S.C. section 360bbb-3(b)(1), unless the authorization is terminated or revoked.  Performed at [facility], [address], [place],  [place] [address]   I-stat chem 8, ED (not at [facility], [facility] or [facility])     Status: Abnormal   Collection Time: 04/28/24  6:41 PM  Result Value Ref Range   Sodium 139 135 - 145 mmol/L   Potassium 4.4 3.5 - 5.1 mmol/L   Chloride 101 98 - 111 mmol/L   BUN 20 6 - 20 mg/dL   Creatinine, Ser 8.99 0.61 - 1.24 mg/dL   Glucose, Bld 888 (H) 70 - 99 mg/dL    Comment: Glucose reference range applies only to samples taken after fasting for at least 8 hours.   Calcium , Ion 1.16 1.15 -  1.40 mmol/L   TCO2 28 22 - 32 mmol/L   Hemoglobin 17.3 (H) 13.0 - 17.0 g/dL   HCT 48.9 60.9 - 47.9 %  I-Stat CG4 Lactic Acid     Status: None   Collection Time: 04/28/24  6:43 PM  Result Value Ref Range   Lactic Acid, Venous 1.8 0.5 - 1.9 mmol/L  I-Stat CG4 Lactic Acid     Status: None   Collection Time: 04/28/24  9:34 PM  Result Value Ref Range   Lactic Acid, Venous 0.8 0.5 - 1.9 mmol/L    CT Soft Tissue Neck W Contrast Result Date: 04/28/2024 CLINICAL DATA:  Soft tissue infection suspected, neck, xray done r/o left tonsilar abscess EXAM: CT NECK WITH CONTRAST TECHNIQUE: Multidetector CT imaging of the neck was performed using the standard protocol following the bolus administration of intravenous contrast. RADIATION DOSE REDUCTION: This exam was performed according to the departmental dose-optimization program which includes automated exposure control, adjustment of the mA and/or kV according to patient size and/or use of iterative reconstruction technique. CONTRAST:  75mL OMNIPAQUE IOHEXOL 350 MG/ML SOLN COMPARISON:  None Available. FINDINGS: Pharynx and larynx: Enhancing and edematous tonsils, left greater than right, compatible with tonsillitis. Approximately 1.7 x 1.3 cm peripherally enhancing fluid collection left tonsil, compatible with abscess. Mild surrounding edema. Two normal air ex. Salivary glands: No inflammation, mass, or stone. Thyroid: Normal. Lymph nodes: Mildly prominent asymmetric left upper cervical chain lymph  nodes, nonspecific but likely reactive given the above findings. Vascular: Negative. Limited intracranial: Negative. Visualized orbits: Negative. Mastoids and visualized paranasal sinuses: Clear. Skeleton: No acute or aggressive process. Upper chest: Visualized lung apices are clear. IMPRESSION: 1. Findings compatible with tonsillitis and 1.7 cm left tonsillar abscess. 2. Mildly prominent and asymmetric left upper cervical chain lymph nodes, nonspecific but likely reactive given the above findings Electronically Signed   By: Gilmore GORMAN Molt M.D.   On: 04/28/2024 20:09    MND:wzhjupcz other than stated per HPI  Blood pressure (!) 155/99, pulse 90, temperature 98 F (36.7 C), temperature source Oral, resp. rate 17, height 5' 5 (1.651 m), weight 95.1 kg, SpO2 97%.  PHYSICAL EXAM:  CONSTITUTIONAL: well developed, nourished, no distress and alert and oriented x 3 EYES: PERRL, EOMI  Mouth/Throat:  Mouth: uvula deviated to right Throat: Left soft palate swelling  Procedure Note: After informed consent the left oropharynx was topically anesthetized with benzocaine and injected with 1% lidocaine  with 1-100,000 epinephrine .  A 1.5 cm incision was made superior to the left tonsil at the level of the soft palate and spread with a clamp.  Approximately 5 cc of purulence was noted from the abscess cavity which was cultured.  Assessment/Plan: Peritonsillar abscess - Drained at bedside. Recommend oral antibiotics for 7-10 days. Follow in office if symptoms are worsening   Zach Kelson Queenan MD [facility]  04/28/2024, 10:10 PM

## 2024-04-28 NOTE — ED Triage Notes (Signed)
 The pt has had a cough for 2 weeks  for the past 3-4 days his throat has been painful notemp

## 2024-04-28 NOTE — ED Provider Notes (Signed)
  [facility]   [id number] 04/28/24 Arrival Time: 1429  ASSESSMENT & PLAN:  1. Peritonsillar abscess    Concern for left peritonsillar abscess. Controlling secretions and able to speak clearly. To ED for further evaluation. By POV; stable upon discharge.  Reviewed expectations re: course of current medical issues. Questions answered. Outlined signs and symptoms indicating need for more acute intervention. Patient verbalized understanding. After Visit Summary given.   SUBJECTIVE: Spanish video interpreter used. Austin Rubio is a 48 y.o. male who reports a sore throat; left-sided; x 4-5 days; preceded by URI symptoms and cough for one week. Now with painful swallowing. Family member denies voice changes. Ques subj fever. Pain extends to left neck. Denies SOB.  Tylenol  PTA with some help.  OBJECTIVE:  Vitals:   04/28/24 1529  BP: (!) 151/97  Pulse: 91  Resp: 18  Temp: 98.5 F (36.9 C)  TempSrc: Oral  SpO2: 94%     General appearance: alert; no distress HEENT: throat with moderate erythema and swelling of left soft palette; uvula is deviated to the right Neck: supple with FROM but with TTP over L side at jaw Lungs: speaks full sentences without difficulty; unlabored Abd: soft; non-tender Skin: reveals no rash; warm and dry Psychological: alert and cooperative; normal mood and affect  No Known Allergies  Past Medical History:  Diagnosis Date   Complication of anesthesia    syncope after getting home from surgery.   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   Social History   Socioeconomic History   Marital status: Married    Spouse name: Frederik Cancel   Number of children: 3   Years of education: Not on file   Highest education level: Not on file  Occupational History   Not on file  Tobacco Use    Smoking status: Former    Types: Cigarettes    Passive exposure: Past   Smokeless tobacco: Never  Vaping Use   Vaping status: Never Used  Substance and Sexual Activity   Alcohol use: Not Currently    Comment: socially   Drug use: No   Sexual activity: Yes    Partners: Female    Birth control/protection: None  Other Topics Concern   Not on file  Social History Narrative   Not on file   Social Drivers of Health   Financial Resource Strain: Medium Risk (01/07/2022)   Overall Financial Resource Strain (CARDIA)    Difficulty of Paying Living Expenses: Somewhat hard  Food Insecurity: No Food Insecurity (01/07/2022)   Hunger Vital Sign    Worried About Running Out of Food in the Last Year: Never true    Ran Out of Food in the Last Year: Never true  Transportation Needs: No Transportation Needs (01/07/2022)   PRAPARE - Administrator, Civil Service (Medical): No    Lack of Transportation (Non-Medical): No  Physical Activity: Not on file  Stress: Not on file  Social Connections: Not on file  Intimate Partner Violence: Not on file   Family History  Problem Relation Age of Onset   Diabetes Mother    Heart disease Father            Rolinda Rogue, MD 04/28/24 [phone]

## 2024-04-28 NOTE — Discharge Instructions (Signed)
 Thank for let us  evaluate you today.  We have drained your peritonsillar abscess.  I have provided you with antibiotic prescription for you to take.  You may follow-up with ENT if your symptoms worsen however should improve with antibiotics  Return to Emergency Department if you experience difficulty swallowing, painful swallowing, inability to swallow liquids, significant worsening symptoms

## 2024-04-28 NOTE — ED Notes (Signed)
 First lac 1.8

## 2024-04-28 NOTE — ED Notes (Signed)
 Patient is being discharged from the Urgent Care and sent to the Emergency Department via POV . Per Dr. Rolinda, patient is in need of higher level of care due to peritonsillar abscess. Patient is aware and verbalizes understanding of plan of care.  Vitals:   04/28/24 1529  BP: (!) 151/97  Pulse: 91  Resp: 18  Temp: 98.5 F (36.9 C)  SpO2: 94%

## 2024-04-30 ENCOUNTER — Other Ambulatory Visit: Payer: Self-pay

## 2024-05-01 ENCOUNTER — Other Ambulatory Visit: Payer: Self-pay

## 2024-05-02 LAB — AEROBIC/ANAEROBIC CULTURE W GRAM STAIN (SURGICAL/DEEP WOUND)
Culture: NORMAL
Special Requests: NORMAL

## 2024-05-03 ENCOUNTER — Telehealth (HOSPITAL_BASED_OUTPATIENT_CLINIC_OR_DEPARTMENT_OTHER): Payer: Self-pay

## 2024-05-03 NOTE — Telephone Encounter (Signed)
 Post ED Visit - Positive Culture Follow-up  Culture report reviewed by antimicrobial stewardship pharmacist: [facility] [x]  [place], [place].D. []  Venetia Gully, Pharm.D., BCPS AQ-ID []  Garrel Crews, Pharm.D., BCPS []  Almarie Lunger, [address].D., BCPS []  [place], [address].D., BCPS, AAHIVP []  Rosaline Bihari, Pharm.D., BCPS, AAHIVP []  Vernell Meier, PharmD, BCPS []  Latanya Hint, PharmD, BCPS []  Donald Medley, PharmD, BCPS []  Rocky Bold, PharmD []  Dorothyann Alert, PharmD, BCPS []  Morene Babe, PharmD  [facility] []  Rosaline Edison, PharmD []  Romona Bliss, PharmD []  Dolphus Roller, PharmD []  Veva Seip, Rph []  Vernell Daunt) Leonce, PharmD []  Eva Allis, PharmD []  Rosaline Millet, PharmD []  Iantha Batch, PharmD []  Arvin Gauss, PharmD []  Wanda Hasting, PharmD []  Ronal Rav, PharmD []  Rocky Slade, PharmD []  Bard Jeans, PharmD   Positive throat culture Presented with peritonsillar abscess. Treated with Amoxicillin , abx ok. No further patient follow-up is required at this time.  Ruth Camelia Elbe 05/03/2024, 9:41 AM

## 2024-05-16 ENCOUNTER — Ambulatory Visit (INDEPENDENT_AMBULATORY_CARE_PROVIDER_SITE_OTHER): Payer: Self-pay | Admitting: Physician Assistant

## 2024-05-16 ENCOUNTER — Encounter: Payer: Self-pay | Admitting: Physician Assistant

## 2024-05-16 VITALS — BP 126/73

## 2024-05-16 DIAGNOSIS — Z85828 Personal history of other malignant neoplasm of skin: Secondary | ICD-10-CM

## 2024-05-16 DIAGNOSIS — C44599 Other specified malignant neoplasm of skin of other part of trunk: Secondary | ICD-10-CM

## 2024-05-16 DIAGNOSIS — D485 Neoplasm of uncertain behavior of skin: Secondary | ICD-10-CM

## 2024-05-16 DIAGNOSIS — D489 Neoplasm of uncertain behavior, unspecified: Secondary | ICD-10-CM

## 2024-05-16 NOTE — Patient Instructions (Signed)

## 2024-05-16 NOTE — Progress Notes (Signed)
   New Patient Visit   Subjective  Austin Rubio is a 48 y.o. male who presents for the following: Swelling of left shoulder/neck x ~1 1/2 years that is getting a little bit bigger. It came up after he had surgery on his chest for dermatofibrosarcoma protuberans 12/25/2021 followed by radiation.  Is accompanied by translator today.     The following portions of the chart were reviewed this encounter and updated as appropriate: medications, allergies, medical history  Review of Systems:  No other skin or systemic complaints except as noted in HPI or Assessment and Plan.  Objective  Well appearing patient in no apparent distress; mood and affect are within normal limits.   A focused examination was performed of the following areas: left shoulder/neck      No palpable cervical lymphadenopathy.   Relevant exam findings are noted in the Assessment and Plan.    Assessment & Plan   NEOPLASM OF UNCERTAIN BEHAVIOR  - Referral back to general surgeon for re-evaluation - +/- imaging - Uncertain as to if this is related to his prior DFSP  HISTORY OF DERMATOFIBROMASARCOMA PROTUBERANS - No evidence of recurrence on left chest      DERMATOFIBROSARCOMA PROTUBERANS OF CHEST   NEOPLASM OF UNCERTAIN BEHAVIOR    Return if symptoms worsen or fail to improve.  I, Roseline Hutchinson, CMA, am acting as scribe for Jawana Reagor K, PA-C .   Documentation: I have reviewed the above documentation for accuracy and completeness, and I agree with the above.  Jaiah Weigel K, PA-C

## 2024-06-06 ENCOUNTER — Other Ambulatory Visit: Payer: Self-pay

## 2024-06-07 ENCOUNTER — Other Ambulatory Visit (HOSPITAL_COMMUNITY): Payer: Self-pay

## 2024-06-07 ENCOUNTER — Other Ambulatory Visit: Payer: Self-pay

## 2024-06-19 ENCOUNTER — Ambulatory Visit: Payer: Self-pay | Admitting: Surgery

## 2024-06-19 ENCOUNTER — Other Ambulatory Visit: Payer: Self-pay | Admitting: Surgery

## 2024-06-19 DIAGNOSIS — R222 Localized swelling, mass and lump, trunk: Secondary | ICD-10-CM

## 2024-06-20 ENCOUNTER — Ambulatory Visit (HOSPITAL_COMMUNITY)
Admission: RE | Admit: 2024-06-20 | Discharge: 2024-06-20 | Disposition: A | Discharge status: Home / Self Care | Source: Ambulatory Visit | Attending: Surgery | Admitting: Surgery

## 2024-06-20 DIAGNOSIS — R222 Localized swelling, mass and lump, trunk: Secondary | ICD-10-CM | POA: Insufficient documentation

## 2024-06-20 MED ORDER — GADOBUTROL 1 MMOL/ML IV SOLN
9.0000 mL | Freq: Once | INTRAVENOUS | Status: AC | PRN
  Administered 2024-06-20: 9 mL via INTRAVENOUS

## 2024-06-20 MED ORDER — GADOBUTROL 1 MMOL/ML IV SOLN: 9.0000 mL | Freq: Once | INTRAVENOUS | Status: DC | PRN

## 2024-06-27 ENCOUNTER — Other Ambulatory Visit: Payer: Self-pay

## 2024-06-29 ENCOUNTER — Other Ambulatory Visit: Payer: Self-pay

## 2024-07-03 ENCOUNTER — Telehealth: Payer: Self-pay | Admitting: Nurse Practitioner

## 2024-07-03 NOTE — Telephone Encounter (Signed)
 Pt confirmed 9/2

## 2024-07-04 ENCOUNTER — Other Ambulatory Visit: Payer: Self-pay

## 2024-07-04 ENCOUNTER — Encounter: Payer: Self-pay | Admitting: Nurse Practitioner

## 2024-07-04 ENCOUNTER — Ambulatory Visit: Payer: Self-pay | Attending: Nurse Practitioner | Admitting: Nurse Practitioner

## 2024-07-04 VITALS — BP 149/78 | HR 65 | Resp 18 | Ht 65.0 in | Wt 211.6 lb

## 2024-07-04 DIAGNOSIS — J3089 Other allergic rhinitis: Secondary | ICD-10-CM

## 2024-07-04 DIAGNOSIS — M79671 Pain in right foot: Secondary | ICD-10-CM

## 2024-07-04 DIAGNOSIS — Z23 Encounter for immunization: Secondary | ICD-10-CM

## 2024-07-04 DIAGNOSIS — R058 Other specified cough: Secondary | ICD-10-CM

## 2024-07-04 DIAGNOSIS — I10 Essential (primary) hypertension: Secondary | ICD-10-CM

## 2024-07-04 DIAGNOSIS — R0982 Postnasal drip: Secondary | ICD-10-CM

## 2024-07-04 MED ORDER — LEVOCETIRIZINE DIHYDROCHLORIDE 5 MG PO TABS
5.0000 mg | ORAL_TABLET | Freq: Every evening | ORAL | 0 refills | Status: DC
  Filled 2024-07-04: qty 30, 30d supply, fill #0

## 2024-07-04 NOTE — Progress Notes (Signed)
 Assessment & Plan:  Harmon was seen today for hypertension, cough and foot pain.  Diagnoses and all orders for this visit:  Primary hypertension Continue Hyzaar  Reminded to bring in blood pressure log for follow  up appointment.  RECOMMENDATIONS: DASH/Mediterranean Diets are healthier choices for HTN.    Need for influenza vaccination -     Flu vaccine trivalent PF, 6mos and older(Flulaval,Afluria,Fluarix,Fluzone)  Environmental and seasonal allergies -     levocetirizine (XYZAL ) 5 MG tablet; Take 1 tablet (5 mg total) by mouth every evening.  PND (post-nasal drip) -     levocetirizine (XYZAL ) 5 MG tablet; Take 1 tablet (5 mg total) by mouth every evening.  Nocturnal cough -     levocetirizine (XYZAL ) 5 MG tablet; Take 1 tablet (5 mg total) by mouth every evening. Intermittent cough when lying down, possibly due to postnasal drip or acid reflux. Occasional symptoms after coffee. Allergy medication and nasal spray may address postnasal drip. - Prescribe allergy medication and nasal spray. - Instruct to use nasal spray in the evening. - Follow up in three weeks to assess improvement.  Pain of right heel Plantar fasciitis, right foot Right heel pain consistent with plantar fasciitis, improving with walking. - Provide exercises to stretch the tendon in the heel. - Instruct to use a tennis ball to roll under the heel. - Follow up in three to four weeks to assess improvement.  Patient has been counseled on age-appropriate routine health concerns for screening and prevention. These are reviewed and up-to-date. Referrals have been placed accordingly. Immunizations are up-to-date or declined.    Subjective:   Chief Complaint  Patient presents with   Hypertension   Cough    Could be due to bp medication     Foot Pain    Both heels    History of Present Illness Austin Rubio is a 48 year old male who presents for follow up to HTN and with a persistent cough, particularly when  lying down to sleep.    He is currently taking Hyzaar  50-12.5 mg daily as prescribed. Blood pressure is elevated today.  BP Readings from Last 3 Encounters:  07/04/24 (!) 149/78  05/16/24 126/73  04/28/24 (!) 157/97     He experiences an intermittent cough primarily when lying down to sleep, which ceases once he falls asleep. The cough is not constant during the day but tends to start when he is trying to go to bed. He has not been taking his allergy medication recently as he no longer experiences nasal symptoms related to tree pollen so he stopped using his antihistamine and nasal spray. He attributes his cough to his blood pressure medication although he has been on losartan  for several years.  He typically eats dinner around 6 or 7 PM and goes to bed around 11 PM. He occasionally experiences heartburn, particularly after drinking coffee, but it is not frequent.  He reports heel pain, specifically in the right heel, which is worse in the morning upon waking. The pain sometimes extends slightly below the heel. Walking alleviates the pain temporarily, but it returns when he sits down again   Review of Systems  Constitutional:  Negative for fever, malaise/fatigue and weight loss.  HENT: Negative.  Negative for nosebleeds.   Eyes: Negative.  Negative for blurred vision, double vision and photophobia.  Respiratory:  Positive for cough. Negative for shortness of breath.   Cardiovascular: Negative.  Negative for chest pain, palpitations and leg swelling.  Gastrointestinal: Negative.  Negative for heartburn, nausea and vomiting.  Musculoskeletal:  Positive for joint pain. Negative for myalgias.  Neurological: Negative.  Negative for dizziness, focal weakness, seizures and headaches.  Endo/Heme/Allergies:  Positive for environmental allergies.  Psychiatric/Behavioral: Negative.  Negative for suicidal ideas.     Past Medical History:  Diagnosis Date   Complication of anesthesia    syncope  after getting home from surgery.   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    Past Surgical History:  Procedure Laterality Date   INGUINAL HERNIA REPAIR Right 2005   INGUINAL HERNIA REPAIR Left 11/10/2023   Procedure: HERNIA REPAIR INGUINAL ADULT, open;  Surgeon: Desiderio Schanz, MD;  Location: [facility];  Service: General;  Laterality: Left;   INSERTION OF MESH  11/10/2023   Procedure: INSERTION OF MESH;  Surgeon: Desiderio Schanz, MD;  Location: [facility];  Service: General;;   MELANOMA EXCISION Left 11/24/2021   Procedure: WIDE EXCISION OF LEFT CHEST SPINDLE CELL NEOPLASM;  Surgeon: Belinda Cough, MD;  Location: [facility];  Service: General;  Laterality: Left;  LMA   XI ROBOTIC ASSISTED VENTRAL HERNIA N/A 03/06/2020   Procedure: XI ROBOTIC ASSISTED VENTRAL HERNIA;  Surgeon: Jordis Laneta FALCON, MD;  Location: [facility];  Service: General;  Laterality: N/A;    Family History  Problem Relation Age of Onset   Diabetes Mother    Heart disease Father     Social History Reviewed with no changes to be made today.   Outpatient Medications Prior to Visit  Medication Sig Dispense Refill   atorvastatin  (LIPITOR) 10 MG tablet Take 1 tablet (10 mg total) by mouth daily with lunch. For cholesterol. 90 tablet 1   fluticasone  (FLONASE ) 50 MCG/ACT nasal spray Place 2 sprays into both nostrils daily. 16 g 6   losartan -hydrochlorothiazide  (HYZAAR ) 50-12.5 MG tablet Take 1 tablet by mouth daily. FOR BLOOD PRESSURE. 90 tablet 1   metFORMIN  (GLUCOPHAGE ) 500 MG tablet Take 2 tablets (1,000 mg total) by mouth daily with lunch. For Diabetes. 90 tablet 1   loratadine  (CLARITIN ) 10 MG tablet Take 1 tablet (10 mg total) by mouth daily. 90 tablet 3   amoxicillin -clavulanate (AUGMENTIN ) 875-125 MG tablet Take 1 tablet by mouth every 12 (twelve) hours.  (Patient not taking: Reported on 07/04/2024) 14 tablet 0   naproxen  (NAPROSYN ) 500 MG tablet Take 1 tablet (500 mg total) by mouth 2 (two) times daily as needed for moderate pain (pain score 4-6). For back pain (Patient not taking: Reported on 07/04/2024) 40 tablet 1   No facility-administered medications prior to visit.    No Known Allergies     Objective:    BP (!) 149/78 (BP Location: Left Arm, Patient Position: Sitting, Cuff Size: Normal)   Pulse 65   Resp 18   Ht 5' 5 (1.651 m)   Wt 211 lb 9.6 oz (96 kg)   SpO2 99%   BMI 35.21 kg/m  Wt Readings from Last 3 Encounters:  07/04/24 211 lb 9.6 oz (96 kg)  04/28/24 209 lb 10.5 oz (95.1 kg)  04/03/24 209 lb 9.6 oz (95.1 kg)    Physical Exam Vitals and nursing note reviewed.  Constitutional:      Appearance: He is well-developed.  HENT:     Head: Normocephalic and atraumatic.  Cardiovascular:     Rate and Rhythm: Normal  rate and regular rhythm.     Heart sounds: Normal heart sounds. No murmur heard.    No friction rub. No gallop.  Pulmonary:     Effort: Pulmonary effort is normal. No tachypnea or respiratory distress.     Breath sounds: Normal breath sounds. No decreased breath sounds, wheezing, rhonchi or rales.  Chest:     Chest wall: No tenderness.  Musculoskeletal:        General: Normal range of motion.     Cervical back: Normal range of motion.  Skin:    General: Skin is warm and dry.  Neurological:     Mental Status: He is alert and oriented to person, place, and time.     Coordination: Coordination normal.  Psychiatric:        Behavior: Behavior normal. Behavior is cooperative.        Thought Content: Thought content normal.        Judgment: Judgment normal.          Patient has been counseled extensively about nutrition and exercise as well as the importance of adherence with medications and regular follow-up. The patient was given clear instructions to go to ER or return to medical center if symptoms  don't improve, worsen or new problems develop. The patient verbalized understanding.   Follow-up: Return for f/u 3-4 weeks f/u cough and heel pain . OCTOBER 1st 410.   Haze LELON Servant, FNP-BC [facility] and [facility] [place], [place] [phone]   07/04/2024, 9:08 PM

## 2024-07-25 ENCOUNTER — Other Ambulatory Visit: Payer: Self-pay

## 2024-08-01 ENCOUNTER — Encounter: Payer: Self-pay | Admitting: Nurse Practitioner

## 2024-08-01 ENCOUNTER — Other Ambulatory Visit: Payer: Self-pay

## 2024-08-01 ENCOUNTER — Ambulatory Visit: Payer: Self-pay | Attending: Nurse Practitioner | Admitting: Nurse Practitioner

## 2024-08-01 VITALS — BP 133/85 | HR 75 | Resp 19 | Ht 65.0 in | Wt 212.2 lb

## 2024-08-01 DIAGNOSIS — Z7984 Long term (current) use of oral hypoglycemic drugs: Secondary | ICD-10-CM

## 2024-08-01 DIAGNOSIS — R058 Other specified cough: Secondary | ICD-10-CM

## 2024-08-01 DIAGNOSIS — Z79899 Other long term (current) drug therapy: Secondary | ICD-10-CM

## 2024-08-01 DIAGNOSIS — R0982 Postnasal drip: Secondary | ICD-10-CM

## 2024-08-01 DIAGNOSIS — J3089 Other allergic rhinitis: Secondary | ICD-10-CM

## 2024-08-01 DIAGNOSIS — G8929 Other chronic pain: Secondary | ICD-10-CM

## 2024-08-01 DIAGNOSIS — Z1211 Encounter for screening for malignant neoplasm of colon: Secondary | ICD-10-CM

## 2024-08-01 DIAGNOSIS — M79671 Pain in right foot: Secondary | ICD-10-CM

## 2024-08-01 DIAGNOSIS — R7303 Prediabetes: Secondary | ICD-10-CM

## 2024-08-01 DIAGNOSIS — I10 Essential (primary) hypertension: Secondary | ICD-10-CM

## 2024-08-01 MED ORDER — LEVOCETIRIZINE DIHYDROCHLORIDE 5 MG PO TABS
5.0000 mg | ORAL_TABLET | Freq: Every evening | ORAL | 1 refills | Status: AC
  Filled 2024-08-01: qty 90, 90d supply, fill #0

## 2024-08-01 MED ORDER — LOSARTAN POTASSIUM-HCTZ 50-12.5 MG PO TABS
1.0000 | ORAL_TABLET | Freq: Every day | ORAL | 1 refills | Status: AC
  Filled 2024-08-01 – 2024-11-06 (×3): qty 90, 90d supply, fill #0

## 2024-08-01 MED ORDER — METFORMIN HCL 500 MG PO TABS
1000.0000 mg | ORAL_TABLET | Freq: Every day | ORAL | 1 refills | Status: AC
  Filled 2024-08-01: qty 90, 45d supply, fill #0

## 2024-08-01 NOTE — Progress Notes (Signed)
 Assessment & Plan:  Audi was seen today for follow-up.  Diagnoses and all orders for this visit:  Nocturnal cough Improved continue antihistamine and steroid nasal -     levocetirizine (XYZAL ) 5 MG tablet; Take 1 tablet (5 mg total) by mouth every evening. FOR ALLERGIES  PND (post-nasal drip) -     levocetirizine (XYZAL ) 5 MG tablet; Take 1 tablet (5 mg total) by mouth every evening. FOR ALLERGIES  Environmental and seasonal allergies -     levocetirizine (XYZAL ) 5 MG tablet; Take 1 tablet (5 mg total) by mouth every evening. FOR ALLERGIES  Chronic pain of right heel -     Ambulatory referral to Podiatry Chronic heel pain persistent despite exercise adherence.  - Refer to podiatrist for evaluation and management.  Primary hypertension -     losartan -hydrochlorothiazide  (HYZAAR ) 50-12.5 MG tablet; Take 1 tablet by mouth daily. FOR BLOOD PRESSURE. -     CMP14+EGFR  Prediabetes -     metFORMIN  (GLUCOPHAGE ) 500 MG tablet; Take 2 tablets (1,000 mg total) by mouth daily with lunch. For Diabetes.  Colon cancer screening -     Fecal occult blood, imunochemical  Benign right eyelid lesion Non-painful eyelid lesion likely benign.  - Monitor lesion for changes in size or symptoms. - Re-evaluate in three months. - Refer to Derm if lesion increases in size or becomes symptomatic.  Patient has been counseled on age-appropriate routine health concerns for screening and prevention. These are reviewed and up-to-date. Referrals have been placed accordingly. Immunizations are up-to-date or declined.    Subjective:   Chief Complaint  Patient presents with   Follow-up    Cough and heel pain    Austin Rubio 48 y.o. male presents to office today for follow-up to nocturnal cough and right heel pain.  He has a past medical history of hypertension, hyperlipidemia, prediabetes, dermatofibrosarcoma of left chest   At his last visit with me 3 weeks ago he reported heel pain, specifically  in the right heel, worse in the morning upon waking. The pain sometimes extends slightly below the heel. Walking alleviated the pain temporarily, but it returned with sitting.  He also reported intermittent cough primarily noticed with lying down to sleep.  At that visit I prescribed him Xyzal  and Flonase  and we also discussed exercises for possible plantars fasciitis.  Today he returns stating cough has improved however right heel pain is still present.  He mentions a lump on his right eyelid that has been present for over six months. The lump is not painful   Review of Systems  Constitutional:  Negative for fever, malaise/fatigue and weight loss.  HENT:  Negative for nosebleeds.        See HPI  Eyes:  Positive for blurred vision. Negative for double vision and photophobia.       See HPI  Respiratory: Negative.  Negative for cough and shortness of breath.   Cardiovascular: Negative.  Negative for chest pain, palpitations and leg swelling.  Gastrointestinal: Negative.  Negative for heartburn, nausea and vomiting.  Musculoskeletal:  Negative for myalgias.       See HPI  Neurological: Negative.  Negative for dizziness, focal weakness, seizures and headaches.  Psychiatric/Behavioral: Negative.  Negative for suicidal ideas.     Past Medical History:  Diagnosis Date   Complication of anesthesia    syncope after getting home from surgery.   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    Past Surgical History:  Procedure Laterality Date   INGUINAL HERNIA REPAIR Right 2005   INGUINAL HERNIA REPAIR Left 11/10/2023   Procedure: HERNIA REPAIR INGUINAL ADULT, open;  Surgeon: Desiderio Schanz, MD;  Location: [facility];  Service: General;  Laterality: Left;   INSERTION OF MESH  11/10/2023   Procedure: INSERTION OF MESH;  Surgeon: Desiderio Schanz, MD;   Location: [facility];  Service: General;;   MELANOMA EXCISION Left 11/24/2021   Procedure: WIDE EXCISION OF LEFT CHEST SPINDLE CELL NEOPLASM;  Surgeon: Belinda Cough, MD;  Location: [facility];  Service: General;  Laterality: Left;  LMA   XI ROBOTIC ASSISTED VENTRAL HERNIA N/A 03/06/2020   Procedure: XI ROBOTIC ASSISTED VENTRAL HERNIA;  Surgeon: Jordis Laneta FALCON, MD;  Location: [facility];  Service: General;  Laterality: N/A;    Family History  Problem Relation Age of Onset   Diabetes Mother    Heart disease Father     Social History Reviewed with no changes to be made today.   Outpatient Medications Prior to Visit  Medication Sig Dispense Refill   atorvastatin  (LIPITOR) 10 MG tablet Take 1 tablet (10 mg total) by mouth daily with lunch. For cholesterol. 90 tablet 1   fluticasone  (FLONASE ) 50 MCG/ACT nasal spray Place 2 sprays into both nostrils daily. 16 g 6   levocetirizine (XYZAL ) 5 MG tablet Take 1 tablet (5 mg total) by mouth every evening. 30 tablet 0   losartan -hydrochlorothiazide  (HYZAAR ) 50-12.5 MG tablet Take 1 tablet by mouth daily. FOR BLOOD PRESSURE. 90 tablet 1   metFORMIN  (GLUCOPHAGE ) 500 MG tablet Take 2 tablets (1,000 mg total) by mouth daily with lunch. For Diabetes. 90 tablet 1   No facility-administered medications prior to visit.    No Known Allergies     Objective:    BP 133/85 (BP Location: Left Arm, Patient Position: Sitting, Cuff Size: Normal)   Pulse 75   Resp 19   Ht 5' 5 (1.651 m)   Wt 212 lb 3.2 oz (96.3 kg)   SpO2 96%   BMI 35.31 kg/m  Wt Readings from Last 3 Encounters:  08/01/24 212 lb 3.2 oz (96.3 kg)  07/04/24 211 lb 9.6 oz (96 kg)  04/28/24 209 lb 10.5 oz (95.1 kg)    Physical Exam Vitals and nursing note reviewed.  Constitutional:      Appearance: He is well-developed.  HENT:     Head: Normocephalic and atraumatic.  Eyes:     Extraocular Movements: Extraocular movements intact.     Conjunctiva/sclera: Conjunctivae  normal.   Cardiovascular:     Rate and Rhythm: Normal rate and regular rhythm.     Heart sounds: Normal heart sounds. No murmur heard.    No friction rub. No gallop.  Pulmonary:     Effort: Pulmonary effort is normal. No tachypnea or respiratory distress.     Breath sounds: Normal breath sounds. No decreased breath sounds, wheezing, rhonchi or rales.  Chest:     Chest wall: No tenderness.  Musculoskeletal:        General: Normal range of motion.     Cervical back: Normal range of motion.  Skin:    General: Skin is warm and dry.  Neurological:     Mental Status: He is alert and oriented to person, place, and time.     Coordination: Coordination normal.  Psychiatric:  Behavior: Behavior normal. Behavior is cooperative.        Thought Content: Thought content normal.        Judgment: Judgment normal.          Patient has been counseled extensively about nutrition and exercise as well as the importance of adherence with medications and regular follow-up. The patient was given clear instructions to go to ER or return to medical center if symptoms don't improve, worsen or new problems develop. The patient verbalized understanding.   Follow-up: Return in about 4 months (around 12/02/2024).   Haze LELON Servant, FNP-BC [facility] and [facility] [place], [place] [phone]   08/01/2024, 4:26 PM

## 2024-08-02 ENCOUNTER — Ambulatory Visit: Payer: Self-pay | Admitting: Nurse Practitioner

## 2024-08-02 LAB — CMP14+EGFR
ALT: 38 IU/L (ref 0–44)
AST: 31 IU/L (ref 0–40)
Albumin: 4.6 g/dL (ref 4.1–5.1)
Alkaline Phosphatase: 82 IU/L (ref 47–123)
BUN/Creatinine Ratio: 18 (ref 9–20)
BUN: 16 mg/dL (ref 6–24)
Bilirubin Total: 0.4 mg/dL (ref 0.0–1.2)
CO2: 25 mmol/L (ref 20–29)
Calcium: 9.6 mg/dL (ref 8.7–10.2)
Chloride: 100 mmol/L (ref 96–106)
Creatinine, Ser: 0.89 mg/dL (ref 0.76–1.27)
Globulin, Total: 2.6 g/dL (ref 1.5–4.5)
Glucose: 90 mg/dL (ref 70–99)
Potassium: 4.6 mmol/L (ref 3.5–5.2)
Sodium: 140 mmol/L (ref 134–144)
Total Protein: 7.2 g/dL (ref 6.0–8.5)
eGFR: 105 mL/min/1.73 (ref >59)

## 2024-08-09 ENCOUNTER — Ambulatory Visit: Payer: Self-pay | Admitting: Podiatry

## 2024-08-09 ENCOUNTER — Other Ambulatory Visit: Payer: Self-pay

## 2024-08-10 ENCOUNTER — Other Ambulatory Visit: Payer: Self-pay

## 2024-10-01 ENCOUNTER — Other Ambulatory Visit: Payer: Self-pay

## 2024-11-06 ENCOUNTER — Other Ambulatory Visit: Payer: Self-pay

## 2024-12-03 ENCOUNTER — Ambulatory Visit: Payer: Self-pay | Admitting: Nurse Practitioner

## 2024-12-28 ENCOUNTER — Ambulatory Visit: Payer: Self-pay | Admitting: Nurse Practitioner
